# Patient Record
Sex: Female | Born: 1935
Health system: Southern US, Community
[De-identification: ages and names within clinical notes are randomized; demographics above are authoritative.]

## PROBLEM LIST (undated history)

## (undated) DIAGNOSIS — M5136 Other intervertebral disc degeneration, lumbar region: Secondary | ICD-10-CM

## (undated) DIAGNOSIS — E785 Hyperlipidemia, unspecified: Secondary | ICD-10-CM

## (undated) DIAGNOSIS — K8020 Calculus of gallbladder without cholecystitis without obstruction: Secondary | ICD-10-CM

## (undated) DIAGNOSIS — K5792 Diverticulitis of intestine, part unspecified, without perforation or abscess without bleeding: Secondary | ICD-10-CM

## (undated) DIAGNOSIS — S62101A Fracture of unspecified carpal bone, right wrist, initial encounter for closed fracture: Secondary | ICD-10-CM

## (undated) DIAGNOSIS — B029 Zoster without complications: Secondary | ICD-10-CM

## (undated) DIAGNOSIS — M7120 Synovial cyst of popliteal space [Baker], unspecified knee: Secondary | ICD-10-CM

## (undated) DIAGNOSIS — M1990 Unspecified osteoarthritis, unspecified site: Secondary | ICD-10-CM

## (undated) DIAGNOSIS — K219 Gastro-esophageal reflux disease without esophagitis: Secondary | ICD-10-CM

## (undated) DIAGNOSIS — N390 Urinary tract infection, site not specified: Secondary | ICD-10-CM

## (undated) DIAGNOSIS — M51369 Other intervertebral disc degeneration, lumbar region without mention of lumbar back pain or lower extremity pain: Secondary | ICD-10-CM

## (undated) HISTORY — DX: Gastro-esophageal reflux disease without esophagitis: K21.9

## (undated) HISTORY — PX: BREAST SURGERY: SHX581

## (undated) HISTORY — PX: PARTIAL HYSTERECTOMY: SHX80

## (undated) HISTORY — PX: TRIGGER FINGER RELEASE: SHX641

## (undated) HISTORY — DX: Fracture of unspecified carpal bone, right wrist, initial encounter for closed fracture: S62.101A

## (undated) HISTORY — DX: Hyperlipidemia, unspecified: E78.5

## (undated) HISTORY — DX: Synovial cyst of popliteal space (Baker), unspecified knee: M71.20

## (undated) HISTORY — DX: Other intervertebral disc degeneration, lumbar region: M51.36

## (undated) HISTORY — DX: Unspecified osteoarthritis, unspecified site: M19.90

## (undated) HISTORY — DX: Calculus of gallbladder without cholecystitis without obstruction: K80.20

## (undated) HISTORY — PX: APPENDECTOMY: SHX54

## (undated) HISTORY — PX: OTHER SURGICAL HISTORY: SHX169

## (undated) HISTORY — DX: Other intervertebral disc degeneration, lumbar region without mention of lumbar back pain or lower extremity pain: M51.369

## (undated) HISTORY — DX: Diverticulitis of intestine, part unspecified, without perforation or abscess without bleeding: K57.92

## (undated) HISTORY — DX: Urinary tract infection, site not specified: N39.0

## (undated) HISTORY — DX: Zoster without complications: B02.9

---

## 2000-01-20 ENCOUNTER — Encounter: Admission: RE | Admit: 2000-01-20 | Discharge: 2000-01-20 | Payer: Self-pay | Admitting: Family Medicine

## 2000-01-20 ENCOUNTER — Encounter: Payer: Self-pay | Admitting: Family Medicine

## 2000-10-07 ENCOUNTER — Emergency Department (HOSPITAL_COMMUNITY): Admission: EM | Admit: 2000-10-07 | Discharge: 2000-10-07 | Payer: Self-pay | Admitting: Emergency Medicine

## 2003-05-03 ENCOUNTER — Encounter: Admission: RE | Admit: 2003-05-03 | Discharge: 2003-05-03 | Payer: Self-pay | Admitting: Family Medicine

## 2004-05-01 ENCOUNTER — Ambulatory Visit: Payer: Self-pay | Admitting: Family Medicine

## 2004-05-13 ENCOUNTER — Ambulatory Visit: Payer: Self-pay | Admitting: Family Medicine

## 2004-06-30 ENCOUNTER — Ambulatory Visit: Payer: Self-pay | Admitting: Family Medicine

## 2004-12-30 ENCOUNTER — Ambulatory Visit: Payer: Self-pay | Admitting: Family Medicine

## 2005-05-06 ENCOUNTER — Encounter: Payer: Self-pay | Admitting: Family Medicine

## 2005-05-06 LAB — CONVERTED CEMR LAB

## 2005-05-31 ENCOUNTER — Ambulatory Visit: Payer: Self-pay | Admitting: Family Medicine

## 2005-06-09 ENCOUNTER — Ambulatory Visit: Payer: Self-pay | Admitting: Family Medicine

## 2005-06-30 ENCOUNTER — Ambulatory Visit: Payer: Self-pay | Admitting: Family Medicine

## 2005-12-09 ENCOUNTER — Ambulatory Visit: Payer: Self-pay | Admitting: Family Medicine

## 2005-12-29 ENCOUNTER — Ambulatory Visit: Payer: Self-pay | Admitting: Family Medicine

## 2006-03-08 DIAGNOSIS — S62101A Fracture of unspecified carpal bone, right wrist, initial encounter for closed fracture: Secondary | ICD-10-CM

## 2006-03-08 HISTORY — DX: Fracture of unspecified carpal bone, right wrist, initial encounter for closed fracture: S62.101A

## 2006-04-06 ENCOUNTER — Ambulatory Visit: Payer: Self-pay | Admitting: Family Medicine

## 2006-04-07 ENCOUNTER — Encounter: Payer: Self-pay | Admitting: Family Medicine

## 2006-05-18 ENCOUNTER — Ambulatory Visit: Payer: Self-pay | Admitting: Family Medicine

## 2006-06-01 ENCOUNTER — Ambulatory Visit: Payer: Self-pay | Admitting: Family Medicine

## 2006-06-01 LAB — CONVERTED CEMR LAB
ALT: 12 units/L (ref 0–40)
Albumin: 3.7 g/dL (ref 3.5–5.2)
BUN: 14 mg/dL (ref 6–23)
Creatinine, Ser: 0.7 mg/dL (ref 0.4–1.2)
GFR calc Af Amer: 102 mL/min
GFR calc non Af Amer: 84 mL/min
Phosphorus: 3.6 mg/dL (ref 2.3–4.6)
Triglycerides: 208 mg/dL (ref 0–149)
VLDL: 42 mg/dL — ABNORMAL HIGH (ref 0–40)

## 2006-06-07 ENCOUNTER — Ambulatory Visit: Payer: Self-pay | Admitting: Internal Medicine

## 2006-07-19 ENCOUNTER — Encounter: Payer: Self-pay | Admitting: Family Medicine

## 2006-07-19 DIAGNOSIS — K219 Gastro-esophageal reflux disease without esophagitis: Secondary | ICD-10-CM

## 2006-07-19 DIAGNOSIS — E7800 Pure hypercholesterolemia, unspecified: Secondary | ICD-10-CM | POA: Insufficient documentation

## 2006-07-19 DIAGNOSIS — M810 Age-related osteoporosis without current pathological fracture: Secondary | ICD-10-CM

## 2006-07-19 DIAGNOSIS — M7120 Synovial cyst of popliteal space [Baker], unspecified knee: Secondary | ICD-10-CM | POA: Insufficient documentation

## 2006-07-19 DIAGNOSIS — Z872 Personal history of diseases of the skin and subcutaneous tissue: Secondary | ICD-10-CM | POA: Insufficient documentation

## 2006-07-27 ENCOUNTER — Ambulatory Visit: Payer: Self-pay | Admitting: Family Medicine

## 2006-08-08 ENCOUNTER — Ambulatory Visit: Payer: Self-pay | Admitting: Family Medicine

## 2006-08-10 ENCOUNTER — Encounter (INDEPENDENT_AMBULATORY_CARE_PROVIDER_SITE_OTHER): Payer: Self-pay | Admitting: *Deleted

## 2006-12-05 ENCOUNTER — Ambulatory Visit: Payer: Self-pay | Admitting: Family Medicine

## 2006-12-06 LAB — CONVERTED CEMR LAB
BUN: 14 mg/dL (ref 6–23)
Cholesterol: 143 mg/dL (ref 0–200)
HDL: 31.6 mg/dL — ABNORMAL LOW (ref >39.0)
Total CHOL/HDL Ratio: 4.5
Triglycerides: 207 mg/dL (ref 0–149)
VLDL: 41 mg/dL — ABNORMAL HIGH (ref 0–40)

## 2007-02-15 ENCOUNTER — Ambulatory Visit: Payer: Self-pay | Admitting: Internal Medicine

## 2007-02-15 LAB — CONVERTED CEMR LAB
Glucose, Urine, Semiquant: NEGATIVE
Ketones, urine, test strip: NEGATIVE
Nitrite: POSITIVE
Protein, U semiquant: NEGATIVE

## 2007-02-16 ENCOUNTER — Encounter: Payer: Self-pay | Admitting: Internal Medicine

## 2007-03-09 DIAGNOSIS — K5792 Diverticulitis of intestine, part unspecified, without perforation or abscess without bleeding: Secondary | ICD-10-CM

## 2007-03-09 DIAGNOSIS — K8020 Calculus of gallbladder without cholecystitis without obstruction: Secondary | ICD-10-CM

## 2007-03-09 HISTORY — DX: Diverticulitis of intestine, part unspecified, without perforation or abscess without bleeding: K57.92

## 2007-03-09 HISTORY — DX: Calculus of gallbladder without cholecystitis without obstruction: K80.20

## 2007-04-02 ENCOUNTER — Encounter: Payer: Self-pay | Admitting: Family Medicine

## 2007-04-02 ENCOUNTER — Emergency Department (HOSPITAL_COMMUNITY): Admission: EM | Admit: 2007-04-02 | Discharge: 2007-04-03 | Payer: Self-pay | Admitting: Emergency Medicine

## 2007-04-10 ENCOUNTER — Ambulatory Visit: Payer: Self-pay | Admitting: Family Medicine

## 2007-04-10 DIAGNOSIS — K8020 Calculus of gallbladder without cholecystitis without obstruction: Secondary | ICD-10-CM | POA: Insufficient documentation

## 2007-04-10 DIAGNOSIS — K5730 Diverticulosis of large intestine without perforation or abscess without bleeding: Secondary | ICD-10-CM | POA: Insufficient documentation

## 2007-04-11 LAB — CONVERTED CEMR LAB
Basophils Relative: 0 % (ref 0.0–1.0)
Eosinophils Absolute: 0.1 10*3/uL (ref 0.0–0.6)
Eosinophils Relative: 0.9 % (ref 0.0–5.0)
HCT: 39.5 % (ref 36.0–46.0)
Hemoglobin: 12.8 g/dL (ref 12.0–15.0)
Lymphocytes Relative: 19.4 % (ref 12.0–46.0)
MCV: 93.9 fL (ref 78.0–100.0)
Neutro Abs: 6 10*3/uL (ref 1.4–7.7)
Neutrophils Relative %: 70.8 % (ref 43.0–77.0)
WBC: 8.4 10*3/uL (ref 4.5–10.5)

## 2007-09-21 ENCOUNTER — Ambulatory Visit: Payer: Self-pay | Admitting: Family Medicine

## 2007-09-21 LAB — CONVERTED CEMR LAB
Nitrite: POSITIVE
Protein, U semiquant: NEGATIVE
Urobilinogen, UA: 0.2

## 2007-09-25 LAB — CONVERTED CEMR LAB
Albumin: 4 g/dL (ref 3.5–5.2)
Alkaline Phosphatase: 56 units/L (ref 39–117)
CO2: 28 meq/L (ref 19–32)
Chloride: 102 meq/L (ref 96–112)
Cholesterol: 164 mg/dL (ref 0–200)
Creatinine, Ser: 0.8 mg/dL (ref 0.4–1.2)
GFR calc Af Amer: 87 mL/min
GFR calc non Af Amer: 72 mL/min
LDL Cholesterol: 96 mg/dL (ref 0–99)
Potassium: 4.4 meq/L (ref 3.5–5.1)
TSH: 1.2 microintl units/mL (ref 0.35–5.50)
Total CHOL/HDL Ratio: 4.9
Total Protein: 6.8 g/dL (ref 6.0–8.3)
Triglycerides: 175 mg/dL — ABNORMAL HIGH (ref 0–149)
VLDL: 35 mg/dL (ref 0–40)
Vit D, 1,25-Dihydroxy: 33 (ref 30–89)

## 2007-10-02 ENCOUNTER — Encounter: Admission: RE | Admit: 2007-10-02 | Discharge: 2007-10-02 | Payer: Self-pay | Admitting: Family Medicine

## 2008-04-23 ENCOUNTER — Ambulatory Visit: Payer: Self-pay | Admitting: Family Medicine

## 2008-04-26 ENCOUNTER — Encounter (INDEPENDENT_AMBULATORY_CARE_PROVIDER_SITE_OTHER): Payer: Self-pay | Admitting: *Deleted

## 2008-04-26 LAB — CONVERTED CEMR LAB
Albumin: 3.9 g/dL (ref 3.5–5.2)
Basophils Absolute: 0 10*3/uL (ref 0.0–0.1)
Calcium: 9.5 mg/dL (ref 8.4–10.5)
Cholesterol: 186 mg/dL (ref 0–200)
Eosinophils Absolute: 0.1 10*3/uL (ref 0.0–0.7)
GFR calc non Af Amer: 72 mL/min
Glucose, Bld: 94 mg/dL (ref 70–99)
HCT: 38.7 % (ref 36.0–46.0)
Hemoglobin: 13.2 g/dL (ref 12.0–15.0)
MCHC: 34.2 g/dL (ref 30.0–36.0)
MCV: 93.5 fL (ref 78.0–100.0)
Monocytes Absolute: 0.5 10*3/uL (ref 0.1–1.0)
Monocytes Relative: 7.6 % (ref 3.0–12.0)
Neutro Abs: 4.2 10*3/uL (ref 1.4–7.7)
Phosphorus: 3.7 mg/dL (ref 2.3–4.6)
Platelets: 185 10*3/uL (ref 150–400)
Potassium: 4.6 meq/L (ref 3.5–5.1)
RDW: 11.7 % (ref 11.5–14.6)
Sodium: 144 meq/L (ref 135–145)
Triglycerides: 223 mg/dL (ref 0–149)
Vit D, 25-Hydroxy: 28 ng/mL — ABNORMAL LOW (ref 30–89)

## 2008-07-08 ENCOUNTER — Ambulatory Visit: Payer: Self-pay | Admitting: Family Medicine

## 2008-10-02 ENCOUNTER — Ambulatory Visit: Payer: Self-pay | Admitting: Family Medicine

## 2008-10-02 DIAGNOSIS — I10 Essential (primary) hypertension: Secondary | ICD-10-CM

## 2008-10-03 LAB — CONVERTED CEMR LAB
ALT: 11 units/L (ref 0–35)
BUN: 14 mg/dL (ref 6–23)
CO2: 31 meq/L (ref 19–32)
Calcium: 9.1 mg/dL (ref 8.4–10.5)
Creatinine, Ser: 0.7 mg/dL (ref 0.4–1.2)
Glucose, Bld: 110 mg/dL — ABNORMAL HIGH (ref 70–99)
HDL: 37.1 mg/dL — ABNORMAL LOW (ref >39.00)
Total CHOL/HDL Ratio: 4
Triglycerides: 192 mg/dL — ABNORMAL HIGH (ref 0.0–149.0)
VLDL: 38.4 mg/dL (ref 0.0–40.0)

## 2008-10-08 ENCOUNTER — Ambulatory Visit: Payer: Self-pay | Admitting: Family Medicine

## 2008-10-08 DIAGNOSIS — E559 Vitamin D deficiency, unspecified: Secondary | ICD-10-CM

## 2008-12-02 ENCOUNTER — Ambulatory Visit: Payer: Self-pay | Admitting: Family Medicine

## 2008-12-02 LAB — CONVERTED CEMR LAB
Bilirubin Urine: NEGATIVE
Epithelial cells, urine: 1 /lpf
Ketones, urine, test strip: NEGATIVE
RBC / HPF: 0
Urobilinogen, UA: 0.2

## 2009-01-17 ENCOUNTER — Ambulatory Visit: Payer: Self-pay | Admitting: Family Medicine

## 2009-01-17 LAB — CONVERTED CEMR LAB
Nitrite: NEGATIVE
Urobilinogen, UA: 0.2
pH: 6

## 2009-03-25 ENCOUNTER — Ambulatory Visit: Payer: Self-pay | Admitting: Family Medicine

## 2009-03-25 LAB — CONVERTED CEMR LAB
Glucose, Urine, Semiquant: NEGATIVE
Ketones, urine, test strip: NEGATIVE
Urobilinogen, UA: 0.2
pH: 6

## 2009-03-26 ENCOUNTER — Encounter: Payer: Self-pay | Admitting: Family Medicine

## 2009-03-27 DIAGNOSIS — N390 Urinary tract infection, site not specified: Secondary | ICD-10-CM

## 2009-04-03 ENCOUNTER — Ambulatory Visit: Payer: Self-pay | Admitting: Family Medicine

## 2009-04-04 ENCOUNTER — Telehealth: Payer: Self-pay | Admitting: Family Medicine

## 2009-04-04 LAB — CONVERTED CEMR LAB
ALT: 13 units/L (ref 0–35)
AST: 18 units/L (ref 0–37)
BUN: 13 mg/dL (ref 6–23)
CO2: 30 meq/L (ref 19–32)
Chloride: 105 meq/L (ref 96–112)
GFR calc non Af Amer: 71.54 mL/min (ref >60)
Total CHOL/HDL Ratio: 4
Triglycerides: 183 mg/dL — ABNORMAL HIGH (ref 0.0–149.0)
Vit D, 25-Hydroxy: 29 ng/mL — ABNORMAL LOW (ref 30–89)

## 2009-04-15 ENCOUNTER — Encounter: Payer: Self-pay | Admitting: Family Medicine

## 2009-04-30 ENCOUNTER — Ambulatory Visit: Payer: Self-pay | Admitting: Family Medicine

## 2009-06-16 ENCOUNTER — Encounter: Payer: Self-pay | Admitting: Family Medicine

## 2009-10-14 ENCOUNTER — Ambulatory Visit: Payer: Self-pay | Admitting: Family Medicine

## 2009-10-17 ENCOUNTER — Encounter: Payer: Self-pay | Admitting: Family Medicine

## 2009-10-17 LAB — CONVERTED CEMR LAB
AST: 15 units/L (ref 0–37)
Albumin: 4.1 g/dL (ref 3.5–5.2)
Basophils Relative: 0.5 % (ref 0.0–3.0)
CO2: 27 meq/L (ref 19–32)
Calcium: 9.5 mg/dL (ref 8.4–10.5)
Chloride: 102 meq/L (ref 96–112)
Eosinophils Absolute: 0.1 10*3/uL (ref 0.0–0.7)
HDL: 36.9 mg/dL — ABNORMAL LOW (ref >39.00)
Hemoglobin: 12.4 g/dL (ref 12.0–15.0)
MCHC: 33.9 g/dL (ref 30.0–36.0)
MCV: 94 fL (ref 78.0–100.0)
Monocytes Absolute: 0.5 10*3/uL (ref 0.1–1.0)
Neutro Abs: 4.9 10*3/uL (ref 1.4–7.7)
Potassium: 4.5 meq/L (ref 3.5–5.1)
RBC: 3.88 M/uL (ref 3.87–5.11)
Vit D, 25-Hydroxy: 29 ng/mL — ABNORMAL LOW (ref 30–89)

## 2009-10-20 ENCOUNTER — Encounter: Payer: Self-pay | Admitting: Family Medicine

## 2009-12-17 ENCOUNTER — Ambulatory Visit: Payer: Self-pay | Admitting: Family Medicine

## 2009-12-24 LAB — CONVERTED CEMR LAB: Vit D, 25-Hydroxy: 46 ng/mL (ref 30–89)

## 2010-01-16 ENCOUNTER — Ambulatory Visit: Payer: Self-pay | Admitting: Family Medicine

## 2010-01-16 DIAGNOSIS — L299 Pruritus, unspecified: Secondary | ICD-10-CM | POA: Insufficient documentation

## 2010-01-19 LAB — CONVERTED CEMR LAB
AST: 17 units/L (ref 0–37)
Albumin: 4.1 g/dL (ref 3.5–5.2)
Alkaline Phosphatase: 58 units/L (ref 39–117)
Bilirubin, Direct: 0.1 mg/dL (ref 0.0–0.3)
CO2: 30 meq/L (ref 19–32)
GFR calc non Af Amer: 55.85 mL/min (ref >60)
Glucose, Bld: 94 mg/dL (ref 70–99)
Potassium: 4.2 meq/L (ref 3.5–5.1)
Sodium: 140 meq/L (ref 135–145)

## 2010-03-08 HISTORY — PX: CHOLECYSTECTOMY: SHX55

## 2010-04-07 NOTE — Assessment & Plan Note (Signed)
Summary: itching all over/alc   Vital Signs:  Patient profile:   77 year old female Weight:      141.75 pounds Temp:     98.1 degrees F oral Pulse rate:   80 / minute Pulse rhythm:   regular BP sitting:   122 / 80  (left arm) Cuff size:   regular  Vitals Entered By: [name] CMA (AAMA) (January 16, 2010 11:13 AM) CC: Itching all over   History of Present Illness: CC: itching  3d h/o bad case of itching.  Itching all over, when scratches skin turns red.  Has had nettle rash in past (after something she ate).  Eating tomaoes, yogurt, apples, bananas.  Took tylenol allergy med last night and slept better.  took cortisone ointment as well which helped.  Took hot shower this morning, hasn't itched as bad since.  itching was arms, legs, back, chest, and abd.  spared face, feet, fingers  Not outside in poison ivy recently.  No new lotions, detergents, soaps.  Simvastatin 80mg  every other day for long time.  Current Medications (verified): 1)  Zocor 80 Mg Tabs (Simvastatin) .... Take One By Mouth Daily 2)  Aspirin 81 Mg Tbec (Aspirin) .... Take One By Mouth Daily 3)  Tylenol Extra Strength 500 Mg Tabs (Acetaminophen) .... Take By Mouth As Directed As Needed 4)  Optivar 0.05 %  Soln (Azelastine Hcl) .... One Drop Each Eye Twice A Day 5)  Vitamin D 2000 Unit Tabs (Cholecalciferol) .... Take 1 Tablet By Mouth Daily  Allergies: 1)  Penicillin 2)  Sulfa 3)  Quinine 4)  Fosamax  Past History:  Past Medical History: Last updated: 04/10/2007 GERD Osteoarthritis- hands, knees Osteoporosis Rheumatoid arthritis- ? fx wrist R 08 diverticulitis 09 gallstones (incidental) 09  Past Surgical History: Last updated: 04/10/2007 appy R wrist fx 08- no surgery CT abd /pelvis gallstones and sigmoid diverticulitis 1/09  Social History: Last updated: 09/21/2007 Marital Status: married  Children:  Occupation:  non smoker no alcohol  Review of Systems       per HPI  Physical  Exam  General:  well appearing elderly female Mouth:  pharynx pink and moist.   Abdomen:  abdomen soft and non-tender without masses, organomegaly or hernias noted. no renal bruits  Skin:  pruritic macular erythematous rash on back from iliac crest to braline.  + pruritic hive R lateral thigh   Impression & Recommendations:  Problem # 1:  PRURITUS (ICD-698.9) ? contact rash vs systemic.  check liver and T bili as on zocor.  treat with allergy med (claritin/zyrtec) and benadryl as needed,continue topical steroid.  rtc if not improving.   doubt xerosis as improved after hot shower.  thyroid normal 10/2009.  no changes of lichen simplex chornicus  Orders: TLB-BMP (Basic Metabolic Panel-BMET) (80048-METABOL) TLB-Hepatic/Liver Function Pnl (80076-HEPATIC)  Complete Medication List: 1)  Zocor 80 Mg Tabs (Simvastatin) .... Take one by mouth daily 2)  Aspirin 81 Mg Tbec (Aspirin) .... Take one by mouth daily 3)  Tylenol Extra Strength 500 Mg Tabs (Acetaminophen) .... Take by mouth as directed as needed 4)  Optivar 0.05 % Soln (Azelastine hcl) .... One drop each eye twice a day 5)  Vitamin D 2000 Unit Tabs (Cholecalciferol) .... Take 1 tablet by mouth daily  Patient Instructions: 1)  Continue tyelnol allergy, try benadryl as well as cortisone topical. 2)  Check liver function again today. 3)  Continue hot shower as long as it's not too drying.   Orders  Added: 1)  TLB-BMP (Basic Metabolic Panel-BMET) [80048-METABOL] 2)  TLB-Hepatic/Liver Function Pnl [80076-HEPATIC] 3)  Est. Patient Level III [16109]    Current Allergies (reviewed today): PENICILLIN SULFA QUININE FOSAMAX

## 2010-04-07 NOTE — Assessment & Plan Note (Signed)
Summary: 4:15 ?UTI/CLE   Vital Signs:  Patient profile:   77 year old female Weight:      144 pounds Temp:     97.9 degrees F oral Pulse rate:   68 / minute Pulse rhythm:   regular BP sitting:   124 / 70  (left arm) Cuff size:   regular  Vitals Entered By: Lowella Petties CMA (March 25, 2009 4:36 PM) CC: ? UTI   History of Present Illness: thinks she had uti had upset stomach and diarrhea yesterday eve-- with many bms  used a bedside commode  urine was very smelly and looked somewhat red -- got better as the day goes on  mild low back pain  no burning / but has some frequency  last night - brief nausea but no vomiting  no more diarrhea today   now is trying to make herself drink more water   Allergies: 1)  Penicillin 2)  Sulfa 3)  Quinine 4)  Fosamax  Past History:  Past Medical History: Last updated: 05-May-2007 GERD Osteoarthritis- hands, knees Osteoporosis Rheumatoid arthritis- ? fx wrist R 08 diverticulitis 09 gallstones (incidental) 09  Past Surgical History: Last updated: 05-05-2007 appy R wrist fx 08- no surgery CT abd /pelvis gallstones and sigmoid diverticulitis 1/09  Family History: Last updated: May 05, 2007 Father: died of CAD at 2 Mother:  Siblings:  2 B died of CAD sister diverticulitis  Social History: Last updated: 09/21/2007 Marital Status: married  Children:  Occupation:  non smoker no alcohol  Risk Factors: Smoking Status: never (07/19/2006)  Review of Systems General:  Complains of fatigue; denies fever, loss of appetite, and malaise. Eyes:  Denies blurring. CV:  Denies chest pain or discomfort and palpitations. Resp:  Denies cough and wheezing. GI:  Denies bloody stools, dark tarry stools, indigestion, and vomiting. GU:  Complains of urinary frequency; denies hematuria. MS:  Complains of low back pain. Derm:  Denies itching, lesion(s), and rash.  Physical Exam  General:  Well-developed,well-nourished,in no acute  distress; alert,appropriate and cooperative throughout examination Head:  normocephalic, atraumatic, and no abnormalities observed.   Mouth:  pharynx pink and moist.   Neck:  No deformities, masses, or tenderness noted. Lungs:  Normal respiratory effort, chest expands symmetrically. Lungs are clear to auscultation, no crackles or wheezes. Heart:  Normal rate and regular rhythm. S1 and S2 normal without gallop, murmur, click, rub or other extra sounds. Abdomen:  no CVA or suprapubic tendernes to palp Msk:  no CVA tenderness  Extremities:  No clubbing, cyanosis, edema, or deformity noted with normal full range of motion of all joints.   Skin:  Intact without suspicious lesions or rashes Cervical Nodes:  No lymphadenopathy noted Inguinal Nodes:  No significant adenopathy Psych:  normal affect, talkative and pleasant    Impression & Recommendations:  Problem # 1:  UTI (ICD-599.0) Assessment Deteriorated urine infection symptoms- this seems recurrent -- 3rd one in 4 mo  disc imp water intake  tx with cipro  pend cx may consider urol eval  The following medications were removed from the medication list:    Keflex 500 Mg Caps (Cephalexin) .Marland Kitchen... Take 1 two times a day for uti Her updated medication list for this problem includes:    Cipro 250 Mg Tabs (Ciprofloxacin hcl) .Marland Kitchen... 1 by mouth two times a day for 7 days  Orders: T-Culture, Urine (37628-31517) UA Dipstick W/ Micro (manual) (61607)  Complete Medication List: 1)  Zocor 80 Mg Tabs (Simvastatin) .... Take one by  mouth daily 2)  Aspirin 81 Mg Tbec (Aspirin) .... Take one by mouth daily 3)  One-a-day Womens Tabs (Multiple vitamins-calcium) .... Take 1 tablet by mouth once a day 4)  Tylenol Extra Strength 500 Mg Tabs (Acetaminophen) .... Take by mouth as directed as needed 5)  Optivar 0.05 % Soln (Azelastine hcl) .... One drop each eye twice a day 6)  Vitamin D 2000 Unit Tabs (Cholecalciferol) .... Take 1 tablet by mouth once a  day 7)  Caltrate  .... As directed 8)  Cipro 250 Mg Tabs (Ciprofloxacin hcl) .Marland Kitchen.. 1 by mouth two times a day for 7 days  Patient Instructions: 1)  drink lots of water  2)  start cipro today -- first dose when you get it  3)  continue drinking lots of water 4)  call or seek care is symptoms don't improve in 2-3 days or if you develop back pain, nausea, or vomiting  5)  I will update you when urine culture returns 6)  I may refer you to a urologist for frequent infection  Prescriptions: CIPRO 250 MG TABS (CIPROFLOXACIN HCL) 1 by mouth two times a day for 7 days  #14 x 0   Entered and Authorized by:   Judith Part MD   Signed by:   Judith Part MD on 03/25/2009   Method used:   Print then Give to Patient   RxID:   [id number]   Prior Medications (reviewed today): ZOCOR 80 MG TABS (SIMVASTATIN) Take one by mouth daily ASPIRIN 81 MG TBEC (ASPIRIN) Take one by mouth daily ONE-A-DAY WOMENS  TABS (MULTIPLE VITAMINS-CALCIUM) Take 1 tablet by mouth once a day TYLENOL EXTRA STRENGTH 500 MG TABS (ACETAMINOPHEN) Take by mouth as directed as needed OPTIVAR 0.05 %  SOLN (AZELASTINE HCL) one drop each eye twice a day VITAMIN D 2000 UNIT TABS (CHOLECALCIFEROL) Take 1 tablet by mouth once a day CALTRATE () as directed CIPRO 250 MG TABS (CIPROFLOXACIN HCL) 1 by mouth two times a day for 7 days Current Allergies: PENICILLIN SULFA QUININE FOSAMAX  Laboratory Results   Urine Tests  Date/Time Received: March 25, 2009 4:36 PM  Date/Time Reported: March 25, 2009 4:36 PM   Routine Urinalysis   Color: yellow Appearance: Clear Glucose: negative   (Normal Range: Negative) Bilirubin: negative   (Normal Range: Negative) Ketone: negative   (Normal Range: Negative) Spec. Gravity: <1.005   (Normal Range: 1.003-1.035) Blood: trace-intact   (Normal Range: Negative) pH: 6.0   (Normal Range: 5.0-8.0) Protein: trace   (Normal Range: Negative) Urobilinogen: 0.2   (Normal Range:  0-1) Nitrite: negative   (Normal Range: Negative) Leukocyte Esterace: trace   (Normal Range: Negative)

## 2010-04-07 NOTE — Miscellaneous (Signed)
Summary: med list update  Medications Added VITAMIN D 2000 UNIT TABS (CHOLECALCIFEROL) Take 1 tablet by mouth twice a day       Clinical Lists Changes  Medications: Changed medication from VITAMIN D 2000 UNIT TABS (CHOLECALCIFEROL) Take 1 tablet by mouth once a day to VITAMIN D 2000 UNIT TABS (CHOLECALCIFEROL) Take 1 tablet by mouth twice a day     Current Allergies: PENICILLIN SULFA QUININE FOSAMAX

## 2010-04-07 NOTE — Progress Notes (Signed)
Summary: regarding vitamin d  Phone Note Outgoing Call   Summary of Call: Advised pt of vit d results, she says she is taking 500 units two times a day.  Initial call taken by: Lowella Petties CMA,  April 04, 2009 3:22 PM  Follow-up for Phone Call        thanks for the update - I want her to increase to 1000 international units two times a day -- thanks Follow-up by: Judith Part MD,  April 04, 2009 4:33 PM  Additional Follow-up for Phone Call Additional follow up Details #1::        Advised pt. Additional Follow-up by: Lowella Petties CMA,  April 07, 2009 9:49 AM

## 2010-04-07 NOTE — Assessment & Plan Note (Signed)
Summary: CPX   Vital Signs:  Patient profile:   77 year old female Height:      60.5 inches Weight:      140.50 pounds BMI:     27.09 Temp:     98.5 degrees F oral Pulse rate:   72 / minute Pulse rhythm:   regular BP sitting:   120 / 82  (left arm) Cuff size:   regular  Vitals Entered By: Linde Gillis CMA Duncan Dull) (October 14, 2009 10:37 AM) CC: 30 minute exam   History of Present Illness: here for check up of her chronic med problems   has been feeling ok and keeping busy  works at her own pace   wt is down 4 lb eating a little less - and drinks a lot of water   bp is 120/82  OP dexa 09 on ca and D last D level 29 in jan - needs to re check is on 2000 international units per day now  does not want further dexas at this point   decline mam and pap and breast exam  no lumps on self exam  Td 04 ? pneumovax-not interested  ? shingles vaccine -- not interested   lipids were good in jan with LDL in low 100s - on zocor    Contraindications/Deferment of Procedures/Staging:    Test/Procedure: Pneumovax vaccine    Reason for deferment: patient declined     Test/Procedure: Zoster vaccine    Reason for deferment: declined   Allergies: 1)  Penicillin 2)  Sulfa 3)  Quinine 4)  Fosamax  Past History:  Past Medical History: Last updated: 2007-04-22 GERD Osteoarthritis- hands, knees Osteoporosis Rheumatoid arthritis- ? fx wrist R 08 diverticulitis 09 gallstones (incidental) 09  Past Surgical History: Last updated: 04/22/07 appy R wrist fx 08- no surgery CT abd /pelvis gallstones and sigmoid diverticulitis 1/09  Family History: Last updated: 04/22/07 Father: died of CAD at 57 Mother:  Siblings:  2 B died of CAD sister diverticulitis  Social History: Last updated: 09/21/2007 Marital Status: married  Children:  Occupation:  non smoker no alcohol  Risk Factors: Smoking Status: never (07/19/2006)  Review of Systems General:  Denies  fatigue and malaise. Eyes:  Denies blurring and eye irritation. CV:  Denies chest pain or discomfort, lightheadness, and palpitations. Resp:  Denies cough, shortness of breath, and wheezing. GI:  Denies abdominal pain, change in bowel habits, indigestion, and nausea. GU:  Denies abnormal vaginal bleeding, discharge, and dysuria. MS:  Complains of joint pain and stiffness; denies cramps and muscle weakness. Derm:  Denies itching, lesion(s), poor wound healing, and rash. Neuro:  Denies memory loss, numbness, tingling, and weakness. Psych:  Denies depression. Endo:  Denies cold intolerance and excessive thirst. Heme:  Denies abnormal bruising and bleeding.  Physical Exam  General:  well appearing elderly female with runny nose  Head:  normocephalic, atraumatic, and no abnormalities observed.   Eyes:  vision grossly intact, pupils equal, pupils round, and pupils reactive to light.  no conjunctival pallor, injection or icterus  Ears:  R ear normal and L ear normal.   Nose:  no nasal discharge.   Mouth:  pharynx pink and moist.   Neck:  supple with full rom and no masses or thyromegally, no JVD or carotid bruit  Chest Wall:  No deformities, masses, or tenderness noted. Lungs:  Normal respiratory effort, chest expands symmetrically. Lungs are clear to auscultation, no crackles or wheezes. Heart:  Normal rate and regular rhythm.  S1 and S2 normal without gallop, murmur, click, rub or other extra sounds. Abdomen:  Bowel sounds positive,abdomen soft and non-tender without masses, organomegaly or hernias noted. no renal bruits  Msk:  No deformity or scoliosis noted of thoracic or lumbar spine.  some changes of OA  Pulses:  R and L carotid,radial,femoral,dorsalis pedis and posterior tibial pulses are full and equal bilaterally Extremities:  No clubbing, cyanosis, edema, or deformity noted with normal full range of motion of all joints.   Neurologic:  sensation intact to light touch, gait normal, and  DTRs symmetrical and normal.  no tremor  Skin:  Intact without suspicious lesions or rashes Cervical Nodes:  No lymphadenopathy noted Inguinal Nodes:  No significant adenopathy Psych:  normal affect, talkative and pleasant    Impression & Recommendations:  Problem # 1:  UNSPECIFIED VITAMIN D DEFICIENCY (ICD-268.9) Assessment Unchanged check D level now on 2000 international units per day Orders: Venipuncture (16109) TLB-Lipid Panel (80061-LIPID) TLB-Renal Function Panel (80069-RENAL) TLB-CBC Platelet - w/Differential (85025-CBCD) TLB-TSH (Thyroid Stimulating Hormone) (84443-TSH) TLB-ALT (SGPT) (84460-ALT) TLB-AST (SGOT) (84450-SGOT) T-Vitamin D (25-Hydroxy) (60454-09811) Specimen Handling (91478)  Problem # 2:  ESSENTIAL HYPERTENSION, BENIGN (ICD-401.1) Assessment: Unchanged  good control without meds at this time  urged to continue low sodium diet and stay active Orders: Venipuncture (29562) TLB-Lipid Panel (80061-LIPID) TLB-Renal Function Panel (80069-RENAL) TLB-CBC Platelet - w/Differential (85025-CBCD) TLB-TSH (Thyroid Stimulating Hormone) (84443-TSH) TLB-ALT (SGPT) (84460-ALT) TLB-AST (SGOT) (84450-SGOT)  BP today: 120/82 Prior BP: 108/68 (04/30/2009)  Labs Reviewed: K+: 4.4 (04/03/2009) Creat: : 0.8 (04/03/2009)   Chol: 185 (04/03/2009)   HDL: 47.10 (04/03/2009)   LDL: 101 (04/03/2009)   TG: 183.0 (04/03/2009)  Problem # 3:  Hx of HYPERCHOLESTEROLEMIA (ICD-272.0) Assessment: Unchanged  check lab today on zocor  rev low sat fat diet- doing well  Her updated medication list for this problem includes:    Zocor 80 Mg Tabs (Simvastatin) .Marland Kitchen... Take one by mouth daily  Orders: Venipuncture (13086) TLB-Lipid Panel (80061-LIPID) TLB-Renal Function Panel (80069-RENAL) TLB-CBC Platelet - w/Differential (85025-CBCD) TLB-TSH (Thyroid Stimulating Hormone) (84443-TSH) TLB-ALT (SGPT) (84460-ALT) TLB-AST (SGOT) (84450-SGOT)  Labs Reviewed: SGOT: 18 (04/03/2009)    SGPT: 13 (04/03/2009)   HDL:47.10 (04/03/2009), 37.10 (10/02/2008)  LDL:101 (04/03/2009), 77 (57/84/6962)  Chol:185 (04/03/2009), 152 (10/02/2008)  Trig:183.0 (04/03/2009), 192.0 (10/02/2008)  Problem # 4:  OSTEOPOROSIS (ICD-733.00) Assessment: Unchanged pt declines another dexa safety issues discussed rev ca and D req Her updated medication list for this problem includes:    Vitamin D 2000 Unit Tabs (Cholecalciferol) .Marland Kitchen... Take 1 tablet by mouth once a day  Orders: Venipuncture (95284) TLB-Lipid Panel (80061-LIPID) TLB-Renal Function Panel (80069-RENAL) TLB-CBC Platelet - w/Differential (85025-CBCD) TLB-TSH (Thyroid Stimulating Hormone) (84443-TSH) TLB-ALT (SGPT) (84460-ALT) TLB-AST (SGOT) (84450-SGOT) T-Vitamin D (25-Hydroxy) (13244-01027)  Problem # 5:  Preventive Health Care (ICD-V70.0) Assessment: Comment Only declines most prev care at her age including pneumovax and shingles vaccine  Complete Medication List: 1)  Zocor 80 Mg Tabs (Simvastatin) .... Take one by mouth daily 2)  Aspirin 81 Mg Tbec (Aspirin) .... Take one by mouth daily 3)  One-a-day Womens Tabs (Multiple vitamins-calcium) .... Take 1 tablet by mouth once a day 4)  Tylenol Extra Strength 500 Mg Tabs (Acetaminophen) .... Take by mouth as directed as needed 5)  Optivar 0.05 % Soln (Azelastine hcl) .... One drop each eye twice a day 6)  Vitamin D 2000 Unit Tabs (Cholecalciferol) .... Take 1 tablet by mouth once a day 7)  Caltrate  .... As directed  Patient  Instructions: 1)  labs today for wellness and vitamin D and cholesterol  2)  keep up the good healthy diet and stay active  3)  no change in medicines  Prescriptions: ZOCOR 80 MG TABS (SIMVASTATIN) Take one by mouth daily  #30 x 11   Entered and Authorized by:   Judith Part MD   Signed by:   Judith Part MD on 10/14/2009   Method used:   Print then Give to Patient   RxID:   [id number]   Current Allergies (reviewed  today): PENICILLIN SULFA QUININE FOSAMAX

## 2010-04-07 NOTE — Consult Note (Signed)
Summary: [facility]  [facility]   Imported By: Lanelle Bal 04/21/2009 08:08:48  _____________________________________________________________________  External Attachment:    Type:   Image     Comment:   External Document

## 2010-04-07 NOTE — Assessment & Plan Note (Signed)
Summary: CONGESTION/HEADACHE/DLO   Vital Signs:  Patient profile:   77 year old female Height:      60.5 inches Weight:      144.25 pounds Temp:     98.3 degrees F oral Pulse rate:   76 / minute Pulse rhythm:   regular BP sitting:   108 / 68  (left arm) Cuff size:   regular  Vitals Entered By: Lewanda Rife LPN (April 30, 2009 11:56 AM)  History of Present Illness: started getting sick on sat eve- sneeze and runny nose  no fever  now is miserable with head congestion and pain in her temples  nasal d/c is clear  no cough - but does sneeze a lot  dry mouth  vics 44 med helps   no n/v/d at all no appetite   is on macrobid from her urologist  has raw looking spot on throat -- is wondering about thrush   Allergies: 1)  Penicillin 2)  Sulfa 3)  Quinine 4)  Fosamax  Past History:  Past Medical History: Last updated: 04/18/07 GERD Osteoarthritis- hands, knees Osteoporosis Rheumatoid arthritis- ? fx wrist R 08 diverticulitis 09 gallstones (incidental) 09  Past Surgical History: Last updated: 2007/04/18 appy R wrist fx 08- no surgery CT abd /pelvis gallstones and sigmoid diverticulitis 1/09  Family History: Last updated: 2007-04-18 Father: died of CAD at 33 Mother:  Siblings:  2 B died of CAD sister diverticulitis  Social History: Last updated: 09/21/2007 Marital Status: married  Children:  Occupation:  non smoker no alcohol  Risk Factors: Smoking Status: never (07/19/2006)  Review of Systems General:  Complains of fatigue and malaise; denies chills and fever. Eyes:  Denies blurring, discharge, and eye irritation. CV:  Denies chest pain or discomfort and palpitations. Resp:  Complains of cough; denies pleuritic, shortness of breath, and wheezing. GI:  Denies abdominal pain, nausea, and vomiting. Derm:  Denies rash.  Physical Exam  General:  well appearing elderly female with runny nose  Head:  normocephalic, atraumatic, and no  abnormalities observed.  no sinus tenderness  Eyes:  vision grossly intact, pupils equal, pupils round, pupils reactive to light, and no injection.   Ears:  R ear normal and L ear normal.   Nose:  nares are injected with clear rhinorrhea bilat  Mouth:  pharynx pink and moist, no erythema, and no exudates.   .5 cm area of irritation on L side of tougue but no signs of thrush  Neck:  No deformities, masses, or tenderness noted. Lungs:  Normal respiratory effort, chest expands symmetrically. Lungs are clear to auscultation, no crackles or wheezes. Heart:  Normal rate and regular rhythm. S1 and S2 normal without gallop, murmur, click, rub or other extra sounds. Skin:  Intact without suspicious lesions or rashes Cervical Nodes:  No lymphadenopathy noted Psych:  normal affect, talkative and pleasant    Impression & Recommendations:  Problem # 1:  URI (ICD-465.9) Assessment New viral uri with nasal symptoms and no fever - relatively mild spot on tougue looks like irritation / possible burn or early apthous ulcer (not thrush) - recommended chloraseptic and observation for that  recommend sympt care- see pt instructions   pt advised to update me if symptoms worsen or do not improve - esp if worse facial pain or any cough Her updated medication list for this problem includes:    Aspirin 81 Mg Tbec (Aspirin) .Marland Kitchen... Take one by mouth daily    Tylenol Extra Strength 500 Mg Tabs (Acetaminophen) .Marland KitchenMarland KitchenMarland KitchenMarland Kitchen  Take by mouth as directed as needed  Complete Medication List: 1)  Zocor 80 Mg Tabs (Simvastatin) .... Take one by mouth daily 2)  Aspirin 81 Mg Tbec (Aspirin) .... Take one by mouth daily 3)  One-a-day Womens Tabs (Multiple vitamins-calcium) .... Take 1 tablet by mouth once a day 4)  Tylenol Extra Strength 500 Mg Tabs (Acetaminophen) .... Take by mouth as directed as needed 5)  Optivar 0.05 % Soln (Azelastine hcl) .... One drop each eye twice a day 6)  Vitamin D 2000 Unit Tabs (Cholecalciferol) ....  Take 1 tablet by mouth once a day 7)  Caltrate  .... As directed 8)  Nitrofurantoin Macrocrystal 50 Mg Caps (Nitrofurantoin macrocrystal) .... Take one capsule twice a day with meals  Patient Instructions: 1)  you can try mucinex over the counter twice daily as directed and nasal saline spray for congestion 2)  zyrtec is good to help runny nose and sneezing - this is over the counter also  3)  tylenol over the counter as directed may help with aches, headache and fever 4)  call if symptoms worsen or if not improved in 4-5 days  5)  if spot on your toungue does not get better update me 6)  if it is sore - use some chloraseptic spray on tougue as directed   Current Allergies (reviewed today): PENICILLIN SULFA QUININE FOSAMAX

## 2010-04-07 NOTE — Consult Note (Signed)
Summary: [facility]  [facility]   Imported By: Lanelle Bal 06/20/2009 14:19:15  _____________________________________________________________________  External Attachment:    Type:   Image     Comment:   External Document

## 2010-04-07 NOTE — Consult Note (Signed)
Summary: [facility]  [facility]   Imported By: Maryln Gottron 10/24/2009 13:42:00  _____________________________________________________________________  External Attachment:    Type:   Image     Comment:   External Document

## 2010-04-22 ENCOUNTER — Encounter: Payer: Self-pay | Admitting: Family Medicine

## 2010-04-23 ENCOUNTER — Encounter: Payer: Self-pay | Admitting: Family Medicine

## 2010-05-05 NOTE — Letter (Signed)
Summary: DMV Handicap Placard Application  DMV Handicap Placard Application   Imported By: Kassie Mends 04/29/2010 09:39:11  _____________________________________________________________________  External Attachment:    Type:   Image     Comment:   External Document

## 2010-05-14 NOTE — Consult Note (Signed)
Summary: [facility]  [facility]   Imported By: Sherian Rein 05/04/2010 10:41:26  _____________________________________________________________________  External Attachment:    Type:   Image     Comment:   External Document

## 2010-05-21 ENCOUNTER — Ambulatory Visit (INDEPENDENT_AMBULATORY_CARE_PROVIDER_SITE_OTHER): Payer: Medicare Other | Admitting: Family Medicine

## 2010-05-21 ENCOUNTER — Encounter: Payer: Self-pay | Admitting: Family Medicine

## 2010-05-21 DIAGNOSIS — R21 Rash and other nonspecific skin eruption: Secondary | ICD-10-CM

## 2010-05-21 DIAGNOSIS — L299 Pruritus, unspecified: Secondary | ICD-10-CM

## 2010-05-26 NOTE — Assessment & Plan Note (Signed)
Summary: F/U RASH/CLE  MEDICARE   Vital Signs:  Patient profile:   77 year old female Weight:      138.25 pounds Temp:     98.1 degrees F oral Pulse rate:   74 / minute Pulse rhythm:   regular BP sitting:   124 / 80  (left arm) Cuff size:   regular  Vitals Entered By: [name] CMA Duncan Dull) (May 21, 2010 2:58 PM) CC: Rash    History of Present Illness: CC: f/u rash.  itch/rash started again 1 wk ago again.  took whole box of benadryl prior.  when taking pills, stopped itching.  Stopped pill and itch/rash restarted.  Also using OTC steroid cream.  that helped some, stops itching but then returns after shower.  Hot shower makes this itch better.  Itching all over, when scratches skin turns red.  Has had nettle rash in past (after something she ate).  No new lotions, detergents, soaps.  Rash both arms AC fossa, under neck and chin, one spot on back.  Using Oil of Olay soap for years.  Eyes are runny, itchy.  thinks due to season change.  Not sneezing.  No RN, congestion.  No problem with seasonal allergies before.  Simvastatin 80mg  every other day for long time.  on baby asa as well  Current Medications (verified): 1)  Zocor 80 Mg Tabs (Simvastatin) .... Take One By Mouth Daily 2)  Aspirin 81 Mg Tbec (Aspirin) .... Take One By Mouth Daily 3)  Optivar 0.05 %  Soln (Azelastine Hcl) .... One Drop Each Eye Twice A Day 4)  Vitamin D 2000 Unit Tabs (Cholecalciferol) .... Take 1 Tablet By Mouth Daily 5)  Ibuprofen 200 Mg Tabs (Ibuprofen) .... As Directed As Needed  Allergies: 1)  Penicillin 2)  Sulfa 3)  Quinine 4)  Fosamax  Past History:  Past Medical History: Last updated: 04/10/2007 GERD Osteoarthritis- hands, knees Osteoporosis Rheumatoid arthritis- ? fx wrist R 08 diverticulitis 09 gallstones (incidental) 09  Social History: Last updated: 09/21/2007 Marital Status: married  Children:  Occupation:  non smoker no alcohol  Review of Systems       per  HPI  Physical Exam  General:  well appearing elderly female Skin:  pruritic macular erythematous rash on back from iliac crest to braline.   pruritic papular rash forehead, cheeks, bilateral anterior neck down to chest.  some dry skin/erythematous and pruritic rash on AC fossa bilaterally.  No scaling.     Impression & Recommendations:  Problem # 1:  SKIN RASH (ICD-782.1) no hives today.  pruritic rash, spread throughout body.  likely allergic contact dermatitis.   trial of TCI cream (pt prefers to ointment) and benadryl for itch to see if again can break cycle (had success with this last visit). hesitant to do oral steroids in this 77yo. doubt xerosis as improved after hot shower.  thyroid normal 10/2009.  no changes of lichen simplex chornicus Discussed medication use and symptom control.  if not improving consider referral back to Dr. Margo Aye (hasn't seen in 3-4 years)  Her updated medication list for this problem includes:    Triamcinolone Acetonide 0.5 % Crea (Triamcinolone acetonide) .Marland Kitchen... Apply to aa two times a day x 2-3 weeks, large op  Problem # 2:  PRURITUS (ICD-698.9) see above. last visit checked blood work, WNL. excoriations present today.  Complete Medication List: 1)  Zocor 80 Mg Tabs (Simvastatin) .... Take one by mouth daily 2)  Aspirin 81 Mg Tbec (Aspirin) .Marland KitchenMarland KitchenMarland Kitchen  Take one by mouth daily 3)  Optivar 0.05 % Soln (Azelastine hcl) .... One drop each eye twice a day 4)  Vitamin D 2000 Unit Tabs (Cholecalciferol) .... Take 1 tablet by mouth daily 5)  Ibuprofen 200 Mg Tabs (Ibuprofen) .... As directed as needed 6)  Triamcinolone Acetonide 0.5 % Crea (Triamcinolone acetonide) .... Apply to aa two times a day x 2-3 weeks, large op  Patient Instructions: 1)  Trial of steroid cream for rash. 2)  I think this is likely irritation from something in the environment. 3)  May do another course of benadryl.   4)  If not better, we may send you back to Dr.  Margo Aye. Prescriptions: TRIAMCINOLONE ACETONIDE 0.5 % CREA (TRIAMCINOLONE ACETONIDE) apply to aa two times a day x 2-3 weeks, large OP  #1 x 1   Entered and Authorized by:   Eustaquio Boyden  MD   Signed by:   Eustaquio Boyden  MD on 05/21/2010   Method used:   Print then Give to Patient   RxID:   [id number] TRIAMCINOLONE ACETONIDE 0.5 % CREA (TRIAMCINOLONE ACETONIDE) apply to aa two times a day x 2-3 weeks, large OP  #1 x 1   Entered and Authorized by:   Eustaquio Boyden  MD   Signed by:   Eustaquio Boyden  MD on 05/21/2010   Method used:   Electronically to        [facility] [id number] (retail)       [address]       [place], [place]  [address]       Ph: [phone]       Fax: [phone]   RxID:   [id number] TRIAMCINOLONE ACETONIDE 0.5 % OINT (TRIAMCINOLONE ACETONIDE) apply on affected area two times a day x 2-3 wks, large OP  #1 x 0   Entered and Authorized by:   Eustaquio Boyden  MD   Signed by:   Eustaquio Boyden  MD on 05/21/2010   Method used:   Electronically to        [facility]  [address] [phone][facility] (retail)       [address]       [place], [place]  [address]       Ph: [phone]       Fax: [phone]   RxID:   [id number]    Orders Added: 1)  Est. Patient Level III [[address]]    Current Allergies (reviewed today): PENICILLIN SULFA QUININE FOSAMAX  Appended Document: F/U RASH/CLE  MEDICARE     Clinical Lists Changes  Orders: Added new Service order of Prescription Created Electronically [phone]) - Signed

## 2010-07-21 ENCOUNTER — Encounter: Payer: Self-pay | Admitting: Family Medicine

## 2010-07-22 ENCOUNTER — Encounter: Payer: Self-pay | Admitting: Family Medicine

## 2010-07-22 ENCOUNTER — Ambulatory Visit (INDEPENDENT_AMBULATORY_CARE_PROVIDER_SITE_OTHER): Payer: Medicare Other | Admitting: Family Medicine

## 2010-07-22 ENCOUNTER — Telehealth: Payer: Self-pay | Admitting: Radiology

## 2010-07-22 DIAGNOSIS — R42 Dizziness and giddiness: Secondary | ICD-10-CM

## 2010-07-22 DIAGNOSIS — I10 Essential (primary) hypertension: Secondary | ICD-10-CM

## 2010-07-22 DIAGNOSIS — E7800 Pure hypercholesterolemia, unspecified: Secondary | ICD-10-CM

## 2010-07-22 DIAGNOSIS — R252 Cramp and spasm: Secondary | ICD-10-CM | POA: Insufficient documentation

## 2010-07-22 LAB — COMPREHENSIVE METABOLIC PANEL
AST: 15 U/L (ref 0–37)
Albumin: 3.9 g/dL (ref 3.5–5.2)
BUN: 17 mg/dL (ref 6–23)
Calcium: 9.9 mg/dL (ref 8.4–10.5)
Chloride: 103 mEq/L (ref 96–112)
Creatinine, Ser: 1.1 mg/dL (ref 0.4–1.2)
GFR: 52.12 mL/min — ABNORMAL LOW (ref >60.00)
Glucose, Bld: 75 mg/dL (ref 70–99)

## 2010-07-22 LAB — CBC WITH DIFFERENTIAL/PLATELET
Basophils Relative: 0.3 % (ref 0.0–3.0)
Eosinophils Relative: 1.2 % (ref 0.0–5.0)
Hemoglobin: 13.4 g/dL (ref 12.0–15.0)
Lymphocytes Relative: 23.6 % (ref 12.0–46.0)
MCHC: 34.6 g/dL (ref 30.0–36.0)
Monocytes Relative: 7.9 % (ref 3.0–12.0)
Neutro Abs: 5.5 10*3/uL (ref 1.4–7.7)
Neutrophils Relative %: 67 % (ref 43.0–77.0)
RBC: 4.19 Mil/uL (ref 3.87–5.11)
WBC: 8.2 10*3/uL (ref 4.5–10.5)

## 2010-07-22 LAB — LIPID PANEL
HDL: 39.9 mg/dL (ref >39.00)
Total CHOL/HDL Ratio: 5
Triglycerides: 291 mg/dL — ABNORMAL HIGH (ref 0.0–149.0)
VLDL: 58.2 mg/dL — ABNORMAL HIGH (ref 0.0–40.0)

## 2010-07-22 LAB — LDL CHOLESTEROL, DIRECT: Direct LDL: 122 mg/dL

## 2010-07-22 NOTE — Progress Notes (Signed)
Subjective:    Patient ID: Chloe Cooper, female    DOB: 28-Jan-1917, 77 y.o.   MRN: [id number]  HPI Here for dizziness  Has a habit of moving slowly Gets woozy sensation when she is looking at something - no warning  Off balance and light headed-- after she has changed position  No nausea or v No weakness/ numbness or trouble speaking Is normal to get dizzy when stand - does that slowly and can handle that  Some allergy symptoms - occ sneezes /not too congestion Vision is ok    Legs getting more cramps as night-- each one lasts about 2-3 seconds But legs hurt when she moves them  Last 2 weeks   On chol meds --- 10 years -- ? Doubts this could be the reason Also has some arthritis  Tries to stay as active as she can   Past Medical History  Diagnosis Date  . GERD (gastroesophageal reflux disease)   . Osteoporosis   . 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 Wrist fracture, right 2008  . Diverticulitis 2009  . Gallstones 2009    History   Social History  . Marital Status: Married    Spouse Name: N/A    Number of Children: N/A  . Years of Education: N/A   Occupational History  . Not on file.   Social History Main Topics  . Smoking status: Never Smoker   . Smokeless tobacco: Not on file  . Alcohol Use: No  . Drug Use:   . Sexually Active:    Other Topics Concern  . Not on file   Social History Narrative  . No narrative on file        Review of Systems Review of Systems  Constitutional: Negative for fever, appetite change, fatigue and unexpected weight change.  Eyes: Negative for pain and visual disturbance.  Respiratory: Negative for cough and shortness of breath.   Cardiovascular: Negative for cp or sob or edema .   Gastrointestinal: Negative for nausea, diarrhea and constipation.  Genitourinary: Negative for urgency and frequency.  Skin: Negative for pallor. rash is better  MSK pos for leg pain, neg for swollen joints  Neurological:  Negative for weakness, , numbness, headaches, tremor, speech problems or vision changes  Hematological: Negative for adenopathy. Does not bruise/bleed easily.  Psychiatric/Behavioral: Negative for dysphoric mood. The patient is not nervous/anxious.          Objective:   Physical Exam  Constitutional: She appears well-developed and well-nourished. No distress.  HENT:  Head: Normocephalic and atraumatic.  Eyes: Conjunctivae and EOM are normal. Pupils are equal, round, and reactive to light.  Neck: Normal range of motion. Neck supple. Carotid bruit is not present. No thyromegaly present.  Cardiovascular: Normal rate, regular rhythm and normal heart sounds.   Pulmonary/Chest: Effort normal and breath sounds normal. No respiratory distress. She has no wheezes.  Abdominal: Soft. Bowel sounds are normal. She exhibits no abdominal bruit and no mass. There is no tenderness.  Musculoskeletal: Normal range of motion. She exhibits no edema and no tenderness.       No warmth/ reddness or palp cords in legs   Neurological: She is alert. She has normal strength and normal reflexes. No cranial nerve deficit or sensory deficit. She displays a negative Romberg sign. Coordination and gait normal.       No nystagmus  Fundi grossly nl   Skin: Skin is warm and dry.  Psychiatric: She has  a normal mood and affect. Her behavior is normal.          Assessment & Plan:

## 2010-07-22 NOTE — Assessment & Plan Note (Signed)
This seems to be positional  Nl neuro exam Suspect mild vertigo or orthostasis  Disc s/s of cva- red flags to watch for  Labs today and update  inst to call if symptoms worsen or do not improve

## 2010-07-22 NOTE — Assessment & Plan Note (Signed)
Cramps and some generalized pain and restless component  Stop statin temporarily in case that is the cause  May be multifactorial - has OA as well  Check labs for lytes and cbc

## 2010-07-22 NOTE — Patient Instructions (Signed)
Labs today Stop the simvastatin/ zocor in case it is causting leg pain  Call and leave me a message in 2 weeks with how your legs are  Also - move slowly- change positions with care in light of dizziness  If dizziness worsens please let me know

## 2010-07-22 NOTE — Telephone Encounter (Signed)
Please call lab results, unable to use the phone tree. Thanks, Camelia Eng

## 2010-07-23 ENCOUNTER — Telehealth: Payer: Self-pay

## 2010-07-23 NOTE — Assessment & Plan Note (Signed)
Labs today on simvastatin and diet  Will hold simvastain for a while and update to see if that is causing leg pain  Diet is good

## 2010-07-23 NOTE — Assessment & Plan Note (Signed)
In good control with lifestyle  No changes  No obvious orthostasis  Labs today

## 2010-07-23 NOTE — Telephone Encounter (Signed)
Patient notified as instructed by telephone. 

## 2010-07-23 NOTE — Telephone Encounter (Signed)
Message copied by Lewanda Rife on Thu Jul 23, 2010  9:51 AM ------      Message from: Roxy Manns      Created: Thu Jul 23, 2010  9:28 AM       Labs are overall ok       Chol up a bit so watch diet (aware she is going to hold the simvastatin as well)       No cause for leg cramps      Update me re: dizziness and leg pain if it does not improve      Can also try 4oz of tonic water each eve to see if this helps with cramps

## 2010-07-24 NOTE — Assessment & Plan Note (Signed)
[facility]                         GASTROENTEROLOGY OFFICE NOTE   Chloe Cooper, Chloe Cooper                    MRN:          [id number]  DATE:06/07/2006                            DOB:          06-30-1916    REFERRING PHYSICIAN:  Marne A. Tower, MD   REASON FOR CONSULTATION:  Rectal pain.   ASSESSMENT:  A 77 year old white women who has never had a colonoscopy  (question barium enema in the past) who recently saw Dr. Milinda Antis with  lower abdominal pain and rectal pain and constipation problems.  This  latest for about 4 or 5 days.  She eventually improved.  There is also a  history of chronic intermittent rectal bleeding.  The constipation was a  rare and sort of a new thing and a change in bowel habits, though it was  transient.   RECOMMENDATIONS AND PLAN:  It is reasonable to investigate with a  colonoscopy because of these change in bowel habits and rectal bleeding.  It may just be a self limited constipation problem, but given her age  and the change in bowels and the lack of any recent investigations it is  appropriate.  She is elderly, but she is very fit for her age.  I  explained the risks, benefits and indications of colonoscopy.  It is my  intention to perform the examination, but if it is technically  significantly difficult, I would pull back and not try to force the  issue.  She understands.   HISTORY:  A 77 year old white women who presented to Dr. Milinda Antis on March  12.  I have reviewed those records.  History really is outlined above.  She seldom gets constipated.  She also was having some urinary problems  at the time.  Dysuria like symptoms, I do not think she was treated for  a urinary tract infection as the UA was negative at the time.  When she  does have a hard bowel movement she will see some bright red blood per  rectum with them, but it is always bright red blood and on the toilet  paper or I guess streaked on the stool.  She  does complain of some  chronic gas and bloating symptoms.  She had some sort of a colonic  investigation more than 10 years ago, it could have been a  sigmoidoscopy, it could have been a barium enema based upon what she is  telling me.   PAST MEDICAL HISTORY:  Dyslipidemia, osteoarthritis, previous hemorrhoid  surgery 40 years ago, hysterectomy, benign breast surgery, appendectomy.   FAMILY HISTORY:  Sister had breast cancer, no family history of colon  cancer.   SOCIAL HISTORY:  She is married and her husband is 58, they both still  drive and perform activities of daily living.  She is retired from the  [employer].  She has a ninth grade education.  No alcohol,  tobacco or drugs.   REVIEW OF SYSTEMS:  Some pedal edema and joint pain, muscle pain, back  pain, all other systems negative.  See medical history form.   PHYSICAL EXAMINATION:  Reveals  a well-developed elderly white woman,  height 5 feet 2 inches, weight 142 pounds, blood pressure 142/68, pulse  80.  EYES:  Anicteric.  ENT:  Mouth, posterior pharynx revealed no lesions.  NECK:  Supple without thyromegaly.  CHEST:  Clear.  HEART:  S1, S2, no murmurs or gallops.  ABDOMEN:  Soft, nontender without organomegaly or mass.  RECTAL:  Deferred.  LYMPHATIC:  No neck or supraclavicular nodes palpated.  LOWER EXTREMITIES:  Free of edema at this time.  SKIN:  Warm and dry without any acute rash.  NEURO/PSYCH:  She is alert and oriented x3.   I have reviewed the office notes sent from Dr. Milinda Antis.   I appreciate the opportunity to care for this patient.     Iva Boop, MD,FACG  Electronically Signed    CEG/MedQ  DD: 06/07/2006  DT: 06/07/2006  Job #: [id number]   cc:   Marne A. Milinda Antis, MD

## 2010-08-05 ENCOUNTER — Telehealth: Payer: Self-pay | Admitting: *Deleted

## 2010-08-05 NOTE — Telephone Encounter (Signed)
Patient called to let you know that her cramps are doing much better, but she is still having pain in legs and knees. She says that the dizziness has improved, she is only getting dizzy with fast movement or when she first stands up.

## 2010-08-05 NOTE — Telephone Encounter (Signed)
Good -- stay off the chol med for now  Update me if dizziness worsens F/u with me in about 6 wk unless otherwise scheduled

## 2010-08-06 NOTE — Telephone Encounter (Signed)
Patient notified as instructed by telephone. Pt scheduled follow up appt with Dr Milinda Antis 09/21/10 at 2pm.

## 2010-08-21 ENCOUNTER — Encounter: Payer: Self-pay | Admitting: Family Medicine

## 2010-08-21 ENCOUNTER — Ambulatory Visit (INDEPENDENT_AMBULATORY_CARE_PROVIDER_SITE_OTHER): Payer: Medicare Other | Admitting: Family Medicine

## 2010-08-21 DIAGNOSIS — R42 Dizziness and giddiness: Secondary | ICD-10-CM

## 2010-08-21 DIAGNOSIS — R252 Cramp and spasm: Secondary | ICD-10-CM

## 2010-08-21 DIAGNOSIS — M1990 Unspecified osteoarthritis, unspecified site: Secondary | ICD-10-CM | POA: Insufficient documentation

## 2010-08-21 DIAGNOSIS — E7800 Pure hypercholesterolemia, unspecified: Secondary | ICD-10-CM

## 2010-08-21 MED ORDER — SIMVASTATIN 80 MG PO TABS: 40.0000 mg | ORAL_TABLET | Freq: Every evening | ORAL | Status: DC

## 2010-08-21 NOTE — Assessment & Plan Note (Signed)
In spine and likely hips/ knees Getting around fairly Disc use of ibuprofen very carefully with food - max 200 mg bid  Will continue to follow Fall prec disc

## 2010-08-21 NOTE — Assessment & Plan Note (Signed)
Since stopping statin did not help pain- will go back to simvastatin (however at 42 given current guidelines) Disc low sat fat diet  F/u next mo as planned

## 2010-08-21 NOTE — Progress Notes (Signed)
Subjective:    Patient ID: Chloe Cooper, female    DOB: 24-Nov-1916, 77 y.o.   MRN: [id number]  HPI Here to f/u for leg cramping/ pain and dizziness  Dizziness is better -- thankful for that - thinks it was vertigo  Tonic water does good for her leg cramps too- is sleeping better   Now having more pain in general from buttocks to ankles  Both legs pain behind knees- radiates down to the toes Ibuprofen 1 pill bid - and helps a lot  Knows it is arthritis   Also a feeling of numbness in both legs- that is chronic   Cannot walk as well - uses cane for support -- feels generally weaker on R -uses cane on that side   Seen Dr Cleophas Dunker years ago  He told her he did not recommend knee replacement at her age   She likes to move around and stay active -- but has to rest at times  Low back hurts too   Does not think her chol med caused pain at all - no difference off of it  She takes her vit D  She quit vit D for a while - and pain did not help  Patient Active Problem List  Diagnoses  . UNSPECIFIED VITAMIN D DEFICIENCY  . HYPERCHOLESTEROLEMIA  . ESSENTIAL HYPERTENSION, BENIGN  . GERD  . DIVERTICULOSIS OF COLON  . CHOLELITHIASIS  . UTI'S, RECURRENT  . OSTEOARTHRITIS  . BAKER'S CYST  . OSTEOPOROSIS  . ACTINIC KERATOSIS, HX OF  . Dizziness  . Leg cramps  . Osteoarthritis   Past Medical History  Diagnosis Date  . GERD (gastroesophageal reflux disease)   . Osteoporosis   . 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 Wrist fracture, right 2008  . Diverticulitis 2009  . Gallstones 2009   Past Surgical History  Procedure Date  . Appendectomy    History  Substance Use Topics  . Smoking status: Never Smoker   . Smokeless tobacco: Not on file  . Alcohol Use: No   Family History  Problem Relation Age of Onset  . Heart disease Father     CAD  . Heart disease Brother     CAD  . Heart disease Brother     CAD  . Diverticulitis Sister    Allergies  Allergen  Reactions  . Alendronate Sodium     REACTION: GI  . Penicillins     REACTION: rash  . Quinine     REACTION: nausea  . Sulfonamide Derivatives     REACTION: rash   Current Outpatient Prescriptions on File Prior to Visit  Medication Sig Dispense Refill  . aspirin 81 MG tablet Take 81 mg by mouth daily.        Marland Kitchen azelastine (OPTIVAR) 0.05 % ophthalmic solution Place 1 drop into both eyes daily.       Marland Kitchen ibuprofen (ADVIL,MOTRIN) 200 MG tablet OTC as directed.       . Cholecalciferol (VITAMIN D) 2000 UNITS CAPS Take 1 capsule by mouth daily.            Review of Systems  Review of Systems  Constitutional: Negative for fever, appetite change, fatigue and unexpected weight change.  Eyes: Negative for pain and visual disturbance.  Respiratory: Negative for cough and shortness of breath.   Cardiovascular: Negative.for cp or sob   Gastrointestinal: Negative for nausea, diarrhea and constipation.  Genitourinary: Negative for urgency and frequency.  Skin: Negative for  pallor.  MSK pos for joint pain/ arthritis Neurological: Negative for weakness, light-headedness, and headaches.pos for numbness  Hematological: Negative for adenopathy. Does not bruise/bleed easily.  Psychiatric/Behavioral: Negative for dysphoric mood. The patient is not nervous/anxious.         Objective:   Physical Exam  Constitutional: She appears well-developed and well-nourished. No distress.       Somewhat frail appearing elderly female  HENT:  Head: Normocephalic and atraumatic.  Right Ear: External ear normal.  Left Ear: External ear normal.  Eyes: Conjunctivae and EOM are normal. Pupils are equal, round, and reactive to light.  Neck: Normal range of motion. Neck supple. No JVD present. No thyromegaly present.  Cardiovascular: Normal rate, regular rhythm, normal heart sounds and intact distal pulses.   Pulmonary/Chest: Effort normal and breath sounds normal.  Musculoskeletal: She exhibits tenderness. She  exhibits no edema.       Generally poor rom knees/ hips  Signs of oa in fingers  Lymphadenopathy:    She has no cervical adenopathy.  Neurological: She is alert. She has normal reflexes. No cranial nerve deficit. Coordination normal.  Skin: Skin is warm and dry. No rash noted. No erythema. No pallor.  Psychiatric: She has a normal mood and affect.          Assessment & Plan:

## 2010-08-21 NOTE — Patient Instructions (Signed)
Get back on cholesterol medicine 1/2 of an 80 mg simvastatin per day If you need ibuprofen- take it with food Go back on your vitamin D Continue the tonic water for cramps

## 2010-08-21 NOTE — Assessment & Plan Note (Signed)
Better with tonic water nightly Nl lytes Rev lab with pt Will update if sympt return

## 2010-08-21 NOTE — Assessment & Plan Note (Addendum)
Better now- suspect it was vertigo Is quite steady Update if symptoms return

## 2010-08-27 ENCOUNTER — Encounter: Payer: Self-pay | Admitting: Family Medicine

## 2010-08-27 ENCOUNTER — Ambulatory Visit (INDEPENDENT_AMBULATORY_CARE_PROVIDER_SITE_OTHER): Payer: Medicare Other | Admitting: Family Medicine

## 2010-08-27 DIAGNOSIS — R350 Frequency of micturition: Secondary | ICD-10-CM

## 2010-08-27 DIAGNOSIS — R252 Cramp and spasm: Secondary | ICD-10-CM

## 2010-08-27 DIAGNOSIS — N390 Urinary tract infection, site not specified: Secondary | ICD-10-CM

## 2010-08-27 LAB — POCT URINALYSIS DIPSTICK
Bilirubin, UA: NEGATIVE
Blood, UA: NEGATIVE
Nitrite, UA: NEGATIVE
Protein, UA: NEGATIVE
pH, UA: 6.5

## 2010-08-27 MED ORDER — CIPROFLOXACIN HCL 250 MG PO TABS: 250.0000 mg | ORAL_TABLET | Freq: Two times a day (BID) | ORAL | Status: AC

## 2010-08-27 NOTE — Progress Notes (Signed)
  Subjective:    Patient ID: Chloe Cooper, female    DOB: February 10, 1917, 77 y.o.   MRN: [id number]  HPI CC: uti?  H/o recurrnet UTIs, previously saw Dr. Aldean Ast who treated him with nitrofurantoin 50mg  bid for several months.  Had done well without UTI since 2011.    Then 2d ago started having abd pain lower abd.  Urine looked cloudy.  No dysuria, + urgency and frequency.  Waking up at night to go to void x 3.  Denies fevers/chills, nausea/vomiting.  Denies flank pain  Tonic water helps leg cramps but legs still hurt occasionally.  Review of Systems Per HPI    Objective:   Physical Exam  Nursing note and vitals reviewed. Constitutional: She appears well-developed and well-nourished. No distress.  Cardiovascular: Normal rate, regular rhythm, normal heart sounds and intact distal pulses.   No murmur heard. Pulmonary/Chest: Effort normal and breath sounds normal. No respiratory distress. She has no wheezes. She has no rales.  Abdominal: Soft. Bowel sounds are normal. She exhibits no distension. There is no tenderness. There is no rebound, no guarding and no CVA tenderness.       + suprapubic pressure  Skin: Skin is warm and dry. No rash noted.          Assessment & Plan:

## 2010-08-27 NOTE — Assessment & Plan Note (Signed)
Consistent with UTI.  In setting of h/o recurrent, sent culture. Treat with Cipro 250mg  bid x 5 days. Update [place] if not improving as expected. Lab Results  Component Value Date   CREATININE 1.1 07/22/2010

## 2010-08-27 NOTE — Patient Instructions (Addendum)
Looks like repeat UTI. Treat with cipro twice daily for 5 days. Update [place] if not better after treatment. Push fluids (water and cranberry juice) and plenty of rest. For legs, try tonic water nightly and tylenol (acetaminophen) as needed.

## 2010-08-27 NOTE — Assessment & Plan Note (Signed)
Notices improvement with tonic water but not using nightly. rec may try nightly, consider tylenol.

## 2010-08-30 LAB — URINE CULTURE: Colony Count: >=100000

## 2010-09-01 ENCOUNTER — Emergency Department (HOSPITAL_COMMUNITY)
Admission: EM | Admit: 2010-09-01 | Discharge: 2010-09-01 | Disposition: A | Discharge status: Home / Self Care | Payer: Medicare Other | Attending: Emergency Medicine | Admitting: Emergency Medicine

## 2010-09-01 ENCOUNTER — Emergency Department (HOSPITAL_COMMUNITY): Payer: Medicare Other

## 2010-09-01 DIAGNOSIS — M79609 Pain in unspecified limb: Secondary | ICD-10-CM | POA: Insufficient documentation

## 2010-09-01 DIAGNOSIS — M25559 Pain in unspecified hip: Secondary | ICD-10-CM | POA: Insufficient documentation

## 2010-09-01 DIAGNOSIS — M545 Low back pain, unspecified: Secondary | ICD-10-CM | POA: Insufficient documentation

## 2010-09-01 DIAGNOSIS — E789 Disorder of lipoprotein metabolism, unspecified: Secondary | ICD-10-CM | POA: Insufficient documentation

## 2010-09-01 DIAGNOSIS — M25579 Pain in unspecified ankle and joints of unspecified foot: Secondary | ICD-10-CM | POA: Insufficient documentation

## 2010-09-01 DIAGNOSIS — K5900 Constipation, unspecified: Secondary | ICD-10-CM | POA: Insufficient documentation

## 2010-09-01 DIAGNOSIS — M19049 Primary osteoarthritis, unspecified hand: Secondary | ICD-10-CM | POA: Insufficient documentation

## 2010-09-01 DIAGNOSIS — Z79899 Other long term (current) drug therapy: Secondary | ICD-10-CM | POA: Insufficient documentation

## 2010-09-01 DIAGNOSIS — M5430 Sciatica, unspecified side: Secondary | ICD-10-CM | POA: Insufficient documentation

## 2010-09-01 DIAGNOSIS — IMO0001 Reserved for inherently not codable concepts without codable children: Secondary | ICD-10-CM | POA: Insufficient documentation

## 2010-09-01 DIAGNOSIS — R209 Unspecified disturbances of skin sensation: Secondary | ICD-10-CM | POA: Insufficient documentation

## 2010-09-01 DIAGNOSIS — M069 Rheumatoid arthritis, unspecified: Secondary | ICD-10-CM | POA: Insufficient documentation

## 2010-09-14 ENCOUNTER — Emergency Department (HOSPITAL_COMMUNITY)
Admission: EM | Admit: 2010-09-14 | Discharge: 2010-09-14 | Disposition: A | Discharge status: Home / Self Care | Payer: Medicare Other | Attending: Emergency Medicine | Admitting: Emergency Medicine

## 2010-09-14 ENCOUNTER — Emergency Department (HOSPITAL_COMMUNITY): Payer: Medicare Other

## 2010-09-14 DIAGNOSIS — M549 Dorsalgia, unspecified: Secondary | ICD-10-CM | POA: Insufficient documentation

## 2010-09-14 DIAGNOSIS — Z79899 Other long term (current) drug therapy: Secondary | ICD-10-CM | POA: Insufficient documentation

## 2010-09-14 DIAGNOSIS — R109 Unspecified abdominal pain: Secondary | ICD-10-CM | POA: Insufficient documentation

## 2010-09-14 LAB — URINALYSIS, ROUTINE W REFLEX MICROSCOPIC
Glucose, UA: NEGATIVE mg/dL
Hgb urine dipstick: NEGATIVE
Ketones, ur: NEGATIVE mg/dL
Protein, ur: NEGATIVE mg/dL

## 2010-09-14 LAB — COMPREHENSIVE METABOLIC PANEL
AST: 18 U/L (ref 0–37)
Albumin: 3.9 g/dL (ref 3.5–5.2)
Alkaline Phosphatase: 52 U/L (ref 39–117)
BUN: 15 mg/dL (ref 6–23)
CO2: 27 mEq/L (ref 19–32)
Chloride: 104 mEq/L (ref 96–112)
GFR calc non Af Amer: >60 mL/min (ref >60)
Potassium: 4.8 mEq/L (ref 3.5–5.1)
Total Bilirubin: 0.4 mg/dL (ref 0.3–1.2)

## 2010-09-14 LAB — CBC
HCT: 36.8 % (ref 36.0–46.0)
RBC: 4.1 MIL/uL (ref 3.87–5.11)
RDW: 12.5 % (ref 11.5–15.5)
WBC: 8.9 10*3/uL (ref 4.0–10.5)

## 2010-09-14 LAB — DIFFERENTIAL
Basophils Absolute: 0 10*3/uL (ref 0.0–0.1)
Eosinophils Relative: 1 % (ref 0–5)
Lymphocytes Relative: 22 % (ref 12–46)
Lymphs Abs: 2 10*3/uL (ref 0.7–4.0)
Neutro Abs: 6.1 10*3/uL (ref 1.7–7.7)
Neutrophils Relative %: 69 % (ref 43–77)

## 2010-09-14 LAB — URINE MICROSCOPIC-ADD ON

## 2010-09-21 ENCOUNTER — Ambulatory Visit: Payer: Medicare Other | Admitting: Family Medicine

## 2010-09-25 ENCOUNTER — Emergency Department (HOSPITAL_COMMUNITY): Payer: Medicare Other

## 2010-09-25 ENCOUNTER — Emergency Department (HOSPITAL_COMMUNITY)
Admission: EM | Admit: 2010-09-25 | Discharge: 2010-09-25 | Disposition: A | Discharge status: Home / Self Care | Payer: Medicare Other | Attending: Emergency Medicine | Admitting: Emergency Medicine

## 2010-09-25 DIAGNOSIS — G8929 Other chronic pain: Secondary | ICD-10-CM | POA: Insufficient documentation

## 2010-09-25 DIAGNOSIS — M069 Rheumatoid arthritis, unspecified: Secondary | ICD-10-CM | POA: Insufficient documentation

## 2010-09-25 DIAGNOSIS — Z79899 Other long term (current) drug therapy: Secondary | ICD-10-CM | POA: Insufficient documentation

## 2010-09-25 DIAGNOSIS — R109 Unspecified abdominal pain: Secondary | ICD-10-CM | POA: Insufficient documentation

## 2010-09-25 DIAGNOSIS — Z9889 Other specified postprocedural states: Secondary | ICD-10-CM | POA: Insufficient documentation

## 2010-09-25 DIAGNOSIS — M546 Pain in thoracic spine: Secondary | ICD-10-CM | POA: Insufficient documentation

## 2010-09-25 DIAGNOSIS — E789 Disorder of lipoprotein metabolism, unspecified: Secondary | ICD-10-CM | POA: Insufficient documentation

## 2010-09-25 LAB — URINALYSIS, ROUTINE W REFLEX MICROSCOPIC
Ketones, ur: NEGATIVE mg/dL
Nitrite: NEGATIVE
Specific Gravity, Urine: 1.013 (ref 1.005–1.030)
pH: 8 (ref 5.0–8.0)

## 2010-09-25 LAB — CBC
HCT: 38.6 % (ref 36.0–46.0)
Hemoglobin: 13.5 g/dL (ref 12.0–15.0)
MCH: 31.6 pg (ref 26.0–34.0)
MCHC: 35 g/dL (ref 30.0–36.0)
MCV: 90.4 fL (ref 78.0–100.0)
Platelets: 265 K/uL (ref 150–400)
RBC: 4.27 MIL/uL (ref 3.87–5.11)
RDW: 12.6 % (ref 11.5–15.5)
WBC: 8.7 K/uL (ref 4.0–10.5)

## 2010-09-25 LAB — COMPREHENSIVE METABOLIC PANEL WITH GFR
ALT: 7 U/L (ref 0–35)
AST: 12 U/L (ref 0–37)
Alkaline Phosphatase: 54 U/L (ref 39–117)
CO2: 27 meq/L (ref 19–32)
Chloride: 100 meq/L (ref 96–112)
GFR calc Af Amer: >60 mL/min (ref >60)
GFR calc non Af Amer: >60 mL/min (ref >60)
Glucose, Bld: 100 mg/dL — ABNORMAL HIGH (ref 70–99)
Sodium: 136 meq/L (ref 135–145)
Total Bilirubin: 0.5 mg/dL (ref 0.3–1.2)

## 2010-09-25 LAB — URINE MICROSCOPIC-ADD ON

## 2010-09-25 LAB — COMPREHENSIVE METABOLIC PANEL
Albumin: 3.9 g/dL (ref 3.5–5.2)
BUN: 17 mg/dL (ref 6–23)
Calcium: 9.8 mg/dL (ref 8.4–10.5)
Creatinine, Ser: 0.71 mg/dL (ref 0.50–1.10)
Potassium: 4.2 mEq/L (ref 3.5–5.1)
Total Protein: 7 g/dL (ref 6.0–8.3)

## 2010-09-25 LAB — DIFFERENTIAL
Basophils Absolute: 0 10*3/uL (ref 0.0–0.1)
Basophils Relative: 0 % (ref 0–1)
Eosinophils Absolute: 0.1 K/uL (ref 0.0–0.7)
Eosinophils Relative: 1 % (ref 0–5)
Lymphocytes Relative: 24 % (ref 12–46)
Lymphs Abs: 2.1 K/uL (ref 0.7–4.0)
Monocytes Absolute: 1 10*3/uL (ref 0.1–1.0)
Monocytes Relative: 11 % (ref 3–12)
Neutro Abs: 5.6 K/uL (ref 1.7–7.7)
Neutrophils Relative %: 64 % (ref 43–77)

## 2010-09-25 LAB — LIPASE, BLOOD: Lipase: 11 U/L (ref 11–59)

## 2010-09-30 ENCOUNTER — Ambulatory Visit (INDEPENDENT_AMBULATORY_CARE_PROVIDER_SITE_OTHER): Payer: Medicare Other | Admitting: Family Medicine

## 2010-09-30 ENCOUNTER — Encounter: Payer: Self-pay | Admitting: Family Medicine

## 2010-09-30 ENCOUNTER — Telehealth: Payer: Self-pay | Admitting: *Deleted

## 2010-09-30 VITALS — BP 128/72 | HR 76 | Temp 98.1°F | Ht 65.0 in | Wt 129.0 lb

## 2010-09-30 DIAGNOSIS — K8020 Calculus of gallbladder without cholecystitis without obstruction: Secondary | ICD-10-CM

## 2010-09-30 DIAGNOSIS — K5900 Constipation, unspecified: Secondary | ICD-10-CM | POA: Insufficient documentation

## 2010-09-30 DIAGNOSIS — M1990 Unspecified osteoarthritis, unspecified site: Secondary | ICD-10-CM

## 2010-09-30 NOTE — Telephone Encounter (Signed)
PA at [facility] called to report that pt showed up in their office today complaining of constipation x 5 days and nausea.  She has been on dilaudid and percocet.  She says she has been seen at ER twice for this but they keep sending her away.  ER told her to follow up with Dr. Cleophas Dunker.  She is doing well from ortho stand point, back pain is better and she is walking without support.  Please advise.

## 2010-09-30 NOTE — Telephone Encounter (Signed)
Pt had small hard BM on Mon 09/28/10. No BM since then and pt has taken stool softener twice a day every day this week. Last week pt felt the beginnings of constipation; pt said constipation got worse after taking pain pills. and took MOM x2, and a fleets enema on 09/21/10. Pt said when she was in ER she was not having a lot of trouble with constipation so she did not mention to ER dr. Dr Milinda Antis had a cancellation today and pt made appt to see Dr Milinda Antis today at 3:30pm. ER records are on your shelf in the in box if you have time to review before pt comes. Thank you.

## 2010-09-30 NOTE — Assessment & Plan Note (Signed)
After years - pt is starting to become symptomatic with R sided abd pain and also R mid back and shoulder pain Did surgical ref today for further eval  Despite age she still may be a good candidate for ccy Rev ER records and scans and labs with pt today

## 2010-09-30 NOTE — Assessment & Plan Note (Signed)
After use of narcotic pain med (percocet )  Adv to continue stool softener Add zantac for any reflux symptoms also  Inc water intake  Add miralax 1 dose twice daily until bm and then once daily  Minimize narcotic use best she can If not imp in several days- will call or if worse or any vomiting No signs of obst on exam

## 2010-09-30 NOTE — Progress Notes (Signed)
Subjective:    Patient ID: Chloe Cooper, female    DOB: [birth year], 77 y.o.   MRN: [id number]  HPI Here for constipation  Has been on narcotic pain med for severe back pain (comp fx) On percocet  Last visit here was having leg pain and numbness  Went to Dr Cleophas Dunker Told she has OA Had shots in both hips earlier in the month in both hips -- numbness is gone and less pain in hips  Then woke up one night with back pain - starting in R shoulder blade and going across whole back Had to go to hospital more than once  Had CT and MRI and ultrasound and chest x ray   Given percocet for pain   Went to Dr Hoy Register office - sent to hosp for chills (was just cold)  Told to come here today They did not look at her back   Did have gallstones at ER also comp fx at [facility]   Also comp fx in spine   No BM in 5 days   Is burning under breasts and in abd  Feels like indigesiton  Feels like she needs to have bm Still hurting in R shoulder blade   Stool softener no help  MOM One enema - did not help Had some nausea - no vomiting - not now  Patient Active Problem List  Diagnoses  . UNSPECIFIED VITAMIN D DEFICIENCY  . HYPERCHOLESTEROLEMIA  . ESSENTIAL HYPERTENSION, BENIGN  . GERD  . DIVERTICULOSIS OF COLON  . CHOLELITHIASIS  . UTI'S, RECURRENT  . OSTEOARTHRITIS  . BAKER'S CYST  . OSTEOPOROSIS  . ACTINIC KERATOSIS, HX OF  . Dizziness  . Leg cramps  . Osteoarthritis  . UTI (lower urinary tract infection)  . Gallstones  . Constipation   Past Medical History  Diagnosis Date  . GERD (gastroesophageal reflux disease)   . Osteoporosis   . 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 Wrist fracture, right 2008  . Diverticulitis 2009  . Gallstones 2009   Past Surgical History  Procedure Date  . Appendectomy    History  Substance Use Topics  . Smoking status: Never Smoker   . Smokeless tobacco: Not on file  . Alcohol Use: No   Family History  Problem Relation  Age of Onset  . Heart disease Father     CAD  . Heart disease Brother     CAD  . Heart disease Brother     CAD  . Diverticulitis Sister    Allergies  Allergen Reactions  . Alendronate Sodium     REACTION: GI  . Penicillins     REACTION: rash  . Quinine     REACTION: nausea  . Sulfonamide Derivatives     REACTION: rash   Current Outpatient Prescriptions on File Prior to Visit  Medication Sig Dispense Refill  . aspirin 81 MG tablet Take 81 mg by mouth daily.        Marland Kitchen azelastine (OPTIVAR) 0.05 % ophthalmic solution Place 1 drop into both eyes daily.       . Cholecalciferol (VITAMIN D) 2000 UNITS CAPS Take 1 capsule by mouth daily.        . simvastatin (ZOCOR) 80 MG tablet Take 0.5 tablets (40 mg total) by mouth every evening.  30 tablet  0  . ibuprofen (ADVIL,MOTRIN) 200 MG tablet OTC as directed.               Review of Systems  Review of Systems  Constitutional: Negative for fever, appetite change, fatigue and unexpected weight change.  Eyes: Negative for pain and visual disturbance.  Respiratory: Negative for cough and shortness of breath.   Cardiovascular: Negative.  for cp or sob or palpitations Gastrointestinal: pos for R abd and flank pain and also constipation with bloating , neg for vomiting or blood in stool Genitourinary: Negative for urgency and frequency.  Skin: Negative for pallor. or rash Neurological: Negative for weakness, light-headedness, numbness and headaches.  Hematological: Negative for adenopathy. Does not bruise/bleed easily.  Psychiatric/Behavioral: Negative for dysphoric mood. The patient is not nervous/anxious.          Objective:   Physical Exam  Constitutional: She appears well-developed and well-nourished.  HENT:  Head: Normocephalic and atraumatic.  Mouth/Throat: Oropharynx is clear and moist.  Eyes: Conjunctivae and EOM are normal. Pupils are equal, round, and reactive to light.  Neck: Normal range of motion. Neck supple. No JVD  present. Carotid bruit is not present. No thyromegaly present.  Cardiovascular: Normal rate and regular rhythm.   Abdominal: Soft. Bowel sounds are normal. She exhibits no distension and no mass. There is tenderness. There is no rebound and no guarding.       RUQ tenderness without rebound or murphy sign  Protuberant abd  Some mild tenderness LLQ without mass  Nl bs 4 Q (not high pitched)   Musculoskeletal: She exhibits no edema and no tenderness.  Lymphadenopathy:    She has no cervical adenopathy.  Neurological: She is alert. She has normal reflexes. No cranial nerve deficit.  Skin: Skin is warm and dry. No rash noted. No erythema. No pallor.  Psychiatric: She has a normal mood and affect.          Assessment & Plan:

## 2010-09-30 NOTE — Telephone Encounter (Signed)
Thanks- will see her today

## 2010-09-30 NOTE — Assessment & Plan Note (Signed)
Doing fairly well after hip injections with Dr Cleophas Dunker  Suspect back pain may be more from gallstones- and she is trying to minimize the amt of percocet she takes

## 2010-09-30 NOTE — Telephone Encounter (Signed)
Please call her and talk to her about her symptoms  Get ER notes if we do not have them  Ask what she is taking for constipation and how much fluid she is getting  If we have a cancellation - or no show put her on the schedule please- let them know up front

## 2010-09-30 NOTE — Patient Instructions (Signed)
Get over the counter zantac for acid reflux/ heartburn symptoms  And take 75 mg twice daily Take the docusate sodium stool softener as directed Drink lots of water  Get miralax over the counter- and take 1 serving twice daily until you start having bowel movements and then decrease to once per day  If that does not start to work- call and let me know  If you get severe abdominal pain go to ER We will refer you to surgeon for gallstones at check out  Please keep me updated

## 2010-10-02 ENCOUNTER — Telehealth: Payer: Self-pay | Admitting: Family Medicine

## 2010-10-05 DIAGNOSIS — M069 Rheumatoid arthritis, unspecified: Secondary | ICD-10-CM | POA: Insufficient documentation

## 2010-10-05 DIAGNOSIS — K8050 Calculus of bile duct without cholangitis or cholecystitis without obstruction: Secondary | ICD-10-CM | POA: Insufficient documentation

## 2010-10-05 DIAGNOSIS — Z79899 Other long term (current) drug therapy: Secondary | ICD-10-CM | POA: Insufficient documentation

## 2010-10-05 DIAGNOSIS — Z7982 Long term (current) use of aspirin: Secondary | ICD-10-CM | POA: Insufficient documentation

## 2010-10-05 DIAGNOSIS — R109 Unspecified abdominal pain: Secondary | ICD-10-CM | POA: Insufficient documentation

## 2010-10-05 DIAGNOSIS — E789 Disorder of lipoprotein metabolism, unspecified: Secondary | ICD-10-CM | POA: Insufficient documentation

## 2010-10-05 DIAGNOSIS — K5900 Constipation, unspecified: Secondary | ICD-10-CM | POA: Insufficient documentation

## 2010-10-06 ENCOUNTER — Encounter (INDEPENDENT_AMBULATORY_CARE_PROVIDER_SITE_OTHER): Payer: Self-pay | Admitting: Surgery

## 2010-10-06 ENCOUNTER — Telehealth: Payer: Self-pay | Admitting: *Deleted

## 2010-10-06 ENCOUNTER — Emergency Department (HOSPITAL_COMMUNITY)
Admission: EM | Admit: 2010-10-06 | Discharge: 2010-10-06 | Disposition: A | Discharge status: Home / Self Care | Payer: Medicare Other | Attending: Emergency Medicine | Admitting: Emergency Medicine

## 2010-10-06 ENCOUNTER — Emergency Department (HOSPITAL_COMMUNITY): Payer: Medicare Other

## 2010-10-06 LAB — COMPREHENSIVE METABOLIC PANEL
ALT: 9 U/L (ref 0–35)
CO2: 29 mEq/L (ref 19–32)
Calcium: 10.4 mg/dL (ref 8.4–10.5)
Creatinine, Ser: 0.71 mg/dL (ref 0.50–1.10)
GFR calc Af Amer: >60 mL/min (ref >60)
GFR calc non Af Amer: >60 mL/min (ref >60)
Glucose, Bld: 111 mg/dL — ABNORMAL HIGH (ref 70–99)
Sodium: 137 mEq/L (ref 135–145)
Total Protein: 6.8 g/dL (ref 6.0–8.3)

## 2010-10-06 LAB — DIFFERENTIAL
Basophils Relative: 0 % (ref 0–1)
Eosinophils Absolute: 0 10*3/uL (ref 0.0–0.7)
Lymphs Abs: 1.7 10*3/uL (ref 0.7–4.0)
Monocytes Absolute: 0.8 10*3/uL (ref 0.1–1.0)
Monocytes Relative: 8 % (ref 3–12)

## 2010-10-06 LAB — URINALYSIS, ROUTINE W REFLEX MICROSCOPIC
Nitrite: NEGATIVE
Specific Gravity, Urine: 1.014 (ref 1.005–1.030)
Urobilinogen, UA: 2 mg/dL — ABNORMAL HIGH (ref 0.0–1.0)
pH: 7.5 (ref 5.0–8.0)

## 2010-10-06 LAB — CBC
Hemoglobin: 12.9 g/dL (ref 12.0–15.0)
MCH: 31.2 pg (ref 26.0–34.0)
MCHC: 34.4 g/dL (ref 30.0–36.0)
MCV: 90.6 fL (ref 78.0–100.0)
Platelets: 251 10*3/uL (ref 150–400)

## 2010-10-06 LAB — URINE MICROSCOPIC-ADD ON

## 2010-10-06 LAB — LIPASE, BLOOD: Lipase: 14 U/L (ref 11–59)

## 2010-10-06 NOTE — Telephone Encounter (Signed)
Patient has an appt to see Dr. Magnus Ivan on

## 2010-10-07 NOTE — Telephone Encounter (Signed)
Thanks for the update- that is good

## 2010-10-07 NOTE — Telephone Encounter (Signed)
(  Suregeon) on Monday. She wanted to let you know.

## 2010-10-08 ENCOUNTER — Encounter (INDEPENDENT_AMBULATORY_CARE_PROVIDER_SITE_OTHER): Payer: Self-pay | Admitting: Surgery

## 2010-10-08 ENCOUNTER — Ambulatory Visit (INDEPENDENT_AMBULATORY_CARE_PROVIDER_SITE_OTHER): Payer: Medicare Other | Admitting: Surgery

## 2010-10-08 ENCOUNTER — Ambulatory Visit (INDEPENDENT_AMBULATORY_CARE_PROVIDER_SITE_OTHER): Payer: Self-pay | Admitting: Surgery

## 2010-10-08 VITALS — BP 120/70 | HR 84 | Temp 98.9°F | Ht 65.0 in | Wt 127.0 lb

## 2010-10-08 DIAGNOSIS — M549 Dorsalgia, unspecified: Secondary | ICD-10-CM

## 2010-10-08 DIAGNOSIS — K8010 Calculus of gallbladder with chronic cholecystitis without obstruction: Secondary | ICD-10-CM

## 2010-10-08 MED ORDER — OXYCODONE HCL 5 MG PO TABS: 5.0000 mg | ORAL_TABLET | Freq: Four times a day (QID) | ORAL | Status: DC | PRN

## 2010-10-08 NOTE — Progress Notes (Signed)
Subjective:     Patient ID: Chloe Cooper, female   DOB: 02/25/1917, 77 y.o.   MRN: [id number]  HPI  Patient is a 77 year old who's had pain in her right upper quadrant and back for the past month. She notes gradually it came on. At one point she says this been there all the time. Another point at she says it comes and goes. For the most part she says it comes anteriorly and radiates to her back. However couple time she says it comes back and moved to the front. She says it's hard to tell.  She normally can eat anything she wants. She eats bacon and eggs every morning. However she's had decreased appetite and more nausea this past week.  Zofran has helped that.  She's been using oxycodone to mass the pain that she got from her pain specialist. She wants more. She denies much pain with breathing or sneezing returning. However it is uncomfortable to sit for prolonged periods of time. No falls no traumas. She's had shingles in the past and this does not feel like that. She did not have much pain or discomfort. She denies any rash or drainage.  She denies heartburn. She denies reflux. She burps & belches but that's been going on for years. She has mild constipation. She did take a laxative and had good that cleanout of bowel movements 2 days ago  She and is very tired and is hard to walk a block. She comes today in a wheelchair, however she, she can get up to the exam table slowly.  She denies any sick contacts. No travel history. No prior bowel problems. She had an appendectomy in the distant past and an umbilical hernia repair. No other surgeries.  She has history of a low back pain. She's been followed by a pain specialist for hip pain ass well. That improved in June.  She's had these pains often wake her up at night. She has gone to the emergency room numerous times. She had a negative ultrasound aside from incidental gallstone. She had a negative CAT scan. Her labs been normal. She tells me she had  a urinary tract infection last month, however that was treated and she's fine. No symptoms. She came and saw her primary care physician. Dr. Milinda Antis was concerned that the patient was given the fact that she has gallstones. Review of Systems  Constitutional: Positive for chills and appetite change. Negative for fever, diaphoresis, activity change and fatigue.  HENT: Negative for ear pain, sore throat, trouble swallowing, neck pain and ear discharge.   Eyes: Negative for photophobia, discharge and visual disturbance.  Respiratory: Negative for cough, choking, chest tightness, shortness of breath and stridor.        DYSPNEA WALKING <1 BLOCK  Cardiovascular: Negative for chest pain, palpitations and leg swelling.  Gastrointestinal: Positive for abdominal pain. Negative for nausea, vomiting, diarrhea, constipation, blood in stool, abdominal distention, anal bleeding and rectal pain.  Genitourinary: Negative for dysuria, urgency, frequency, vaginal bleeding, vaginal discharge and difficulty urinating.  Musculoskeletal: Positive for back pain, arthralgias and gait problem. Negative for myalgias.  Skin: Negative for color change, pallor and rash.  Neurological: Negative for dizziness, speech difficulty, weakness and numbness.  Hematological: Negative for adenopathy.  Psychiatric/Behavioral: Negative for confusion and agitation. The patient is not nervous/anxious.        Objective:   Physical Exam  Constitutional: She is oriented to person, place, and time. She appears well-developed and well-nourished. No distress.  HENT:  Head: Normocephalic.  Mouth/Throat: Oropharynx is clear and moist. No oropharyngeal exudate.  Eyes: Conjunctivae and EOM are normal. Pupils are equal, round, and reactive to light. No scleral icterus.  Neck: Normal range of motion. Neck supple. No tracheal deviation present.  Cardiovascular: Normal rate, regular rhythm and intact distal pulses.   Pulmonary/Chest: Effort normal  and breath sounds normal. No respiratory distress. She has no wheezes. She has no rales. She exhibits no tenderness.       Mild B subcostal/costal TTP.  No stepoff  Abdominal: Soft. She exhibits no distension and no mass. There is no tenderness. Hernia confirmed negative in the right inguinal area and confirmed negative in the left inguinal area.       Mild R flank > epigastric TTP.  No Murphy's sign  Genitourinary: No vaginal discharge found.  Musculoskeletal: Normal range of motion. She exhibits no tenderness.  Lymphadenopathy:    She has no cervical adenopathy.       Right: No inguinal adenopathy present.       Left: No inguinal adenopathy present.  Neurological: She is alert and oriented to person, place, and time. No cranial nerve deficit. She exhibits normal muscle tone. Coordination normal.  Skin: Skin is warm and dry. No rash noted. She is not diaphoretic. No erythema.  Psychiatric: She has a normal mood and affect. Her behavior is normal. Judgment and thought content normal.   Studies: As above. Ultrasound showing 2 incidental calcified gallstones. No bubble oral thickening. No pericholecystic fluid. No Murphy sign.  CT scan does show moderate volume of stool. No hiatal hernia. No pancreatitis. No cholecystitis. No kidney stones. No bowel obstruction. No obvious hernia.  X-rays show 18 9 changes. However it is chronic it is not changed over the past 3 years. No new areas.    Assessment:     Intermittent now constant right upper quadrant and back pain of uncertain etiology. Story suspicious for biliary colic given the fact it's worse at night and now with nausea and decreased appetite. Differential diagnosis less likely.     Plan:     Is not a straightforward situation. After talking to her for about 45 minutes, and getting a better sense that this perhaps is biliary in nature. I opted to cholecystectomies her. I think from a technical standpoint she would not be too  difficult.  However with her poor exercise tolerance, I think she needs cardiac clearance. She is not in severe distress now. The soreness is very mild. I think she's more frustrated than anything else.  I recommended a bland diet. I gave her handouts on this. Cystic away from the and heavy meals and she tended to eat. She is leaning more towards surgery. She understands there are risks. She said "I rather timetable from a heart attack and then stay like this"  Work to drain get it done quickly. I gave her our card. I educated she should call if things get worse. I had discussion in front of her son and her husband. All agree with this plan.

## 2010-10-08 NOTE — Patient Instructions (Signed)
Managing Pain  Pain after surgery or related to activity is often due to strain/injury to muscle, tendon, nerves and/or incisions.  This pain is usually short-term and will improve in a few months.   Many people find it helpful to do the following things TOGETHER to help speed the process of healing and to get back to regular activity more quickly:  1. Avoid heavy physical activity a.  no lifting greater than 20 pounds b. Do not "push through" the pain.  Listen to your body and avoid positions and maneuvers than reproduce the pain c. Walking is okay as tolerated, but go slowly and stop when getting sore.  d. Remember: If it hurts to do it, then don't do it! 2. Take Anti-inflammatory medication  a. Take with food/snack around the clock for 1-2 weeks i. This helps the muscle and nerve tissues become less irritable and calm down faster b. Choose ONE of the following over-the-counter medications: i. Naproxen 220mg  tabs (ex. Aleve) 1-2 pills twice a day  ii. Ibuprofen 200mg  tabs (ex. Advil, Motrin) 3-4 pills with every meal and just before bedtime iii. Acetaminophen 500mg  tabs (Tylenol) 1-2 pills with every meal and just before bedtime 3. Use a Heating pad or Ice/Cold Pack a. 4-6 times a day b. May use warm bath/hottub  or showers 4. Try Gentle Massage and/or Stretching  a. at the area of pain many times a day b. stop if you feel pain - do not overdo it  Try these steps together to help you body heal faster and avoid making things get worse.  Doing just one of these things may not be enough.    If you are not getting better after two weeks or are noticing you are getting worse, contact our office for further advice; we may need to re-evaluate you & see what other things we can do to help.    CONSIDER SURGERY.  BLAND DIET  FIBER TO PREVENT CONSTIPATION:   GETTING TO GOOD BOWEL HEALTH. Irregular bowel habits such as constipation and diarrhea can lead to many problems over time.  Having  one soft bowel movement a day is the most important way to prevent further problems.  The anorectal canal is designed to handle stretching and feces to safely manage our ability to get rid of solid waste (feces, poop, stool) out of our body.  BUT, hard constipated stools can act like ripping concrete bricks and diarrhea can be a burning fire to this very sensitive area of our body, causing inflamed hemorrhoids, anal fissures, increasing risk is perirectal abscesses, abdominal pain/bloating, an making irritable bowel worse.     The goal: ONE SOFT BOWEL MOVEMENT A DAY!  To have soft, regular bowel movements:    Drink at least 8 tall glasses of water a day.     Take plenty of fiber.  Fiber is the undigested part of plant food that passes into the colon, acting s "natures broom" to encourage bowel motility and movement.  Fiber can absorb and hold large amounts of water. This results in a larger, bulkier stool, which is soft and easier to pass. Work gradually over several weeks up to 6 servings a day of fiber (25g a day even more if needed) in the form of: o Vegetables -- Root (potatoes, carrots, turnips), leafy green (lettuce, salad greens, celery, spinach), or cooked high residue (cabbage, broccoli, etc) o Fruit -- Fresh (unpeeled skin & pulp), Dried (prunes, apricots, cherries, etc ),  or stewed ( applesauce)  o Whole grain breads, pasta, etc (whole wheat)  o Bran cereals    Bulking Agents -- This type of water-retaining fiber generally is easily obtained each day by one of the following:  o Psyllium bran -- The psyllium plant is remarkable because its ground seeds can retain so much water. This product is available as Metamucil, Konsyl, Effersyllium, Per Diem Fiber, or the less expensive generic preparation in drug and health food stores. Although labeled a laxative, it really is not a laxative.  o Methylcellulose -- This is another fiber derived from wood which also retains water. It is available as  Citrucel. o Polyethylene Glycol - and "artificial" fiber commonly called Miralax or Glycolax.  It is helpful for people with gassy or bloated feelings with regular fiber o Flax Seed - a less gassy fiber than psyllium   No reading or other relaxing activity while on the toilet. If bowel movements take longer than 5 minutes, you are too constipated   AVOID CONSTIPATION.  High fiber and water intake usually takes care of this.  Sometimes a laxative is needed to stimulate more frequent bowel movements, but    Laxatives are not a good long-term solution as it can wear the colon out. o Osmotics (Milk of Magnesia, Fleets phosphosoda, Magnesium citrate, MiraLax, GoLytely) are safer than  o Stimulants (Senokot, Castor Oil, Dulcolax, Ex Lax)    o Do not take laxatives for more than 7days in a row.    IF SEVERELY CONSTIPATED, try a Bowel Retraining Program: o Do not use laxatives.  o Eat a diet high in roughage, such as bran cereals and leafy vegetables.  o Drink six (6) ounces of prune or apricot juice each morning.  o Eat two (2) large servings of stewed fruit each day.  o Take one (1) heaping tablespoon of a psyllium-based bulking agent twice a day. Use sugar-free sweetener when possible to avoid excessive calories.  o Eat a normal breakfast.  o Set aside 15 minutes after breakfast to sit on the toilet, but do not strain to have a bowel movement.  o If you do not have a bowel movement by the third day, use an enema and repeat the above steps.    Controlling diarrhea o Switch to liquids and simpler foods for a few days to avoid stressing your intestines further. o Avoid dairy products (especially milk & ice cream) for a short time.  The intestines often can lose the ability to digest lactose when stressed. o Avoid foods that cause gassiness or bloating.  Typical foods include beans and other legumes, cabbage, broccoli, and dairy foods.  Every person has some sensitivity to other foods, so listen to our  body and avoid those foods that trigger problems for you. o Adding fiber (Citrucel, Metamucil, psyllium, Miralax) gradually can help thicken stools by absorbing excess fluid and retrain the intestines to act more normally.  Slowly increase the dose over a few weeks.  Too much fiber too soon can backfire and cause cramping & bloating. o Probiotics (such as active yogurt, Align, etc) may help repopulate the intestines and colon with normal bacteria and calm down a sensitive digestive tract.  Most studies show it to be of mild help, though, and such products can be costly. o Medicines:   Bismuth subsalicylate (ex. Kayopectate, Pepto Bismol) every 30 minutes for up to 6 doses can help control diarrhea.  Avoid if pregnant.   Loperamide (Immodium) can slow down diarrhea.  Start with two tablets (  4mg  total) first and then try one tablet every 6 hours.  Avoid if you are having fevers or severe pain.  If you are not better or start feeling worse, stop all medicines and call your doctor for advice Call your doctor if you are getting worse or not better.  Sometimes further testing (cultures, endoscopy, X-ray studies, bloodwork, etc) may be needed to help diagnose and treat the cause of the diarrhea. GETTING TO GOOD BOWEL HEALTH. Irregular bowel habits such as constipation and diarrhea can lead to many problems over time.  Having one soft bowel movement a day is the most important way to prevent further problems.  The anorectal canal is designed to handle stretching and feces to safely manage our ability to get rid of solid waste (feces, poop, stool) out of our body.  BUT, hard constipated stools can act like ripping concrete bricks and diarrhea can be a burning fire to this very sensitive area of our body, causing inflamed hemorrhoids, anal fissures, increasing risk is perirectal abscesses, abdominal pain/bloating, an making irritable bowel worse.     The goal: ONE SOFT BOWEL MOVEMENT A DAY!  To have soft, regular  bowel movements:  Drink at least 8 tall glasses of water a day.   Take plenty of fiber.  Fiber is the undigested part of plant food that passes into the colon, acting s "natures broom" to encourage bowel motility and movement.  Fiber can absorb and hold large amounts of water. This results in a larger, bulkier stool, which is soft and easier to pass. Work gradually over several weeks up to 6 servings a day of fiber (25g a day even more if needed) in the form of: Vegetables -- Root (potatoes, carrots, turnips), leafy green (lettuce, salad greens, celery, spinach), or cooked high residue (cabbage, broccoli, etc) Fruit -- Fresh (unpeeled skin & pulp), Dried (prunes, apricots, cherries, etc ),  or stewed ( applesauce)  Whole grain breads, pasta, etc (whole wheat)  Bran cereals  Bulking Agents -- This type of water-retaining fiber generally is easily obtained each day by one of the following:  Psyllium bran -- The psyllium plant is remarkable because its ground seeds can retain so much water. This product is available as Metamucil, Konsyl, Effersyllium, Per Diem Fiber, or the less expensive generic preparation in drug and health food stores. Although labeled a laxative, it really is not a laxative.  Methylcellulose -- This is another fiber derived from wood which also retains water. It is available as Citrucel. Polyethylene Glycol - and "artificial" fiber commonly called Miralax or Glycolax.  It is helpful for people with gassy or bloated feelings with regular fiber Flax Seed - a less gassy fiber than psyllium No reading or other relaxing activity while on the toilet. If bowel movements take longer than 5 minutes, you are too constipated AVOID CONSTIPATION.  High fiber and water intake usually takes care of this.  Sometimes a laxative is needed to stimulate more frequent bowel movements, but  Laxatives are not a good long-term solution as it can wear the colon out. Osmotics (Milk of Magnesia, Fleets  phosphosoda, Magnesium citrate, MiraLax, GoLytely) are safer than  Stimulants (Senokot, Castor Oil, Dulcolax, Ex Lax)    Do not take laxatives for more than 7days in a row.  IF SEVERELY CONSTIPATED, try a Bowel Retraining Program: Do not use laxatives.  Eat a diet high in roughage, such as bran cereals and leafy vegetables.  Drink six (6) ounces of prune or apricot  juice each morning.  Eat two (2) large servings of stewed fruit each day.  Take one (1) heaping tablespoon of a psyllium-based bulking agent twice a day. Use sugar-free sweetener when possible to avoid excessive calories.  Eat a normal breakfast.  Set aside 15 minutes after breakfast to sit on the toilet, but do not strain to have a bowel movement.  If you do not have a bowel movement by the third day, use an enema and repeat the above steps.  Controlling diarrhea Switch to liquids and simpler foods for a few days to avoid stressing your intestines further. Avoid dairy products (especially milk & ice cream) for a short time.  The intestines often can lose the ability to digest lactose when stressed. Avoid foods that cause gassiness or bloating.  Typical foods include beans and other legumes, cabbage, broccoli, and dairy foods.  Every person has some sensitivity to other foods, so listen to our body and avoid those foods that trigger problems for you. Adding fiber (Citrucel, Metamucil, psyllium, Miralax) gradually can help thicken stools by absorbing excess fluid and retrain the intestines to act more normally.  Slowly increase the dose over a few weeks.  Too much fiber too soon can backfire and cause cramping & bloating. Probiotics (such as active yogurt, Align, etc) may help repopulate the intestines and colon with normal bacteria and calm down a sensitive digestive tract.  Most studies show it to be of mild help, though, and such products can be costly. Medicines: Bismuth subsalicylate (ex. Kayopectate, Pepto Bismol) every 30 minutes  for up to 6 doses can help control diarrhea.  Avoid if pregnant. Loperamide (Immodium) can slow down diarrhea.  Start with two tablets (4mg  total) first and then try one tablet every 6 hours.  Avoid if you are having fevers or severe pain.  If you are not better or start feeling worse, stop all medicines and call your doctor for advice o Call your doctor if you are getting worse or not better.  Sometimes further testing (cultures, endoscopy, X-ray studies, bloodwork, etc) may be needed to help diagnose and treat the cause of the diarrhea.

## 2010-10-12 ENCOUNTER — Telehealth (INDEPENDENT_AMBULATORY_CARE_PROVIDER_SITE_OTHER): Payer: Self-pay

## 2010-10-12 ENCOUNTER — Ambulatory Visit (INDEPENDENT_AMBULATORY_CARE_PROVIDER_SITE_OTHER): Payer: Self-pay | Admitting: Surgery

## 2010-10-12 NOTE — Telephone Encounter (Signed)
Dr Royden Purl office called and states pt is medically cleared for surgery and they will fax a note and her most recent EKG

## 2010-10-15 ENCOUNTER — Ambulatory Visit (INDEPENDENT_AMBULATORY_CARE_PROVIDER_SITE_OTHER): Payer: Medicare Other | Admitting: Cardiovascular Disease

## 2010-10-15 ENCOUNTER — Encounter: Payer: Self-pay | Admitting: Cardiovascular Disease

## 2010-10-15 VITALS — BP 126/66 | HR 83 | Ht 62.0 in | Wt 126.0 lb

## 2010-10-15 DIAGNOSIS — R0989 Other specified symptoms and signs involving the circulatory and respiratory systems: Secondary | ICD-10-CM

## 2010-10-15 DIAGNOSIS — R0609 Other forms of dyspnea: Secondary | ICD-10-CM

## 2010-10-15 DIAGNOSIS — R0602 Shortness of breath: Secondary | ICD-10-CM

## 2010-10-15 NOTE — Patient Instructions (Signed)
Your physician recommends that you schedule a follow-up appointment as needed with Dr Cooper.    

## 2010-10-16 ENCOUNTER — Telehealth (INDEPENDENT_AMBULATORY_CARE_PROVIDER_SITE_OTHER): Payer: Self-pay | Admitting: General Surgery

## 2010-10-16 NOTE — Telephone Encounter (Signed)
PT CALLED TO HAVE PAIN MEDICATION REFILLED SO SHE WOULD NOT RUN OUT OVER WEEKEND. SHE WAS TAKING OXYCODONE PER DR. GROSS. DR. Judie Petit. MARTIN ORDERED VICODIN 5/500MG  #20 TO BE CALLED TO [facility]- [address]/ [phone]. PT AWARE.

## 2010-10-17 ENCOUNTER — Encounter: Payer: Self-pay | Admitting: Cardiovascular Disease

## 2010-10-17 DIAGNOSIS — R0602 Shortness of breath: Secondary | ICD-10-CM | POA: Insufficient documentation

## 2010-10-17 NOTE — Progress Notes (Signed)
HPI:  This is a 77 year old woman presenting for preoperative cardiovascular evaluation. The patient has an upcoming laparoscopic cholecystectomy for treatment of chronic cholecystitis. The patient has been experiencing right upper quadrant pain radiating to her back. She has no history of cardiac disease, but she is limited by exertional dyspnea. She has felt this is age related. She is able to do light housework but describes tiredness, fatigue, and shortness of breath with running the vacuum cleaner. She denies chest pain or pressure. She does have back pain when she is up on her feet for too long. She denies lightheadedness, palpitations, or syncope.  Outpatient Encounter Prescriptions as of 10/15/2010  Medication Sig Dispense Refill  . azelastine (OPTIVAR) 0.05 % ophthalmic solution Place 1 drop into both eyes daily.       . Cholecalciferol (VITAMIN D) 2000 UNITS CAPS Take 1 capsule by mouth daily.        Marland Kitchen ibuprofen (ADVIL,MOTRIN) 200 MG tablet OTC as directed.       Marland Kitchen oxyCODONE (ROXICODONE) 5 MG immediate release tablet Take 1 tablet (5 mg total) by mouth every 6 (six) hours as needed for pain.  40 tablet  0  . oxyCODONE-acetaminophen (PERCOCET) 5-325 MG per tablet Take 2 tablets by mouth every 6 (six) hours as needed.        . ranitidine (ZANTAC) 75 MG tablet Take 75 mg by mouth 2 (two) times daily.        . simvastatin (ZOCOR) 80 MG tablet Take 0.5 tablets (40 mg total) by mouth every evening.  30 tablet  0  . aspirin 81 MG tablet Take 81 mg by mouth daily. HOLD for Surgery      . DISCONTD: ondansetron (ZOFRAN) 8 MG tablet Take 8 mg by mouth every 6 (six) hours.          Alendronate sodium; Penicillins; and Sulfonamide derivatives  Past Medical History  Diagnosis Date  . GERD (gastroesophageal reflux disease)   . Osteoporosis   . 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 Wrist fracture, right 2008  . Diverticulitis 2009  . Gallstones 2009  . Hyperlipidemia     Past  Surgical History  Procedure Date  . Appendectomy     History   Social History  . Marital Status: Married    Spouse Name: N/A    Number of Children: N/A  . Years of Education: N/A   Occupational History  . Not on file.   Social History Main Topics  . Smoking status: Never Smoker   . Smokeless tobacco: Not on file  . Alcohol Use: No  . Drug Use: No  . Sexually Active: Not on file   Other Topics Concern  . Not on file   Social History Narrative  . No narrative on file    Family History  Problem Relation Age of Onset  . Heart disease Father     CAD  . Heart disease Brother     CAD  . Heart disease Brother     CAD  . Diverticulitis Sister     ROS: General: no fevers/chills/night sweats Eyes: no blurry vision, diplopia, or amaurosis ENT: no sore throat or hearing loss Resp: no cough, wheezing, or hemoptysis CV: no edema or palpitations GI: no abdominal pain, nausea, vomiting, diarrhea, or constipation GU: no dysuria, frequency, or hematuria Skin: no rash Neuro: no headache, numbness, tingling, or weakness of extremities Musculoskeletal: no joint pain or swelling Heme: no bleeding, DVT, or easy  bruising Endo: no polydipsia or polyuria  BP 126/66  Pulse 83  Ht 5\' 2"  (1.575 m)  Wt 126 lb (57.153 kg)  BMI 23.05 kg/m2  PHYSICAL EXAM: Pt is alert and oriented, elderly woman, WD, WN, in no distress. HEENT: normal Neck: JVP normal. Carotid upstrokes normal without bruits. No thyromegaly. Lungs: equal expansion, clear bilaterally CV: Apex is discrete and nondisplaced, RRR without murmur or gallop Abd: soft, NT, +BS, no bruit, no hepatosplenomegaly Back: no CVA tenderness Ext: no C/C/E        Femoral pulses 2+= without bruits        DP/PT pulses intact and = Skin: warm and dry without rash Neuro: CNII-XII intact             Strength intact = bilaterally  EKG:  Normal sinus rhythm 83 beats per minute, within normal limits.  ASSESSMENT AND PLAN:

## 2010-10-17 NOTE — Assessment & Plan Note (Signed)
The patient's cardiac exam and EKG are normal. She can perform 4 METs of activity. Her lung exam is also within normal limits. I suspect her shortness of breath is age related. I don't think this patient needs preoperative cardiac testing and she can proceed with surgery, especially considering the low risk nature of laparoscopic cholecystectomy. She understands there is some increased risk with any general anesthesia at her advanced age, but again I do not believe this patient needs further risk stratification from a cardiac perspective.  Thank you for the opportunity to see this very nice patient. If she has any cardiac problems during her hospital stay I would be happy to see her in consultation.

## 2010-10-21 ENCOUNTER — Telehealth (INDEPENDENT_AMBULATORY_CARE_PROVIDER_SITE_OTHER): Payer: Self-pay

## 2010-10-21 ENCOUNTER — Telehealth: Payer: Self-pay | Admitting: Cardiovascular Disease

## 2010-10-21 NOTE — Telephone Encounter (Signed)
FAX# [phone] DR Viviann Spare GROSS NEEDS A WRITTEN SUR CLEARANCE FOR PT TO GER G- BLADDER SURGERY. PT STATES SHE GOT A VERBAL BUT THE DOCTOR OFFICE NEEDS IT IN WRITTEN.

## 2010-10-21 NOTE — Telephone Encounter (Signed)
Called pt to notify her that I received cardiac clearance from Dr Excell Seltzer. I made copy of clearance and attatched it to the surgical orders which I took over to the surgery schedulers. Hulda Humphrey

## 2010-10-21 NOTE — Telephone Encounter (Signed)
OFFICE NOTE  SENT TO DR GROSS VIA EPIC AS WELL  WILL MANUALLY FAX TO DR GROSS  NOTE OFFICE VISIT  CLEARS PT FOR SX./CY

## 2010-10-22 ENCOUNTER — Telehealth (INDEPENDENT_AMBULATORY_CARE_PROVIDER_SITE_OTHER): Payer: Self-pay

## 2010-10-22 ENCOUNTER — Inpatient Hospital Stay (HOSPITAL_COMMUNITY)
Admission: EM | Admit: 2010-10-22 | Discharge: 2010-10-25 | DRG: 419 | Disposition: A | Discharge status: Home / Self Care | Payer: Medicare Other | Source: Ambulatory Visit | Attending: Pulmonary Disease | Admitting: Pulmonary Disease

## 2010-10-22 DIAGNOSIS — K8020 Calculus of gallbladder without cholecystitis without obstruction: Secondary | ICD-10-CM

## 2010-10-22 DIAGNOSIS — M545 Low back pain, unspecified: Secondary | ICD-10-CM | POA: Diagnosis present

## 2010-10-22 DIAGNOSIS — Z7982 Long term (current) use of aspirin: Secondary | ICD-10-CM

## 2010-10-22 LAB — DIFFERENTIAL
Basophils Absolute: 0 10*3/uL (ref 0.0–0.1)
Eosinophils Relative: 1 % (ref 0–5)
Lymphocytes Relative: 23 % (ref 12–46)
Lymphs Abs: 1.6 10*3/uL (ref 0.7–4.0)
Monocytes Absolute: 0.6 10*3/uL (ref 0.1–1.0)
Monocytes Relative: 9 % (ref 3–12)
Neutro Abs: 4.7 10*3/uL (ref 1.7–7.7)

## 2010-10-22 LAB — SURGICAL PCR SCREEN: MRSA, PCR: NEGATIVE

## 2010-10-22 LAB — COMPREHENSIVE METABOLIC PANEL
Alkaline Phosphatase: 55 U/L (ref 39–117)
BUN: 15 mg/dL (ref 6–23)
Creatinine, Ser: 0.7 mg/dL (ref 0.50–1.10)
GFR calc Af Amer: >60 mL/min (ref >60)
Glucose, Bld: 96 mg/dL (ref 70–99)
Potassium: 3.8 mEq/L (ref 3.5–5.1)
Total Bilirubin: 0.4 mg/dL (ref 0.3–1.2)
Total Protein: 6.4 g/dL (ref 6.0–8.3)

## 2010-10-22 LAB — CBC
HCT: 36.5 % (ref 36.0–46.0)
Hemoglobin: 12.6 g/dL (ref 12.0–15.0)
MCHC: 34.5 g/dL (ref 30.0–36.0)
MCV: 90.3 fL (ref 78.0–100.0)

## 2010-10-22 LAB — LIPASE, BLOOD: Lipase: 8 U/L — ABNORMAL LOW (ref 11–59)

## 2010-10-22 NOTE — Telephone Encounter (Signed)
Called pt to check on her after our surgery scheduler came to me to let me know that the pt was not going to be able to make it till this Monday to have surgery by Dr Michaell Cowing. I called pt to see what was going on and the pt said she was in so much pain that she was going to call 911 to come take her to the hospital. I explained to the pt again that I just received her clearance to get surgery scheduled yesterday and that her orders were marked Urgent so the surgery got posted for Monday. The pt said she has been taking Vicodin but it was not helping and I asked her how many pills was she taking at a time.The pt has been taking 1 pill every 4hrs until this am she took 2 pills at 6:30am but still no relief. I told the pt she needed to make sure she was taking the medication every 4-6 hrs not to just take a couple of pills and be done. I did offer to refill her pain medicine to get her thru till her posted surgery but the pt just wants to go to the ER. The pt asked if a surgeon was going to be at the ER to do her surgery and I did advise her that she would have to evaluated by the ER doc to see if she is an emergency surgery case b/c she has been to the ER 4 times and sent home. I explained if her labs were not elevated that there is a chance she might be sent back home again. The pt did say the ER told her not to come back b/c last time they told her she was not an emergency case. I did tell the pt if she is in that much pain that she is describing that she needed to go to the ER just to see again b/c I could not give her an answer if she would be ok waiting till Monday. The pt said she was going to the ER. I advised DR Michaell Cowing of the situation./ [facility]

## 2010-10-23 ENCOUNTER — Other Ambulatory Visit (INDEPENDENT_AMBULATORY_CARE_PROVIDER_SITE_OTHER): Payer: Self-pay | Admitting: General Surgery

## 2010-10-23 DIAGNOSIS — K8010 Calculus of gallbladder with chronic cholecystitis without obstruction: Secondary | ICD-10-CM

## 2010-10-23 NOTE — Op Note (Signed)
[name], Chloe Cooper NO.:  [id number]  MEDICAL RECORD NO.:  [id number]  LOCATION:  5120                         FACILITY:  [facility]  PHYSICIAN:  Juanetta Gosling, MDDATE OF BIRTH:  05/10/1916  DATE OF PROCEDURE:  10/23/2010 DATE OF DISCHARGE:                              OPERATIVE REPORT   PREOPERATIVE DIAGNOSIS:  Biliary colic.  POSTOPERATIVE DIAGNOSIS:  Biliary colic.  PROCEDURE:  Laparoscopic cholecystectomy.  SURGEON:  Juanetta Gosling, MD  ASSISTANT:  None.  ANESTHESIA:  General.  ANESTHESIOLOGIST:  Germaine Pomfret, MD  SPECIMENS:  Gallbladder contents to Pathology.  ESTIMATED BLOOD LOSS:  Minimal.  COMPLICATIONS:  None.  DRAINS:  None.  DISPOSITION:  To recovery room in stable condition.  INDICATIONS:  This is a 77 year old female with a history of what appears to be biliary colic.  She was previously scheduled with one of my partners and then developed worsening of her pain.  She presented to the emergency room.  We admitted her and discussed a laparoscopic cholecystectomy with the risks and benefits associated with that procedure.  After informed consent was obtained from the patient, she was taken to the operating room.  She was administered ciprofloxacin due to her allergies.  She had sequential compression devices placed on her lower extremities prior to induction of anesthesia.  She was then placed under general anesthesia without complication.  Her abdomen was prepped and draped in standard sterile surgical fashion.  Surgical time-out was then performed.  PROCEDURE: I infiltrated 0.25% Marcaine below her umbilicus and made an incision in a vertical fashion.  I carried this down to her fascia.  The fascia was entered sharply.  Her peritoneum was entered bluntly.  A 0-Vicryl pursestring suture was placed through the fascia.  A Hasson trocar was then introduced and the abdomen was then insufflated to 15 mmHg pressure.   Three further 5-mm ports were placed in the epigastrium and right upper quadrant under direct vision after infiltration of local anesthetic without complication.  Her gallbladder was then retracted cephalad and lateral.  I dissected the triangle of Calot clearly obtaining the critical view of safety.  Her liver function tests and lipase were normal.  I did not do a cholangiogram.  I then clipped the duct three times and divided and treated the artery in a similar fashion.  There was another small branch of the artery that I clipped as well.  The gallbladder was then removed from the liver bed without any spillage.  It was placed in an EndoCatch bag and removed from the umbilical port.  Hemostasis was then obtained.  Irrigation was performed.  I removed the Hasson trocar and I tied this down obliterating the defect.  I viewed it from the epigastric port.  There was no evidence of an entry injury and this defect was obliterated.  I then desufflated the abdomen, removed the remainder of my ports.  I then closed the incisions with 4-0 Monocryl and Dermabond.  She tolerated this well, was extubated, and transferred to recovery room in stable condition.     Juanetta Gosling, MD     MCW/MEDQ  D:  10/23/2010  T:  10/23/2010  Job:  [id number]  cc:   Marne A. Milinda Antis, MD  Electronically Signed by Emelia Loron MD on 10/23/2010 02:58:51 PM

## 2010-10-25 LAB — COMPREHENSIVE METABOLIC PANEL
Albumin: 3.4 g/dL — ABNORMAL LOW (ref 3.5–5.2)
Alkaline Phosphatase: 55 U/L (ref 39–117)
BUN: 10 mg/dL (ref 6–23)
Chloride: 102 mEq/L (ref 96–112)
Creatinine, Ser: 0.66 mg/dL (ref 0.50–1.10)
GFR calc Af Amer: >60 mL/min (ref >60)
GFR calc non Af Amer: >60 mL/min (ref >60)
Glucose, Bld: 104 mg/dL — ABNORMAL HIGH (ref 70–99)
Potassium: 4.1 mEq/L (ref 3.5–5.1)
Total Bilirubin: 0.3 mg/dL (ref 0.3–1.2)

## 2010-10-25 LAB — CBC
HCT: 35.7 % — ABNORMAL LOW (ref 36.0–46.0)
Hemoglobin: 12 g/dL (ref 12.0–15.0)
MCV: 91.3 fL (ref 78.0–100.0)
RDW: 13.1 % (ref 11.5–15.5)
WBC: 8.4 10*3/uL (ref 4.0–10.5)

## 2010-10-26 ENCOUNTER — Ambulatory Visit (HOSPITAL_COMMUNITY): Admission: RE | Admit: 2010-10-26 | Payer: Medicare Other | Source: Ambulatory Visit | Admitting: Surgery

## 2010-10-28 ENCOUNTER — Emergency Department (HOSPITAL_COMMUNITY)
Admission: EM | Admit: 2010-10-28 | Discharge: 2010-10-28 | Disposition: A | Discharge status: Home / Self Care | Payer: Medicare Other | Attending: Emergency Medicine | Admitting: Emergency Medicine

## 2010-10-28 ENCOUNTER — Emergency Department (HOSPITAL_COMMUNITY): Payer: Medicare Other

## 2010-10-28 DIAGNOSIS — R1013 Epigastric pain: Secondary | ICD-10-CM

## 2010-10-28 DIAGNOSIS — K5900 Constipation, unspecified: Secondary | ICD-10-CM

## 2010-10-28 DIAGNOSIS — M549 Dorsalgia, unspecified: Secondary | ICD-10-CM

## 2010-10-28 DIAGNOSIS — R109 Unspecified abdominal pain: Secondary | ICD-10-CM | POA: Insufficient documentation

## 2010-10-28 LAB — COMPREHENSIVE METABOLIC PANEL
ALT: 16 U/L (ref 0–35)
AST: 22 U/L (ref 0–37)
Alkaline Phosphatase: 59 U/L (ref 39–117)
CO2: 29 mEq/L (ref 19–32)
Calcium: 10.7 mg/dL — ABNORMAL HIGH (ref 8.4–10.5)
Chloride: 98 mEq/L (ref 96–112)
GFR calc Af Amer: >60 mL/min (ref >60)
GFR calc non Af Amer: >60 mL/min (ref >60)
Glucose, Bld: 99 mg/dL (ref 70–99)
Potassium: 4.1 mEq/L (ref 3.5–5.1)
Sodium: 134 mEq/L — ABNORMAL LOW (ref 135–145)

## 2010-10-28 LAB — DIFFERENTIAL
Basophils Absolute: 0 K/uL (ref 0.0–0.1)
Basophils Relative: 0 % (ref 0–1)
Eosinophils Absolute: 0.1 K/uL (ref 0.0–0.7)
Eosinophils Relative: 1 % (ref 0–5)
Lymphocytes Relative: 11 % — ABNORMAL LOW (ref 12–46)
Lymphs Abs: 1.2 K/uL (ref 0.7–4.0)
Monocytes Absolute: 0.6 K/uL (ref 0.1–1.0)
Monocytes Relative: 5 % (ref 3–12)
Neutro Abs: 9.1 K/uL — ABNORMAL HIGH (ref 1.7–7.7)
Neutrophils Relative %: 83 % — ABNORMAL HIGH (ref 43–77)

## 2010-10-28 LAB — URINALYSIS, ROUTINE W REFLEX MICROSCOPIC
Bilirubin Urine: NEGATIVE
Glucose, UA: NEGATIVE mg/dL
Hgb urine dipstick: NEGATIVE
Ketones, ur: NEGATIVE mg/dL
Leukocytes, UA: NEGATIVE
Nitrite: NEGATIVE
Protein, ur: NEGATIVE mg/dL
Specific Gravity, Urine: 1.008 (ref 1.005–1.030)
Urobilinogen, UA: 0.2 mg/dL (ref 0.0–1.0)
pH: 8 (ref 5.0–8.0)

## 2010-10-28 LAB — CBC
Hemoglobin: 12.7 g/dL (ref 12.0–15.0)
MCH: 31.3 pg (ref 26.0–34.0)
Platelets: 257 10*3/uL (ref 150–400)
RBC: 4.06 MIL/uL (ref 3.87–5.11)
WBC: 10.9 10*3/uL — ABNORMAL HIGH (ref 4.0–10.5)

## 2010-10-28 MED ORDER — IOHEXOL 300 MG/ML  SOLN
80.0000 mL | Freq: Once | INTRAMUSCULAR | Status: AC | PRN
  Administered 2010-10-28: 75 mL via INTRAVENOUS

## 2010-10-30 ENCOUNTER — Telehealth: Payer: Self-pay | Admitting: Internal Medicine

## 2010-10-30 ENCOUNTER — Emergency Department (HOSPITAL_COMMUNITY)
Admission: EM | Admit: 2010-10-30 | Discharge: 2010-10-30 | Disposition: A | Discharge status: Home / Self Care | Payer: Medicare Other | Attending: Emergency Medicine | Admitting: Emergency Medicine

## 2010-10-30 ENCOUNTER — Emergency Department (HOSPITAL_COMMUNITY): Payer: Medicare Other

## 2010-10-30 DIAGNOSIS — K5900 Constipation, unspecified: Secondary | ICD-10-CM | POA: Insufficient documentation

## 2010-10-30 DIAGNOSIS — M069 Rheumatoid arthritis, unspecified: Secondary | ICD-10-CM | POA: Insufficient documentation

## 2010-10-30 DIAGNOSIS — E785 Hyperlipidemia, unspecified: Secondary | ICD-10-CM | POA: Insufficient documentation

## 2010-10-30 DIAGNOSIS — R110 Nausea: Secondary | ICD-10-CM | POA: Insufficient documentation

## 2010-10-30 DIAGNOSIS — R109 Unspecified abdominal pain: Secondary | ICD-10-CM | POA: Insufficient documentation

## 2010-10-30 DIAGNOSIS — K219 Gastro-esophageal reflux disease without esophagitis: Secondary | ICD-10-CM | POA: Insufficient documentation

## 2010-10-30 LAB — CBC
Hemoglobin: 12.9 g/dL (ref 12.0–15.0)
MCH: 31 pg (ref 26.0–34.0)
MCHC: 33.9 g/dL (ref 30.0–36.0)
MCV: 91.3 fL (ref 78.0–100.0)
RBC: 4.16 MIL/uL (ref 3.87–5.11)

## 2010-10-30 LAB — URINALYSIS, ROUTINE W REFLEX MICROSCOPIC
Bilirubin Urine: NEGATIVE
Glucose, UA: NEGATIVE mg/dL
Specific Gravity, Urine: 1.005 (ref 1.005–1.030)
pH: 8 (ref 5.0–8.0)

## 2010-10-30 LAB — COMPREHENSIVE METABOLIC PANEL
AST: 21 U/L (ref 0–37)
Albumin: 3.7 g/dL (ref 3.5–5.2)
CO2: 30 mEq/L (ref 19–32)
Calcium: 9.9 mg/dL (ref 8.4–10.5)
Creatinine, Ser: 0.72 mg/dL (ref 0.50–1.10)
GFR calc non Af Amer: >60 mL/min (ref >60)
Total Protein: 6.5 g/dL (ref 6.0–8.3)

## 2010-10-30 LAB — URINE MICROSCOPIC-ADD ON

## 2010-10-30 LAB — DIFFERENTIAL
Lymphs Abs: 2.1 10*3/uL (ref 0.7–4.0)
Monocytes Absolute: 0.9 10*3/uL (ref 0.1–1.0)
Monocytes Relative: 12 % (ref 3–12)
Neutro Abs: 5 10*3/uL (ref 1.7–7.7)
Neutrophils Relative %: 61 % (ref 43–77)

## 2010-10-30 NOTE — Telephone Encounter (Signed)
The patient's husband called to report he took her to the ER.  I have left him another message asking him to let me know what they found.

## 2010-10-30 NOTE — Telephone Encounter (Signed)
Left message for patient to call back  

## 2010-11-02 NOTE — Telephone Encounter (Signed)
I spoke with the patient this am she reports she feels much better.  She was told she has reflux.  She will keep her appt on Friday with Dr Leone Payor

## 2010-11-05 ENCOUNTER — Telehealth (INDEPENDENT_AMBULATORY_CARE_PROVIDER_SITE_OTHER): Payer: Self-pay

## 2010-11-05 NOTE — Telephone Encounter (Signed)
I called pt to notify her the Oxycodone script is ready for pick up at the front desk.  I told her Dr Dwain Sarna said this will be her last RX and she should follow up with her medical md about this pain.

## 2010-11-05 NOTE — Telephone Encounter (Signed)
Gave prescription for 20 oxycodone 7.5/500.  This is last rx she should receive from practice.Her pain may not be from her gallbladder and should return to see her primary care physician.

## 2010-11-05 NOTE — Telephone Encounter (Signed)
Pt called requesting more Oxycodone 7.5/500.  She was released from the hospital last Friday.  She had her gb removed.  I paged Dr Dwain Sarna and he can write the script when he comes in today or she can have Hydrocodone protocol.  I notified pt and she is unsure if she can take the Hydrocodone so we will have the dr write for Oxycodone.  She will have her son pick it up.

## 2010-11-06 ENCOUNTER — Encounter: Payer: Self-pay | Admitting: Internal Medicine

## 2010-11-06 ENCOUNTER — Telehealth: Payer: Self-pay | Admitting: *Deleted

## 2010-11-06 ENCOUNTER — Ambulatory Visit (INDEPENDENT_AMBULATORY_CARE_PROVIDER_SITE_OTHER): Payer: Medicare Other | Admitting: Internal Medicine

## 2010-11-06 ENCOUNTER — Ambulatory Visit (INDEPENDENT_AMBULATORY_CARE_PROVIDER_SITE_OTHER)
Admission: RE | Admit: 2010-11-06 | Discharge: 2010-11-06 | Disposition: A | Discharge status: Home / Self Care | Payer: Medicare Other | Source: Ambulatory Visit | Attending: Internal Medicine | Admitting: Internal Medicine

## 2010-11-06 VITALS — BP 118/60 | HR 76 | Ht 62.0 in | Wt 124.0 lb

## 2010-11-06 DIAGNOSIS — M546 Pain in thoracic spine: Secondary | ICD-10-CM

## 2010-11-06 DIAGNOSIS — R0789 Other chest pain: Secondary | ICD-10-CM

## 2010-11-06 NOTE — Telephone Encounter (Signed)
Spoke with patient. She denies any warmth, redness or swelling at the incision site and she also denies fever. She just says she hurts and the surgeon won't prescribe anymore pain meds. She said she has a GI appt today at 3 and that it will probably be after 5 before she gets home from that, so you can still call her when you're finished today.

## 2010-11-06 NOTE — Telephone Encounter (Signed)
Please make sure she has informed her surgeon of this  ? If redness at incision / fever or other symptoms  Let her know it will be end of the day before I can call her back

## 2010-11-06 NOTE — Telephone Encounter (Signed)
Patient is requesting a phone call from Dr. Milinda Antis.  She stated that she had her gallbladder removed on 10/23/2010 at [facility] and she continues to be in pain.  Advised patient that Dr. Milinda Antis may have her nurse to call to get more information or she may contact her between patients, at lunch, or by the end of the day.  Please advise.

## 2010-11-06 NOTE — Patient Instructions (Signed)
Please go to the basement upon leaving today to have your xray done.

## 2010-11-07 ENCOUNTER — Encounter: Payer: Self-pay | Admitting: Internal Medicine

## 2010-11-07 DIAGNOSIS — M546 Pain in thoracic spine: Secondary | ICD-10-CM | POA: Insufficient documentation

## 2010-11-07 DIAGNOSIS — R0789 Other chest pain: Secondary | ICD-10-CM | POA: Insufficient documentation

## 2010-11-07 NOTE — Assessment & Plan Note (Addendum)
Complicated situation in an elderly woman -  Bilateral pain that radiates from back. Constant and burning - neuropathic in character with mid-thoracic dermatomal distribution. Cause is not clear but do not think it is GI in origin. Cholecystectomy did not help. CT unrevealing. Thoracic spine film today unrevealing.  ? What next diagnostic step would be - ? MRI thoracic spine. Will consider EGD but with months of pain and normal Hgb and no other GI sxs really think it will be unrevealing. Given the problems this pain is causing her may need to do that. ? CT chest  Continue narcotics for now - may need low-dose tricyclic vs. Other long-term pain medication (non-narcotic). Her age is a risk factor for side effects but may be no other choice unless there is an intervention.  Will review films with radiology next. I have looked at her xrays and reviewed labs, hospital and outpatient notes as well as pathology.  09/25/10 CXR report found after signing encounter - I think we need to look into this further despite "negative" thoracic spine films today 2. Mild compression of the T9 vertebral body is stable and did not  appear acute by CT recently. If there is suspicion of acute or  occult thoracic compression fracture, spine MRI or nuclear medicine  would evaluate further.

## 2010-11-07 NOTE — Assessment & Plan Note (Addendum)
She and family are telling her pain originates here and radiates to lower chest. T-spine films did not show a cause today. Will explain to her - review films with radiology (all) and determine next step.  09/25/10 CXR report found after signing encounter - I think we need to look into this further despite "negative" thoracic spine films today 2. Mild compression of the T9 vertebral body is stable and did not  appear acute by CT recently. If there is suspicion of acute or  occult thoracic compression fracture, spine MRI or nuclear medicine  would evaluate further.

## 2010-11-07 NOTE — Progress Notes (Signed)
  Subjective:    Patient ID: Chloe Cooper, female    DOB: 1916/03/13, 77 y.o.   MRN: [id number]  HPI88 yo married white woman here with her husband and son. She is complaining of months of burning pain that starts in the mid back and radiates around both sides of lower chest, below the breasts. It is constant for the most part and no triggers are identifies. She relates the onset to "shots in my hips" performed by an orthopedist. She had seen Dr. Cleophas Dunker for hip pain and says Dr. Alvester Morin injected her hips. She points to the sacral or lower lumbar area when asked where she was injected. Hip pain seemed to improve.  This other pain is helped by narcotics. She has been hospitalized and had a laparoscopic cholecystectomy and admitted for the pain itself. The cholecystectomy (gallstone) was also unhelpful. It has been very frustrating. It is reminiscent of shingles pain (unilateral on right but similar distribution) she had many years ago. There has been no rash. There is no heartburn, dysphagia, bowel habit changes or other GI symptoms.  Past Medical History  Diagnosis Date  . GERD (gastroesophageal reflux disease)   . Osteoporosis   . 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 Wrist fracture, right 2008  . Diverticulitis 2009  . Gallstones 2009  . Hyperlipidemia   . Degenerative disc disease, lumbar     some spinal stenosis  . Shingles   . Baker's cyst   . Recurrent UTI    Past Surgical History  Procedure Date  . Appendectomy   . Partial hysterectomy   . Cholecystectomy 2012     reports that she has never smoked. She has never used smokeless tobacco. She reports that she does not drink alcohol or use illicit drugs. family history includes Diverticulitis in her sister and Heart disease in her brothers and father. Allergies  Allergen Reactions  . Alendronate Sodium Swelling    REACTION: GI  . Penicillins     REACTION: rash  . Sulfonamide Derivatives     REACTION: rash        Review of Systems Fatigue, sleep disturbance and those things mentioned in HPI All other ROs negative    Objective:   Physical Exam General: Well-developed, well-nourished and in no acute distress - elderly uses a walker Vitals: Reviewed and listed above Eyes:anicteric. Mouth and posterior pharynx: normal.  Neck: supple w/o thyromegaly or mass.  Lungs: clear. Heart: S1S2, no rubs, murmurs, gallops. Abdomen: soft, non-tender, no hepatosplenomegaly, hernia, or mass and BS+.  Chest wall:  nontender Lymphatics: no cervical, [place] or inguinal nodes. Extremities:  no edema Musculoskeletal: back, spine are nontender - mild scoliosis Skin no rash. Neuro: nonfocal. A&O x 3.  Psych: appropriate mood and  affect.        Assessment & Plan:

## 2010-11-09 NOTE — Telephone Encounter (Signed)
Please let her know I did try to call her several times but I don't think I caught her before returning from GI and then over the weekend no answer I did get to rev her GI notes which were helpful - and he recommended considering MRI of the spine  Is that something she would be agreeable to? - we could schedule that and then f/u with me afterwards

## 2010-11-10 NOTE — Progress Notes (Signed)
Quick Note:  Let her know this was ok but prior CXR has suggested compression fracture in T spine at t-9 Chart review shows that Dr. Royden Purl office has been talking to her also I think she should have the T-spine MRI and it looks like Dr. Royden Purl office is willing to schedule that so should do that next Remote possibility of needing an EGD but would work up the spine first  I have cced PCP office also ______

## 2010-11-10 NOTE — Telephone Encounter (Signed)
Patient advised as instructed via telephone, she stated that she had 3  MRI's done at the hospital last week.  I tried to clarify with patient if she had xrays or MRI and she stated it was a MRI.  She is waiting for the GI doctor to call her today.

## 2010-11-10 NOTE — Telephone Encounter (Signed)
Ok - I will wait for update from her on those-thanks

## 2010-11-10 NOTE — Progress Notes (Signed)
Quick Note:  I think she may be confused as it sounded like the injections were near her sacrum when I spoke to her.. She should have the MRI and hope that Dr. Milinda Antis is ordering. May be sonething else going on - T 9 may not correlate with location of sxs but doubt a GI issue. Agree with ccing Dr. Cleophas Dunker ______

## 2010-11-10 NOTE — Telephone Encounter (Signed)
Called patient x 2 at home number and line was busy both times, will call again later.

## 2010-11-11 ENCOUNTER — Telehealth: Payer: Self-pay | Admitting: *Deleted

## 2010-11-11 DIAGNOSIS — R0789 Other chest pain: Secondary | ICD-10-CM

## 2010-11-11 DIAGNOSIS — M546 Pain in thoracic spine: Secondary | ICD-10-CM | POA: Insufficient documentation

## 2010-11-11 MED ORDER — OXYCODONE-ACETAMINOPHEN 7.5-500 MG PO TABS: 1.0000 | ORAL_TABLET | Freq: Four times a day (QID) | ORAL | Status: DC | PRN

## 2010-11-11 NOTE — Telephone Encounter (Signed)
That is what his note said and I agree I will order MRI of TS and please let her know pt care coordinator will call her

## 2010-11-11 NOTE — Telephone Encounter (Signed)
Patient notified as instructed by telephone. Pt said she has 4 pills left of Oxycodone-APAP 7.5-500mg  taking 1 tab q 4-6 h as needed for severe pain. Pt said Dr Dwain Sarna the surgeon gave her this rx for pain but he will not refill it for her pain around the waistline. Pt said the 5-325mg  did not help. Pt wants to be called today when rx is ready for pick up.

## 2010-11-11 NOTE — Telephone Encounter (Signed)
Patient notified as instructed by telephone. Prescription left at front desk. Spoke with Shirlee Limerick, pt care coordinator and she will schedule appt after MRI scheduled.

## 2010-11-11 NOTE — Telephone Encounter (Signed)
Px printed for pick up in IN box  Please make sure she uses caution with this and only uses if absolutely needed - cut in 1/2 when she can- is sedating and habit forming and I usually do not use this strong a med  Please also schedule her f/u with me about a week after her MRI thanks

## 2010-11-11 NOTE — Telephone Encounter (Signed)
Pt states she saw Dr. Leone Payor who recommended she have MRI for her back pain.  He did x-ray which showed compression fracture.  She prefers to go to [facility] for the test.

## 2010-11-12 ENCOUNTER — Encounter (INDEPENDENT_AMBULATORY_CARE_PROVIDER_SITE_OTHER): Payer: Self-pay | Admitting: General Surgery

## 2010-11-12 ENCOUNTER — Ambulatory Visit (INDEPENDENT_AMBULATORY_CARE_PROVIDER_SITE_OTHER): Payer: Medicare Other | Admitting: General Surgery

## 2010-11-12 VITALS — BP 124/54 | HR 72

## 2010-11-12 DIAGNOSIS — K8020 Calculus of gallbladder without cholecystitis without obstruction: Secondary | ICD-10-CM

## 2010-11-12 DIAGNOSIS — M546 Pain in thoracic spine: Secondary | ICD-10-CM

## 2010-11-12 NOTE — Patient Instructions (Signed)
Continue with all of your present medications to try to oral acid Maalox to see whether your indigestion is relieved. Probably there was a problem in addition to your gallstones causing the pain. Thank Dr. Lucretia Roers is working on the right track trying to see if there is a back problem its giving used bilateral rib pain. An old x-ray did show a compression fracture at T9 and that might be contributing to the present pain.

## 2010-11-12 NOTE — Progress Notes (Signed)
Subjective:     Patient ID: Chloe Cooper, female   DOB: 1917/01/17, 77 y.o.   MRN: [id number]  HPIThe patient underwent a laparoscopic cholecystectomy Dr. Dwain Sarna who is on vacation and she's CMV since I discharged her home they can Renz one week in turn the patient continues to have the pain can in the lower rib cage area that she had preoperatively and does not appear to have any change in her pain after having her gallbladder removed she had had multiple admissions and numerous physician visits and Dr. Chriss Driver note preoperatively noted that she had a TEE 9 compression fracture that was thought to be old and she has seen Dr. Milinda Antis her regular physician since her release from current hospital who has her scheduled for a diet MRI later this week. She is accompanied by a son and husband and they are disappointed that her pain was not relieved with her cholecystectomy. Her path report did show gallstones but no acute inflammation. Her abdomen is soft her incisions are healed nicely he then noted the supraglue is still in place. There is no evidence of any acute tenderness in the right upper quadrant and she has breath sounds bilaterally I reviewed the x-rays with the patient and her son. She has a history of esophageal reflux GERD appears has not had an upper endoscopy but she's also had no GI bleeding she is presently on antireflux medication and I would suggest that she gets some Maalox and see whether that affects her pain one way or another. In retrospect I expected her gallbladder was not cause of the pain that she was described in the she states the pain has always been bilateral lower chest wall pain and burping.  Review of Systems     Objective:   Physical ExamHer laparoscopic cholecystectomy incisions are healing nicely no tenderness in the right upper quadrant and no acute surgical problem     Assessment:    The patient was evaluated very thoroughly preoperatively and has never noted a  mass in her pancreas during the multiple CAT scans or at the time of laparoscopic cholecystectomy. She has lost a few pounds continues on Percocet postoperatively but is being evaluated with the MRI in the next few days.     Plan:     She was not given a return appointment to see [place] and she is being evaluated for so many physicians and hopefully someone will find a problem that can be managed or corrected.

## 2010-11-13 ENCOUNTER — Ambulatory Visit
Admission: RE | Admit: 2010-11-13 | Discharge: 2010-11-13 | Disposition: A | Discharge status: Home / Self Care | Payer: Medicare Other | Source: Ambulatory Visit | Attending: Family Medicine | Admitting: Family Medicine

## 2010-11-13 DIAGNOSIS — R0789 Other chest pain: Secondary | ICD-10-CM

## 2010-11-13 DIAGNOSIS — M546 Pain in thoracic spine: Secondary | ICD-10-CM

## 2010-11-16 ENCOUNTER — Other Ambulatory Visit: Payer: Self-pay

## 2010-11-16 MED ORDER — OXYCODONE-ACETAMINOPHEN 7.5-500 MG PO TABS: 1.0000 | ORAL_TABLET | Freq: Four times a day (QID) | ORAL | Status: DC | PRN

## 2010-11-16 NOTE — Telephone Encounter (Signed)
Pt said pain from under both breast and goes all the way around her body is worse since had MRI on Friday. Pt wants Oxycodone APAP 7.5-500 mg refilled. Last filled 11-11-10 and pt was taking 1 tab q 4-6 hrs for pain. Pt said since MRI on Fri pt has to take med q4h. Pt said she has 4 pills left and wants rx to be picked up today. Please advise. Please call pt with answer or to pick up rx at [phone].

## 2010-11-16 NOTE — Telephone Encounter (Signed)
Patient notified as instructed by telephone. Prescription left at front desk.  

## 2010-11-16 NOTE — Telephone Encounter (Signed)
Reviewed last surg and GI note as well as recent MRI - no compression fx. Will refill percocets while pt gets in to see PCP next week to discuss other pain management strategies. Again please advise to use as sparingly as possible, use ice/heat if has not tried, used ibuprofen first. Only use every 6 hours, not 4 hours.

## 2010-11-19 ENCOUNTER — Telehealth: Payer: Self-pay

## 2010-11-19 NOTE — Telephone Encounter (Signed)
Discussed Dr Marvell Fuller recommendations.  She is not sue if she want's to schedule at this time.  She says her son is having open heart surgery tomorrow and she is not able to make a decision at this time.  She will call back if she wants to proceed.

## 2010-11-19 NOTE — Telephone Encounter (Signed)
Ok, thanks.

## 2010-11-19 NOTE — Telephone Encounter (Signed)
Message copied by Annett Fabian on Thu Nov 19, 2010 10:16 AM ------      Message from: Stan Head E      Created: Wed Nov 18, 2010  4:55 PM      Regarding: FW: chest pain       Call patient and arrange EGD to evaluate chest pain if she is agreeable            May use a hospital slot next week      ----- Message -----         From: Roxy Manns, MD         Sent: 11/18/2010   3:41 PM           To: Iva Boop, MD      Subject: RE: chest pain                                           That is fine - I appreciate your thoughts       ----- Message -----         From: Iva Boop, MD         Sent: 11/18/2010  12:05 PM           To: Roxy Manns, MD      Subject: chest pain                                               Saw MR results      I do not understand her chest pain though it did sound neuropathic      At this point I think reasonable for EGD though suspect it will be normal - we will call her and arrange unless you think otherwise            Baldo Ash

## 2010-11-20 ENCOUNTER — Telehealth: Payer: Self-pay | Admitting: Internal Medicine

## 2010-11-20 NOTE — Telephone Encounter (Signed)
Phone is busy I will continue to try and reach the patient  

## 2010-11-20 NOTE — Telephone Encounter (Signed)
the patient has decided that she will go ahead and schedule EGD as recommended.  She is scheduled for 12/01/10 2:00 and pre-visit 11/24/10 3:30

## 2010-11-23 ENCOUNTER — Encounter: Payer: Self-pay | Admitting: Family Medicine

## 2010-11-23 ENCOUNTER — Ambulatory Visit (INDEPENDENT_AMBULATORY_CARE_PROVIDER_SITE_OTHER): Payer: Medicare Other | Admitting: Family Medicine

## 2010-11-23 DIAGNOSIS — R0789 Other chest pain: Secondary | ICD-10-CM

## 2010-11-23 DIAGNOSIS — M546 Pain in thoracic spine: Secondary | ICD-10-CM

## 2010-11-23 NOTE — Patient Instructions (Signed)
Go ahead and follow up for your upper endoscopy as planned  If that does not show any reason for your pain - then I will have you see either Dr Cleophas Dunker (I will send him a copy of your MRI), or possibly a neurologist to keep investigating  Call when you need pain medicine refilled and be careful of falls with it

## 2010-11-23 NOTE — Progress Notes (Signed)
Subjective:    Patient ID: Chloe Cooper, female    DOB: 06-04-1916, 77 y.o.   MRN: [id number]  HPI Here for ongoing pain that has persisted beyond having her ccy Saw GI as well - suspected it was MSK in nature   Had MRI of TS No comp fx, no cord lesion , some deg disc dz - not severe  Gi did offer to do endoscopy- has that all set up   On percocet 7.5 due to pain   Still has pain under her breasts and over her ribs  Cannot stand for skin to touch skin No breaking out  It hurts so bad she needs the pain med -- tries to wait at least 5 hours in between doses  occ aleve- not that often  Pain at night wakes her up  Pain is all the way through to her back  Pain describes as a buring pain - not very deep   Overall has had for about 3 months - is not getting better   Is sore in epigastric area  No change with eating  Appetite is slowly improving - did eat breakfast this am - bacon and egg  No nausea in the last 2 day- but she was before then  Taking metamucil for her constipation  This does help   Patient Active Problem List  Diagnoses  . UNSPECIFIED VITAMIN D DEFICIENCY  . HYPERCHOLESTEROLEMIA  . ESSENTIAL HYPERTENSION, BENIGN  . OSTEOARTHRITIS  . OSTEOPOROSIS  . Dizziness  . Leg cramps  . Osteoarthritis  . UTI (lower urinary tract infection)  . Constipation  . Shortness of breath  . Thoracic back pain  . Burning chest pain  . Back pain, thoracic   Past Medical History  Diagnosis Date  . GERD (gastroesophageal reflux disease)   . Osteoporosis   . 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 Wrist fracture, right 2008  . Diverticulitis 2009  . Gallstones 2009  . Hyperlipidemia   . Degenerative disc disease, lumbar     some spinal stenosis  . Shingles   . Baker's cyst   . Recurrent UTI   . Cataract    Past Surgical History  Procedure Date  . Appendectomy   . Partial hysterectomy   . Cholecystectomy 2012   . 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 Trigger finger release     bil hands  . Breast surgery     cyst removed left breast  . Cataracts surg     bil   History  Substance Use Topics  . Smoking status: Never Smoker   . Smokeless tobacco: Never Used  . Alcohol Use: No   Family History  Problem Relation Age of Onset  . Heart disease Father     CAD  . Heart disease Brother     CAD  . Heart disease Brother     CAD  . Diverticulitis Sister    Allergies  Allergen Reactions  . Alendronate Sodium Swelling    REACTION: GI  . Penicillins     REACTION: rash  . Sulfonamide Derivatives     REACTION: rash   Current Outpatient Prescriptions on File Prior to Visit  Medication Sig Dispense Refill  . aspirin 81 MG tablet Take 81 mg by mouth daily.       Marland Kitchen azelastine (OPTIVAR) 0.05 % ophthalmic solution Place 1 drop into both eyes daily.       Marland Kitchen  ondansetron (ZOFRAN) 8 MG tablet Take by mouth as needed.        . simvastatin (ZOCOR) 80 MG tablet Take 0.5 tablets (40 mg total) by mouth every evening.  30 tablet  0  . Cholecalciferol (VITAMIN D) 2000 UNITS CAPS Take 1 capsule by mouth daily.        Marland Kitchen ibuprofen (ADVIL,MOTRIN) 200 MG tablet OTC as directed.       . ranitidine (ZANTAC) 75 MG tablet Take 75 mg by mouth 2 (two) times daily.                Review of Systems Review of Systems  Constitutional: Negative for fever, appetite change, fatigue and unexpected weight change.  Eyes: Negative for pain and visual disturbance.  Respiratory: Negative for cough and shortness of breath.   Cardiovascular: Negative for palpitations/ sob on exertion or leg edema    Gastrointestinal: Negative for nausea, diarrhea and constipation.  Genitourinary: Negative for urgency and frequency.  Skin: Negative for pallor or rash   Neurological: Negative for weakness, light-headedness, numbness and headaches.  Hematological: Negative for adenopathy. Does not bruise/bleed easily.  Psychiatric/Behavioral: Negative for  dysphoric mood. The patient is not nervous/anxious.          Objective:   Physical Exam  Constitutional: She appears well-developed and well-nourished. No distress.  HENT:  Head: Normocephalic and atraumatic.  Mouth/Throat: Oropharynx is clear and moist.  Eyes: Conjunctivae and EOM are normal. Pupils are equal, round, and reactive to light.  Neck: Normal range of motion. Neck supple. No JVD present. Carotid bruit is not present. No thyromegaly present.  Cardiovascular: Normal rate, regular rhythm, normal heart sounds and intact distal pulses.   Pulmonary/Chest: Effort normal and breath sounds normal. No respiratory distress. She has no wheezes.       Tender over and under anterior ribs bilaterally No crepitice or skin change   Abdominal: Soft. Bowel sounds are normal. She exhibits no distension, no abdominal bruit and no mass. There is tenderness. There is no rebound and no guarding.       Tender just under ribs over entire anterior abdomen No cva tenderness   Musculoskeletal: Normal range of motion. She exhibits tenderness. She exhibits no edema.       Some tenderness over TS Fairly good rom  Gait is labored from pain, however  Neg slr  Tender on and below anterior ribs   Lymphadenopathy:    She has no cervical adenopathy.  Neurological: She is alert. She has normal strength and normal reflexes. She displays no tremor. No cranial nerve deficit or sensory deficit. She exhibits normal muscle tone. She displays a negative Romberg sign. Coordination normal.  Skin: Skin is warm and dry. No rash noted. No erythema. No pallor.       Tender to the touch around lower ribs - but no rash or skin change/ breakdown   Psychiatric: She has a normal mood and affect.          Assessment & Plan:

## 2010-11-24 ENCOUNTER — Ambulatory Visit (AMBULATORY_SURGERY_CENTER): Payer: Medicare Other

## 2010-11-24 VITALS — Ht 62.0 in | Wt 122.6 lb

## 2010-11-24 DIAGNOSIS — R0789 Other chest pain: Secondary | ICD-10-CM

## 2010-11-24 DIAGNOSIS — R52 Pain, unspecified: Secondary | ICD-10-CM

## 2010-11-24 DIAGNOSIS — M546 Pain in thoracic spine: Secondary | ICD-10-CM

## 2010-11-25 ENCOUNTER — Other Ambulatory Visit: Payer: Self-pay | Admitting: *Deleted

## 2010-11-25 MED ORDER — OXYCODONE-ACETAMINOPHEN 7.5-500 MG PO TABS: 1.0000 | ORAL_TABLET | Freq: Four times a day (QID) | ORAL | Status: DC | PRN

## 2010-11-25 NOTE — Telephone Encounter (Signed)
Patient notified as instructed by telephone. Prescription left at front desk.  

## 2010-11-25 NOTE — Telephone Encounter (Signed)
Px printed for pick up in IN box  

## 2010-11-25 NOTE — Telephone Encounter (Signed)
Please call pt when ready, I told her it would be tomorrow.

## 2010-11-26 NOTE — Assessment & Plan Note (Signed)
This seems to correspond to burning chest and abd pain - but less severe Rev MRI of spine and other studies  Pt will have EGD to investigate and then update me if cause not found - consider neruol ref  Will update if symptoms worsen and continue percocet

## 2010-11-26 NOTE — Assessment & Plan Note (Signed)
This is on going even after ccy (when gallstones were thought to be the cause) No GI cause found but has upper endoscopy planned soon  TS MRI showed the expected amount of degenerative change for her age  Will continue tx with percocet - with caution If EGD is nl will consider neuro consult Again did remind pt to call if there is a rash

## 2010-11-27 LAB — URINALYSIS, ROUTINE W REFLEX MICROSCOPIC
Nitrite: NEGATIVE
Protein, ur: NEGATIVE
Specific Gravity, Urine: 1.008
Urobilinogen, UA: 1

## 2010-11-27 LAB — I-STAT 8, (EC8 V) (CONVERTED LAB)
Bicarbonate: 24
Glucose, Bld: 98
HCT: 40
Hemoglobin: 13.6
Operator id: 285491
Potassium: 3.8
Sodium: 138
TCO2: 25

## 2010-11-27 LAB — URINE MICROSCOPIC-ADD ON

## 2010-11-27 LAB — COMPREHENSIVE METABOLIC PANEL
AST: 17
Albumin: 3.8
BUN: 15
Calcium: 9.1
Creatinine, Ser: 0.7
GFR calc Af Amer: >60
GFR calc non Af Amer: >60

## 2010-11-27 LAB — DIFFERENTIAL
Basophils Absolute: 0
Basophils Relative: 0
Eosinophils Absolute: 0
Neutro Abs: 13.4 — ABNORMAL HIGH
Neutrophils Relative %: 91 — ABNORMAL HIGH

## 2010-11-27 LAB — CBC
MCHC: 33.5
MCV: 91.1
Platelets: 185

## 2010-12-01 ENCOUNTER — Encounter: Payer: Self-pay | Admitting: Internal Medicine

## 2010-12-01 ENCOUNTER — Ambulatory Visit (AMBULATORY_SURGERY_CENTER): Payer: Medicare Other | Admitting: Internal Medicine

## 2010-12-01 VITALS — BP 128/77 | HR 80 | Temp 96.3°F | Resp 16 | Ht 62.0 in | Wt 122.0 lb

## 2010-12-01 DIAGNOSIS — IMO0002 Reserved for concepts with insufficient information to code with codable children: Secondary | ICD-10-CM

## 2010-12-01 DIAGNOSIS — R0789 Other chest pain: Secondary | ICD-10-CM

## 2010-12-01 DIAGNOSIS — R52 Pain, unspecified: Secondary | ICD-10-CM

## 2010-12-01 MED ORDER — SODIUM CHLORIDE 0.9 % IV SOLN: 500.0000 mL | INTRAVENOUS | Status: DC

## 2010-12-01 NOTE — Progress Notes (Signed)
Addended by: Marcy Salvo on: 12/01/2010 04:20 PM   Modules accepted: Orders

## 2010-12-01 NOTE — Patient Instructions (Addendum)
The endoscopy looked ok. No cause of your pain was found. Sometimes pain like this can respond to low-doses of anti-depressants that block the pain signals being sent to the brain. I cannot explain why you hurt but we have not found anything bad. Please see Dr. Milinda Antis again about your pain - she can consider prescribing a drug like nortriptylline or amitriptylline for the pain. I would want her to do this and follow you as these drugs may make you sleepy and dizzy or light-headed, so close follow-up given your age is appropriate.  I do recommend you take Prilosec OTC 20 mg daily for a month to see if it helps, unless you already did that or a similar drug.  Iva Boop, MD, [facility]  Please follow all discharge instructions given to you by the recovery room nurse. If you have any questions or problems after discharge please call [phone]. You will receive a phone call in the am to see how you are doing and answer any questions you may have. Thank you for choosing [facility] for your health care needs.

## 2010-12-02 ENCOUNTER — Telehealth: Payer: Self-pay | Admitting: *Deleted

## 2010-12-02 MED ORDER — OXYCODONE-ACETAMINOPHEN 7.5-500 MG PO TABS: 1.0000 | ORAL_TABLET | Freq: Four times a day (QID) | ORAL | Status: DC | PRN

## 2010-12-02 NOTE — Telephone Encounter (Signed)
Tried to call pt and line busy x 2. Will try again. 

## 2010-12-02 NOTE — Telephone Encounter (Signed)
Follow up Call- Patient questions:  Do you have a fever, pain , or abdominal swelling? no Pain Score  0 *  Have you tolerated food without any problems? yes  Have you been able to return to your normal activities? yes  Do you have any questions about your discharge instructions: Diet   no Medications  no Follow up visit  no  Do you have questions or concerns about your Care? no  Actions: * If pain score is 4 or above: No action needed, pain <4.  Pt states she is still having the same pain they have been checking for for the past 4 months but no complaints or concerns related to her procedure. Discussed that Dr. Leone Payor had advised her to start a PPI and see how she is doing in 1 month. Advised that if she is still having discomfort in 1 month to call the office and setup another appointment with Dr. Leone Payor to discuss.

## 2010-12-02 NOTE — Telephone Encounter (Signed)
Pt has appt to see you on Tuesday but she doesn't have enough oxycodone to last until then.  She is asking if she can have a refill.  She is coming in to discuss medicine that Dr. Leone Payor has suggested she take.

## 2010-12-02 NOTE — Telephone Encounter (Signed)
Px printed for pick up in IN box  

## 2010-12-03 NOTE — Telephone Encounter (Signed)
Advised pt that script is ready for pick up, script placed up front. 

## 2010-12-07 ENCOUNTER — Ambulatory Visit: Payer: Medicare Other | Admitting: Internal Medicine

## 2010-12-08 ENCOUNTER — Encounter: Payer: Self-pay | Admitting: Neurology

## 2010-12-08 ENCOUNTER — Ambulatory Visit (INDEPENDENT_AMBULATORY_CARE_PROVIDER_SITE_OTHER): Payer: Medicare Other | Admitting: Family Medicine

## 2010-12-08 ENCOUNTER — Encounter: Payer: Self-pay | Admitting: Family Medicine

## 2010-12-08 VITALS — BP 106/58 | HR 68 | Temp 98.6°F | Ht 62.0 in | Wt 123.8 lb

## 2010-12-08 DIAGNOSIS — R0789 Other chest pain: Secondary | ICD-10-CM

## 2010-12-08 DIAGNOSIS — M546 Pain in thoracic spine: Secondary | ICD-10-CM

## 2010-12-08 MED ORDER — AMITRIPTYLINE HCL 10 MG PO TABS: ORAL_TABLET | ORAL | Status: DC

## 2010-12-08 MED ORDER — OXYCODONE-ACETAMINOPHEN 7.5-500 MG PO TABS: 1.0000 | ORAL_TABLET | Freq: Four times a day (QID) | ORAL | Status: DC | PRN

## 2010-12-08 NOTE — Patient Instructions (Signed)
Start the amitriptylline 1 pill each bedtime for 1 week and then if doing ok increase it to 2 pills each bedtime I sent this to your pharmacy It may make you sleepy and give you a dry mouth- so be careful Also if this makes you feel depressed - stop it and let me know  You can continue the percocet as needed  We will refer you to neuology at check out  Follow up with me in about 4-6 weeks

## 2010-12-08 NOTE — Progress Notes (Signed)
Subjective:    Patient ID: Chloe Cooper, female    DOB: 1917-01-27, 77 y.o.   MRN: [id number]  HPI Here for f/u of persistant chest and back pain after a long work up  Most recently egd Dr Leone Payor - was ok  He recommended we disc tricyclic med for help with sleep and pain at this point    On percocet - high dose - and cannot miss a dose due to severe pain Her husband again mentions that her pain started after some epidural steroid injections   Now pain has spread all the way around a little bit lower - over upper abdomen Still no rash  Still stinging and burning - around rib cage  Worse around ccy incision   Is sleeping pretty well at night if she takes the percocet  Takes percocet at 11 and then 5 and then 10 pm and again at 6 am    Is using a walker to ambulate with a seat and wheels   No new symptoms  Appetite is better - has gained a lb  No more nausea   Patient Active Problem List  Diagnoses  . UNSPECIFIED VITAMIN D DEFICIENCY  . HYPERCHOLESTEROLEMIA  . ESSENTIAL HYPERTENSION, BENIGN  . OSTEOARTHRITIS  . OSTEOPOROSIS  . Dizziness  . Leg cramps  . Osteoarthritis  . UTI (lower urinary tract infection)  . Constipation  . Shortness of breath  . Thoracic back pain  . Burning chest pain  . Back pain, thoracic   Past Medical History  Diagnosis Date  . GERD (gastroesophageal reflux disease)   . Osteoporosis   . 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 Wrist fracture, right 2008  . Diverticulitis 2009  . Gallstones 2009  . Hyperlipidemia   . Degenerative disc disease, lumbar     some spinal stenosis  . Shingles   . Baker's cyst   . Recurrent UTI   . Cataract    Past Surgical History  Procedure Date  . Appendectomy   . Partial hysterectomy   . Cholecystectomy 2012   . 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 Trigger finger release     bil hands  . Breast surgery     cyst removed left breast  . Cataracts surg     bil    History  Substance Use Topics  . Smoking status: Never Smoker   . Smokeless tobacco: Never Used  . Alcohol Use: No   Family History  Problem Relation Age of Onset  . Heart disease Father     CAD  . Heart disease Brother     CAD  . Heart disease Brother     CAD  . Diverticulitis Sister    Allergies  Allergen Reactions  . Alendronate Sodium Swelling    REACTION: GI  . Penicillins     REACTION: rash  . Sulfonamide Derivatives     REACTION: rash   Current Outpatient Prescriptions on File Prior to Visit  Medication Sig Dispense Refill  . aspirin 81 MG tablet Take 81 mg by mouth daily.       Marland Kitchen azelastine (OPTIVAR) 0.05 % ophthalmic solution Place 1 drop into both eyes daily.       . Cholecalciferol (VITAMIN D) 2000 UNITS CAPS Take 1 capsule by mouth daily.        . ranitidine (ZANTAC) 75 MG tablet Take 75 mg by mouth 2 (two) times daily.        Marland Kitchen  simvastatin (ZOCOR) 80 MG tablet Take 0.5 tablets (40 mg total) by mouth every evening.  30 tablet  0  . alum & mag hydroxide-simeth (MAALOX PLUS) 400-400-40 MG/5ML suspension Take by mouth every 6 (six) hours as needed.        Marland Kitchen ibuprofen (ADVIL,MOTRIN) 200 MG tablet OTC as directed.       . naproxen sodium (ALEVE) 220 MG tablet Take 220 mg by mouth 2 (two) times daily with a meal.        . ondansetron (ZOFRAN) 8 MG tablet Take by mouth as needed.              Review of Systems Review of Systems  Constitutional: Negative for fever, appetite change, fatigue and unexpected weight change.  Eyes: Negative for pain and visual disturbance.  Respiratory: Negative for cough and shortness of breath.   Cardiovascular: pos for chest wall pain / no palpitations/ edema or sob    Gastrointestinal: Negative for nausea, diarrhea and constipation.  Genitourinary: Negative for urgency and frequency.  Skin: Negative for pallor or rash   MSK pos for back pain, no acute joint changes  Neurological: Negative for weakness, light-headedness,  numbness and headaches.  Hematological: Negative for adenopathy. Does not bruise/bleed easily.  Psychiatric/Behavioral: Negative for dysphoric mood. The patient is not nervous/anxious.          Objective:   Physical Exam  Constitutional: She appears well-developed and well-nourished. No distress.  HENT:  Head: Normocephalic and atraumatic.  Mouth/Throat: Oropharynx is clear and moist.  Eyes: Conjunctivae and EOM are normal. Pupils are equal, round, and reactive to light. No scleral icterus.  Neck: Normal range of motion. Neck supple. No JVD present. Carotid bruit is not present. No thyromegaly present.  Cardiovascular: Normal rate, regular rhythm, normal heart sounds and intact distal pulses.   Pulmonary/Chest: Effort normal and breath sounds normal. No respiratory distress. She has no wheezes. She has no rales. She exhibits tenderness.       Some pleuritic pain noted  Tender over lower ribs ant and posterior without skin change or crepitice   Abdominal: Soft. Bowel sounds are normal. She exhibits no distension, no abdominal bruit and no mass. There is tenderness. There is no rebound and no guarding.       Some epigastric tenderness  Musculoskeletal: She exhibits no edema.       No acute joint changes No spinal tenderness  Lymphadenopathy:    She has no cervical adenopathy.  Neurological: She is alert. She has normal reflexes. She exhibits normal muscle tone. Coordination normal.  Skin: Skin is warm and dry. No rash noted. No erythema. No pallor.  Psychiatric: She has a normal mood and affect.          Assessment & Plan:

## 2010-12-10 NOTE — Assessment & Plan Note (Addendum)
In addn to thoracic back pain Rev records- no imp with ccy and neg GI w/u and MRI of spine not compatible with pain level Pain is severe and requiring regular percocet  Cannot find etiol  Now pain is worsening on both sides Will have a trial of elavil carefully - titrating up to 20 mg at night (side eff disc) , for sleep and pain Also ref to neurologist  >25 min spent with face to face with patient, >50% counseling and/or coordinating care

## 2010-12-10 NOTE — Assessment & Plan Note (Signed)
See assessment for burning cp

## 2010-12-17 ENCOUNTER — Ambulatory Visit (INDEPENDENT_AMBULATORY_CARE_PROVIDER_SITE_OTHER): Payer: Medicare Other | Admitting: Neurology

## 2010-12-17 ENCOUNTER — Encounter: Payer: Self-pay | Admitting: Neurology

## 2010-12-17 ENCOUNTER — Other Ambulatory Visit: Payer: Self-pay | Admitting: Family Medicine

## 2010-12-17 VITALS — BP 120/78 | HR 80

## 2010-12-17 DIAGNOSIS — B0229 Other postherpetic nervous system involvement: Secondary | ICD-10-CM

## 2010-12-17 NOTE — Progress Notes (Signed)
Dear Dr. Milinda Antis,  Thank you for having me see Chloe Cooper in consultation today at [facility] for her problem with bilateral thoracic pain.  As you may recall, she is a 77 y.o. year old female with a history of lumbar spine disease who began having burning thoracic pain 1 week after an epidural steroid injection into her lumbar spine.  The pain is described as burning that radiates around the area below her breast.  She denies frank numbness.  She was seen multiple times in the E.D. and underwent a laparoscopic cholecystectomy as they though her pain might be her gall bladder.  Her pain was was made worse anytime the skin was touch.  A thoracic spine MRI was done which was unrevealing as to the cause of the pain.  Thinking that this was neuropathic pain you started her on Elavil 25mg  qhs about 1 week ago.  She recently increased this to 50mg  qhs.  She feels that the pain is much improved.  While she was taking Percocet 4 times per day, she now only has to take one tablet a day.  She is sleeping soundly and denies problems with the Elavil such as urinary retention, memory problems or confusion.  Interestingly, she has a history of shingles 40 years ago on the left in the approximately same area as the current pain - although the current pain is bilateral.  Past Medical History  Diagnosis Date  . GERD (gastroesophageal reflux disease)   . Osteoporosis   . 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 Wrist fracture, right 2008  . Diverticulitis 2009  . Gallstones 2009  . Hyperlipidemia   . Degenerative disc disease, lumbar     some spinal stenosis  . Shingles   . Baker's cyst   . Recurrent UTI   . Cataract   No diabetes  Past Surgical History  Procedure Date  . Appendectomy   . Partial hysterectomy   . Cholecystectomy 2012   . 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 Trigger finger release     bil hands  . Breast surgery     cyst removed left breast  .  Cataracts surg     bil    History   Social History  . Marital Status: Married    Spouse Name: N/A    Number of Children: N/A  . Years of Education: N/A   Occupational History  . Retired     Social History Main Topics  . Smoking status: Never Smoker   . Smokeless tobacco: Never Used  . Alcohol Use: No  . Drug Use: No  . Sexually Active: None   Other Topics Concern  . 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 Heart disease Father     CAD  . Heart disease Brother     CAD  . Heart disease Brother     CAD  . Diverticulitis Sister       ROS:  13 systems were reviewed and are notable for leg pain while walking, swelling in the hands and feet and difficulty with balance.  All other review of systems are unremarkable.   Examination:  Filed Vitals:   12/17/10 1409  BP: 120/78  Pulse: 80     In general, well appearing older woman sitting on a walker.  Skin exam:  no rash over area of complaint, ~T7 dermatome bilaterally.  Cardiovascular: The patient has a regular rate and rhythm and no carotid bruits.  Fundoscopy:  Disks are flat. Vessel caliber within normal limits.  Mental status:   The patient is oriented to person, place and time. Recent and remote memory are intact. Attention span and concentration are normal. Language including repetition, naming, following commands are intact. Fund of knowledge of current and historical events, as well as vocabulary are normal.  Cranial Nerves: Pupils are equally round and reactive to light. Visual fields full to confrontation. Extraocular movements are intact without nystagmus. Facial sensation and muscles of mastication are intact. Muscles of facial expression are symmetric. Hearing intact to bilateral finger rub. Tongue protrusion, uvula, palate midline.  Shoulder shrug intact  Motor:  The patient has normal bulk and tone, no pronator drift.  Jaw  tremor, mild bilateral postural arm tremor.   SA EF EE FA HF KF KE FDF FPF Right 5 5 5 5  4+ 5 5 5 5  Left 5 5 5 5  4+ 5 5 5 5   Reflexes:   Biceps  Triceps Brachioradialis Knee Ankle  Right 2+  2+  0   0 0  Left  1+  2+  0   0 0  Toes down  Coordination:  Normal finger to nose.  No dysdiadokinesia.  Sensation is intact to temperature and vibration.  Gait and Station are normal.  Tandem gait is intact.  Romberg is negative.  MRI thoracic spine report was reviewed and was unremarkable for cause of the pain.   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  There have been cases of this documented in the literature.  Despite she was injected in the lumbar spine the steroid could obvious track upwards and theoretically allow reactivation of her previous zoster this time involving bilateral dermatomes.   Recommendations: Elavil is appropriate.  Given she is still taking Percocet once per day you may want to try to increase her Elavil to 75mg (but I would not go any higher because of potential side effects.  If she runs into problems with side effects I would use gabapentin instead(if it has not been used already).  Lyrica is another option. If she has not had the shingles vaccine I would consider giving it to her.  We also called her pain doctor and ensured that her epidural steroid did not come from the recently "tainted" batch -- it did not.  I can see her back on a prn basis, if you or she has any questions.  Thank you for having [place] see Chloe Cooper in consultation.  Feel free to contact me with any questions.  Lupita Raider Modesto Charon, MD [facility], [place] [address] [place], [place] [address] Phone: [phone] Fax: [phone].

## 2010-12-17 NOTE — Telephone Encounter (Signed)
[facility] request refill on Simvastatin 80 mg with instructions to take one tablet daily. On 08/21/10 Dr Milinda Antis decreased Simvastatin 80 mg to take 1/2 tablet daily. When pt was seen in office 12/08/10 pt verified she was taking Simvastatin 80 mg taking 1/2 tablet by mouth daily. Unable to reach pt or pts family.Please advise.

## 2010-12-18 MED ORDER — SIMVASTATIN 80 MG PO TABS: 40.0000 mg | ORAL_TABLET | Freq: Every evening | ORAL | Status: DC

## 2010-12-18 NOTE — Telephone Encounter (Signed)
Pt confirmed she is taking simvastatin 40 mg once daily. RF sent in

## 2010-12-20 NOTE — Progress Notes (Signed)
Please let pt know that I reviewed her note from Dr Modesto Charon and am glad she is starting to feel better, please ask her if she wants to increase the amitriptyline to 75 mg or stay where it is for now  Please also send a copy of the note to the physician who did her last epidural injection  Thanks

## 2011-01-04 NOTE — Discharge Summary (Signed)
  NAMELUPIE, SAWA NO.:  [id number]  MEDICAL RECORD NO.:  [id number]  LOCATION:  5120                         FACILITY:  [facility]  PHYSICIAN:  Anselm Pancoast. Monea Pesantez, M.D.DATE OF BIRTH:  [birth year]  DATE OF ADMISSION:  10/22/2010 DATE OF DISCHARGE:  10/25/2010                              DISCHARGE SUMMARY   REASON FOR ADMISSION:  Chloe Cooper is a 77 year old female who began having low back pain as well as epigastric and right upper quadrant pain.  This was radiating to her back.  She was seen in our office and was scheduled to have an elective cholecystectomy by Dr. Michaell Cowing, however, the patient awoke the morning of admission with severe pain.  She presented to the emergency department for further evaluation.  She did have an outpatient ultrasound, which revealed gallstones.  ADMITTING DIAGNOSIS:  Symptomatic cholelithiasis.  HOSPITAL COURSE:  The patient was admitted.  The following day she was taken to the operating room where she had a laparoscopic cholecystectomy.  The patient tolerated this procedure well.  On postoperative day #1, the patient was doing fairly well.  She was tolerating a regular diet and otherwise stable for discharge home.  Her abdomen was stable and all incisions were clean, dry, and intact.  DISCHARGE DIAGNOSES: 1. Biliary colic. 2. Status post laparoscopic cholecystectomy.  DISCHARGE MEDICATIONS: 1. Vicodin. 2. Zofran as needed. 3. Zocor 40 mg daily.  DISCHARGE INSTRUCTIONS:  The patient is to return to our office in approximately 2 weeks with Dr. Dwain Sarna for a followup appointment. She is to resume a low-sodium heart-healthy diet.  She may shower.     Chloe Cape, PA   ______________________________ Anselm Pancoast. Zachery Dakins, M.D.    KEO/MEDQ  D:  11/30/2010  T:  11/30/2010  Job:  [id number]  Electronically Signed by Barnetta Chapel PA on 12/24/2010 04:52:57 PM Electronically Signed by Consuello Bossier M.D. on  01/04/2011 11:01:38 AM

## 2011-01-05 ENCOUNTER — Ambulatory Visit (INDEPENDENT_AMBULATORY_CARE_PROVIDER_SITE_OTHER): Payer: Medicare Other | Admitting: Family Medicine

## 2011-01-05 ENCOUNTER — Encounter: Payer: Self-pay | Admitting: Family Medicine

## 2011-01-05 VITALS — BP 110/64 | HR 76 | Temp 97.9°F | Ht 62.0 in | Wt 123.2 lb

## 2011-01-05 DIAGNOSIS — R0789 Other chest pain: Secondary | ICD-10-CM

## 2011-01-05 DIAGNOSIS — M546 Pain in thoracic spine: Secondary | ICD-10-CM

## 2011-01-05 DIAGNOSIS — N390 Urinary tract infection, site not specified: Secondary | ICD-10-CM

## 2011-01-05 DIAGNOSIS — R8290 Unspecified abnormal findings in urine: Secondary | ICD-10-CM

## 2011-01-05 DIAGNOSIS — R82998 Other abnormal findings in urine: Secondary | ICD-10-CM

## 2011-01-05 LAB — POCT URINALYSIS DIPSTICK
Bilirubin, UA: NEGATIVE
Ketones, UA: NEGATIVE
Spec Grav, UA: 1.01
pH, UA: 6.5

## 2011-01-05 LAB — POCT UA - MICROSCOPIC ONLY
Casts, Ur, LPF, POC: 0
Crystals, Ur, HPF, POC: 0
Yeast, UA: 0

## 2011-01-05 MED ORDER — AMITRIPTYLINE HCL 10 MG PO TABS: ORAL_TABLET | ORAL | Status: DC

## 2011-01-05 MED ORDER — OXYCODONE-ACETAMINOPHEN 7.5-500 MG PO TABS: 1.0000 | ORAL_TABLET | Freq: Every day | ORAL | Status: DC | PRN

## 2011-01-05 MED ORDER — CIPROFLOXACIN HCL 250 MG PO TABS: 250.0000 mg | ORAL_TABLET | Freq: Two times a day (BID) | ORAL | Status: AC

## 2011-01-05 NOTE — Assessment & Plan Note (Signed)
See eval for thoracic back pain

## 2011-01-05 NOTE — Assessment & Plan Note (Signed)
In conjunction with upper abd/ flank cw pain - thought to be 2ndary to re activation of shingles after epidural steroid injection Pt is responding well to elavil- will continue 20 mg dose  Is needing percocet only once daily now and will continue to wean from that as able  Warned again of poss sedation/ falls with both meds She uses walker at all times  Will f/u 3 mo or update sooner

## 2011-01-05 NOTE — Assessment & Plan Note (Signed)
New uti tx with cipro and cx  Atypical sympotms  If further problems will have to ref to urology as Dr Aldean Ast has retired Adv to update if worse/ any new back pain or fever or hematuria

## 2011-01-05 NOTE — Progress Notes (Signed)
Subjective:    Patient ID: Chloe Cooper, female    DOB: 11-12-1916, 77 y.o.   MRN: [id number]  HPI Here for f/u of burning chest/side back pain and also uti  Saw Dr Modesto Charon- neurology Decided that the pain could have been reactivation of old shingles virus in nerve root from epidural injection  On elavil which is working  Does not think we need to increase the dose of elavil Sleeping well  Still using percocet - takes it just one per day- that is down a lot  Still stings and burns around waistline   Uses a walker all the time for safety The elavil does not make her more dizzy or unstable   Had bad odor to urine  No frequency or pain or fever  Used to see Dr Aldean Ast -- is officially retire  Drinks a lot of water and other fluids - cranberry and tomato juice too  No nausea Feeling ok  ua is pos- for nit and leukocytes   Patient Active Problem List  Diagnoses  . UNSPECIFIED VITAMIN D DEFICIENCY  . HYPERCHOLESTEROLEMIA  . ESSENTIAL HYPERTENSION, BENIGN  . OSTEOARTHRITIS  . OSTEOPOROSIS  . Dizziness  . Leg cramps  . Osteoarthritis  . UTI (lower urinary tract infection)  . Constipation  . Shortness of breath  . Thoracic back pain  . Burning chest pain  . Back pain, thoracic   Past Medical History  Diagnosis Date  . GERD (gastroesophageal reflux disease)   . Osteoporosis   . 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 Wrist fracture, right 2008  . Diverticulitis 2009  . Gallstones 2009  . Hyperlipidemia   . Degenerative disc disease, lumbar     some spinal stenosis  . Shingles   . Baker's cyst   . Recurrent UTI   . Cataract    Past Surgical History  Procedure Date  . Appendectomy   . Partial hysterectomy   . Cholecystectomy 2012   . 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 Trigger finger release     bil hands  . Breast surgery     cyst removed left breast  . Cataracts surg     bil   History  Substance Use Topics  . Smoking  status: Never Smoker   . Smokeless tobacco: Never Used  . Alcohol Use: No   Family History  Problem Relation Age of Onset  . Heart disease Father     CAD  . Heart disease Brother     CAD  . Heart disease Brother     CAD  . Diverticulitis Sister    Allergies  Allergen Reactions  . Alendronate Sodium Swelling    REACTION: GI  . Penicillins     REACTION: rash  . Sulfonamide Derivatives     REACTION: rash   Current Outpatient Prescriptions on File Prior to Visit  Medication Sig Dispense Refill  . aspirin 81 MG tablet Take 81 mg by mouth daily.       Marland Kitchen azelastine (OPTIVAR) 0.05 % ophthalmic solution Place 1 drop into both eyes daily.       . Cholecalciferol (VITAMIN D) 2000 UNITS CAPS Take 1 capsule by mouth daily.        Marland Kitchen ibuprofen (ADVIL,MOTRIN) 200 MG tablet OTC as directed.       . simvastatin (ZOCOR) 80 MG tablet Take 0.5 tablets (40 mg total) by mouth every evening.  45 tablet  1  . alum & mag hydroxide-simeth (MAALOX PLUS) 400-400-40 MG/5ML suspension Take by mouth every 6 (six) hours as needed.        . naproxen sodium (ALEVE) 220 MG tablet Take 220 mg by mouth 2 (two) times daily with a meal.        . ondansetron (ZOFRAN) 8 MG tablet Take by mouth as needed.        . ranitidine (ZANTAC) 75 MG tablet Take 75 mg by mouth 2 (two) times daily.             Review of Systems Review of Systems  Constitutional: Negative for fever, appetite change, fatigue and unexpected weight change.  Eyes: Negative for pain and visual disturbance.  Respiratory: Negative for cough and shortness of breath.   Cardiovascular: Negative for palpitations   pos for chest wall (skin) pain, no edema  Gastrointestinal: Negative for nausea, diarrhea and constipation.  Genitourinary: Negative for urgency and frequency. pos for urine odor, neg for hematuria  Skin: Negative for pallor or rash   Neurological: Negative for weakness, light-headedness, numbness and headaches.  Hematological: Negative for  adenopathy. Does not bruise/bleed easily.  Psychiatric/Behavioral: Negative for dysphoric mood. The patient is not nervous/anxious.          Objective:   Physical Exam  Constitutional: She appears well-developed and well-nourished. No distress.  HENT:  Head: Normocephalic and atraumatic.  Eyes: Conjunctivae and EOM are normal. Pupils are equal, round, and reactive to light.  Neck: Normal range of motion. Neck supple. No JVD present. Carotid bruit is not present. No thyromegaly present.  Cardiovascular: Normal rate, regular rhythm, normal heart sounds and intact distal pulses.   Pulmonary/Chest: Effort normal and breath sounds normal. No respiratory distress. She has no rales. She exhibits tenderness.  Abdominal: Soft. Bowel sounds are normal. She exhibits no distension and no mass. There is tenderness. There is no rebound and no guarding.       Still tender diffusely over upper abdomen/ flanks/ chest wall and middle thoracic region  No suprapubic tenderness    Musculoskeletal: She exhibits tenderness. She exhibits no edema.       No cva tenderness   Lymphadenopathy:    She has no cervical adenopathy.  Neurological: She is alert. She has normal reflexes. No cranial nerve deficit. Coordination normal.  Skin: Skin is warm and dry. No rash noted. No erythema. No pallor.  Psychiatric: She has a normal mood and affect.       Mood is better/ overall feeling much better           Assessment & Plan:

## 2011-01-05 NOTE — Patient Instructions (Addendum)
We will continue the amitriptyline and the percocet as long as you need it  Continue walking with your walker I'm glad you are feeling better  Take cipro for your urinary tract infection - I sent px to your pharmacy Drink lots of water  I will update you when urine culture returns If you are doing ok - I will see you back in about 3 months

## 2011-04-07 ENCOUNTER — Encounter: Payer: Self-pay | Admitting: Family Medicine

## 2011-04-07 ENCOUNTER — Ambulatory Visit (INDEPENDENT_AMBULATORY_CARE_PROVIDER_SITE_OTHER): Payer: Medicare Other | Admitting: Family Medicine

## 2011-04-07 VITALS — BP 108/56 | HR 92 | Temp 97.9°F | Ht 62.0 in | Wt 126.2 lb

## 2011-04-07 DIAGNOSIS — M546 Pain in thoracic spine: Secondary | ICD-10-CM

## 2011-04-07 DIAGNOSIS — I10 Essential (primary) hypertension: Secondary | ICD-10-CM

## 2011-04-07 NOTE — Patient Instructions (Signed)
I'm glad you are doing better Continue current medicines  Follow up in mid summer for an annual exam with labs prior

## 2011-04-07 NOTE — Assessment & Plan Note (Signed)
Much improved with elavil and time - from reactivation of shingles virus  Pt will continue the elavil as it helps sleep and her chronic lumbar/ leg pain also  Uses percocet very rarely  Will continue current medicines and follow up this summer

## 2011-04-07 NOTE — Assessment & Plan Note (Signed)
bp in fair control at this time  No changes needed  Disc lifstyle change with low sodium diet and exercise   Will f/u for annual exam this summer

## 2011-04-07 NOTE — Progress Notes (Signed)
Subjective:    Patient ID: Chloe Cooper, female    DOB: Dec 10, 1916, 78 y.o.   MRN: [id number]  HPI Here for f/u of back and chest pain from zoster as well as HTN Feeling better and better   bp is  108/56   Today No cp or palpitations or headaches or edema  No side effects to medicines    Last visit pain was much more tolerable with the 20 mg of elavil at bedtime Pain generally improved- it comes and goes -- once in a while there will be burning in chest  Otherwise is more in her legs from ongoing back problem  Is also helping her sleep at night - gets a good night rest   Nothing new going on   Is eating healthy diet - has an appetite now   Wt is up 3 lb with bmi of 23  Lab Results  Component Value Date   CHOL 186 07/22/2010   HDL 39.90 07/22/2010   LDLCALC 101* 04/03/2009   LDLDIRECT 122.0 07/22/2010   TRIG 291.0* 07/22/2010   CHOLHDL 5 07/22/2010    Patient Active Problem List  Diagnoses  . UNSPECIFIED VITAMIN D DEFICIENCY  . HYPERCHOLESTEROLEMIA  . ESSENTIAL HYPERTENSION, BENIGN  . OSTEOARTHRITIS  . OSTEOPOROSIS  . Dizziness  . Leg cramps  . Osteoarthritis  . Shortness of breath  . Back pain, thoracic   Past Medical History  Diagnosis Date  . GERD (gastroesophageal reflux disease)   . Osteoporosis   . 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 Wrist fracture, right 2008  . Diverticulitis 2009  . Gallstones 2009  . Hyperlipidemia   . Degenerative disc disease, lumbar     some spinal stenosis  . Shingles   . Baker's cyst   . Recurrent UTI   . Cataract    Past Surgical History  Procedure Date  . Appendectomy   . Partial hysterectomy   . Cholecystectomy 2012   . 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 Trigger finger release     bil hands  . Breast surgery     cyst removed left breast  . Cataracts surg     bil   History  Substance Use Topics  . Smoking status: Never Smoker   . Smokeless tobacco: Never Used  . Alcohol Use:  No   Family History  Problem Relation Age of Onset  . Heart disease Father     CAD  . Heart disease Brother     CAD  . Heart disease Brother     CAD  . Diverticulitis Sister    Allergies  Allergen Reactions  . Alendronate Sodium Swelling    REACTION: GI  . Penicillins     REACTION: rash  . Sulfonamide Derivatives     REACTION: rash   Current Outpatient Prescriptions on File Prior to Visit  Medication Sig Dispense Refill  . amitriptyline (ELAVIL) 10 MG tablet Take 2 pills by mouth each bedtime  60 tablet  11  . aspirin 81 MG tablet Take 81 mg by mouth daily.       Marland Kitchen azelastine (OPTIVAR) 0.05 % ophthalmic solution Place 1 drop into both eyes daily.       . Cholecalciferol (VITAMIN D) 2000 UNITS CAPS Take 1 capsule by mouth daily.        Marland Kitchen oxyCODONE-acetaminophen (PERCOCET) 7.5-500 MG per tablet Take 1 tablet by mouth daily as needed for pain.  30 tablet  0  . simvastatin (ZOCOR) 80 MG tablet Take 0.5 tablets (40 mg total) by mouth every evening.  45 tablet  1  . alum & mag hydroxide-simeth (MAALOX PLUS) 400-400-40 MG/5ML suspension Take by mouth every 6 (six) hours as needed.        Marland Kitchen ibuprofen (ADVIL,MOTRIN) 200 MG tablet OTC as directed.       . naproxen sodium (ALEVE) 220 MG tablet Take 220 mg by mouth 2 (two) times daily with a meal.        . ondansetron (ZOFRAN) 8 MG tablet Take by mouth as needed.        . ranitidine (ZANTAC) 75 MG tablet Take 75 mg by mouth 2 (two) times daily.           Review of Systems Review of Systems  Constitutional: Negative for fever, appetite change, fatigue and unexpected weight change.  Eyes: Negative for pain and visual disturbance.  Respiratory: Negative for cough and shortness of breath.   Cardiovascular: Negative for cp or palpitations    Gastrointestinal: Negative for nausea, diarrhea and constipation.  Genitourinary: Negative for urgency and frequency.  Skin: Negative for pallor or rash   Msk pos for chronic lumbar and leg pain,  improvement of upper back and chest pain  Neurological: Negative for weakness, light-headedness, numbness and headaches.  Hematological: Negative for adenopathy. Does not bruise/bleed easily.  Psychiatric/Behavioral: Negative for dysphoric mood. The patient is not nervous/anxious.          Objective:   Physical Exam  Constitutional: She appears well-developed and well-nourished. No distress.       Well appearing elderly female  HENT:  Head: Normocephalic and atraumatic.  Mouth/Throat: Oropharynx is clear and moist.  Eyes: Conjunctivae and EOM are normal. Pupils are equal, round, and reactive to light. No scleral icterus.  Neck: Normal range of motion. Neck supple. No JVD present. No thyromegaly present.  Cardiovascular: Normal rate, regular rhythm and normal heart sounds.   Pulmonary/Chest: Effort normal and breath sounds normal. No respiratory distress. She has no wheezes. She exhibits no tenderness.  Abdominal: Soft. Bowel sounds are normal. She exhibits no distension and no mass. There is no tenderness.  Musculoskeletal: She exhibits no edema and no tenderness.       No cva tenderness  Lymphadenopathy:    She has no cervical adenopathy.  Neurological: She is alert. She exhibits normal muscle tone. Coordination normal.  Skin: Skin is warm and dry. No rash noted. No erythema. No pallor.  Psychiatric: She has a normal mood and affect.          Assessment & Plan:

## 2011-05-25 ENCOUNTER — Ambulatory Visit (INDEPENDENT_AMBULATORY_CARE_PROVIDER_SITE_OTHER): Payer: Medicare Other | Admitting: Family Medicine

## 2011-05-25 ENCOUNTER — Encounter: Payer: Self-pay | Admitting: Family Medicine

## 2011-05-25 VITALS — BP 116/70 | HR 80 | Temp 98.4°F | Wt 128.2 lb

## 2011-05-25 DIAGNOSIS — M545 Low back pain: Secondary | ICD-10-CM

## 2011-05-25 DIAGNOSIS — N390 Urinary tract infection, site not specified: Secondary | ICD-10-CM

## 2011-05-25 DIAGNOSIS — R8290 Unspecified abnormal findings in urine: Secondary | ICD-10-CM

## 2011-05-25 DIAGNOSIS — R82998 Other abnormal findings in urine: Secondary | ICD-10-CM

## 2011-05-25 LAB — POCT URINALYSIS DIPSTICK
Bilirubin, UA: NEGATIVE
Glucose, UA: NEGATIVE
Ketones, UA: NEGATIVE
Nitrite, UA: POSITIVE
Spec Grav, UA: 1.01
pH, UA: 6

## 2011-05-25 MED ORDER — CIPROFLOXACIN HCL 250 MG PO TABS: 250.0000 mg | ORAL_TABLET | Freq: Two times a day (BID) | ORAL | Status: AC

## 2011-05-25 NOTE — Assessment & Plan Note (Signed)
UA with 3+ LE and large nitrites and trace blood Micro consistent with UTI as well as story. Treat with cipro 250mg  bid x5 days. Update [place] if not improving as expected. Sent culture given h/o recurrent UTIs.

## 2011-05-25 NOTE — Patient Instructions (Signed)
You have another urinary infection - treat with cipro twice daily for 5 days. Push fluids and rest. Update [place] if fever >101 or worsening instead of improving. I hope you start feeling better.  Urinary Tract Infection Infections of the urinary tract can start in several places. A bladder infection (cystitis), a kidney infection (pyelonephritis), and a prostate infection (prostatitis) are different types of urinary tract infections (UTIs). They usually get better if treated with medicines (antibiotics) that kill germs. Take all the medicine until it is gone. You or your child may feel better in a few days, but TAKE ALL MEDICINE or the infection may not respond and may become more difficult to treat. HOME CARE INSTRUCTIONS   Drink enough water and fluids to keep the urine clear or pale yellow. Cranberry juice is especially recommended, in addition to large amounts of water.   Avoid caffeine, tea, and carbonated beverages. They tend to irritate the bladder.   Alcohol may irritate the prostate.   Only take over-the-counter or prescription medicines for pain, discomfort, or fever as directed by your caregiver.  To prevent further infections:  Empty the bladder often. Avoid holding urine for long periods of time.   After a bowel movement, women should cleanse from front to back. Use each tissue only once.   Empty the bladder before and after sexual intercourse.  FINDING OUT THE RESULTS OF YOUR TEST Not all test results are available during your visit. If your or your child's test results are not back during the visit, make an appointment with your caregiver to find out the results. Do not assume everything is normal if you have not heard from your caregiver or the medical facility. It is important for you to follow up on all test results. SEEK MEDICAL CARE IF:   There is back pain.   Your baby is older than 3 months with a rectal temperature of 100.5 F (38.1 C) or higher for more than 1 day.     Your or your child's problems (symptoms) are no better in 3 days. Return sooner if you or your child is getting worse.  SEEK IMMEDIATE MEDICAL CARE IF:   There is severe back pain or lower abdominal pain.   You or your child develops chills.   You have a fever.   Your baby is older than 3 months with a rectal temperature of 102 F (38.9 C) or higher.   Your baby is 4 months old or younger with a rectal temperature of 100.4 F (38 C) or higher.   There is nausea or vomiting.   There is continued burning or discomfort with urination.  MAKE SURE YOU:   Understand these instructions.   Will watch your condition.   Will get help right away if you are not doing well or get worse.  Document Released: 12/02/2004 Document Revised: 02/11/2011 Document Reviewed: 07/07/2006 [facility] Patient Information 2012 [place], [place].

## 2011-05-25 NOTE — Progress Notes (Signed)
  Subjective:    Patient ID: Chloe Cooper, female    DOB: 12/24/1916, 78 y.o.   MRN: [id number]  HPI CC: UTI?  Seen here 08/2010 with UTI - >100k E coli Seen again 12/2010 with UTI - >100k E coli Both times sensitive to cipro.  H/o recurrent UTIs, previously saw Dr. Aldean Ast who treated her with nitrofurantoin 50mg  bid for several months.    1d h/o milky looking urine and foul smell.  Also with incomplete voiding, urgency and frequency.  + worse LBP.  No dysuria, fevers/chills, n/v, abd pain.  Has been drinking cranberry juice and water.  H/o shingles bilateral lower abd, some burning from this.  Review of Systems Per HPI    Objective:   Physical Exam  Nursing note and vitals reviewed. Constitutional: She appears well-developed and well-nourished. No distress.  HENT:  Head: Normocephalic and atraumatic.  Mouth/Throat: Oropharynx is clear and moist. No oropharyngeal exudate.  Eyes: Conjunctivae and EOM are normal. Pupils are equal, round, and reactive to light. No scleral icterus.  Cardiovascular: Normal rate, regular rhythm, normal heart sounds and intact distal pulses.   No murmur heard. Pulmonary/Chest: Effort normal and breath sounds normal. No respiratory distress. She has no wheezes. She has no rales.  Abdominal: Soft. Bowel sounds are normal. She exhibits no distension. There is no tenderness. There is no rebound, no guarding and no CVA tenderness.  Musculoskeletal: She exhibits no edema.  Skin: Skin is warm and dry. No rash noted.  Psychiatric: She has a normal mood and affect.      Assessment & Plan:

## 2011-05-28 LAB — URINE CULTURE: Colony Count: >=100000

## 2011-06-15 ENCOUNTER — Other Ambulatory Visit: Payer: Self-pay | Admitting: Family Medicine

## 2011-09-02 ENCOUNTER — Encounter: Payer: Self-pay | Admitting: Family Medicine

## 2011-09-02 ENCOUNTER — Ambulatory Visit (INDEPENDENT_AMBULATORY_CARE_PROVIDER_SITE_OTHER): Payer: Medicare Other | Admitting: Family Medicine

## 2011-09-02 VITALS — BP 147/66 | HR 66 | Temp 98.5°F | Wt 136.0 lb

## 2011-09-02 DIAGNOSIS — N390 Urinary tract infection, site not specified: Secondary | ICD-10-CM

## 2011-09-02 LAB — POCT URINALYSIS DIPSTICK
Bilirubin, UA: NEGATIVE
Blood, UA: NEGATIVE
Glucose, UA: NEGATIVE
Nitrite, UA: NEGATIVE
Urobilinogen, UA: 0.2

## 2011-09-02 MED ORDER — CIPROFLOXACIN HCL 250 MG PO TABS: 250.0000 mg | ORAL_TABLET | Freq: Two times a day (BID) | ORAL | Status: AC

## 2011-09-02 NOTE — Patient Instructions (Signed)
You have another UTI.  Treat with cipro twice daily for 5 days. If happening more frequently, we may want to place you on daily antibiotic for prevention. Sent culture to ensure on correct antibiotic. Push fluids and rest.

## 2011-09-02 NOTE — Progress Notes (Addendum)
  Subjective:    Patient ID: Chloe Cooper, female    DOB: 28-May-1916, 78 y.o.   MRN: [id number]  HPI CC: recurrent UTI  Seen here 08/2010 with UTI - >100k E coli  Seen again 12/2010 with UTI - >100k E coli  Seen again 05/25/2011 with UTI - >100k pansensitive E coli All times sensitive to cipro.  "same problem as always"  Previously on macrobid ppx per Dr. Serena Colonel.  2d ago started with lower abd pain, milky and flaky urine noted.  Frequency.  + back pain.    No dysuria, urgency, fevers/chills, n/v.  No hematuria.  Past Medical History  Diagnosis Date  . GERD (gastroesophageal reflux disease)   . Osteoporosis   . 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 Wrist fracture, right 2008  . Diverticulitis 2009  . Gallstones 2009  . Hyperlipidemia   . Degenerative disc disease, lumbar     some spinal stenosis  . Shingles   . Baker's cyst   . Recurrent UTI   . Cataract      Review of Systems Per HPI    Objective:   Physical Exam  Nursing note and vitals reviewed. Constitutional: She appears well-developed and well-nourished. No distress.  Abdominal: Soft. Bowel sounds are normal. She exhibits no distension and no mass. There is no hepatosplenomegaly. There is tenderness (mild soreness) in the suprapubic area. There is no rebound, no guarding and no CVA tenderness.       Assessment & Plan:

## 2011-09-02 NOTE — Assessment & Plan Note (Addendum)
UA/micro suspicious for re infection. Treat with cipro 250mg  bid x 5 days. Update if not improved. Last UTI was 3 mo ago. If continued, consider daily ppx abx dosing.  Prior on nitrofurantoin. Sent culture given h/o recurrent UTIs. Lab Results  Component Value Date   CREATININE 0.72 10/30/2010

## 2011-09-05 LAB — URINE CULTURE: Colony Count: >=100000

## 2011-09-06 ENCOUNTER — Other Ambulatory Visit: Payer: Self-pay | Admitting: Family Medicine

## 2011-09-06 MED ORDER — CEPHALEXIN 500 MG PO CAPS: 500.0000 mg | ORAL_CAPSULE | Freq: Two times a day (BID) | ORAL | Status: AC

## 2011-10-04 ENCOUNTER — Telehealth: Payer: Self-pay | Admitting: Family Medicine

## 2011-10-04 DIAGNOSIS — E559 Vitamin D deficiency, unspecified: Secondary | ICD-10-CM

## 2011-10-04 DIAGNOSIS — M810 Age-related osteoporosis without current pathological fracture: Secondary | ICD-10-CM

## 2011-10-04 DIAGNOSIS — I10 Essential (primary) hypertension: Secondary | ICD-10-CM

## 2011-10-04 DIAGNOSIS — E7800 Pure hypercholesterolemia, unspecified: Secondary | ICD-10-CM

## 2011-10-04 NOTE — Telephone Encounter (Signed)
Message copied by Judy Pimple on Mon Oct 04, 2011  9:09 PM ------      Message from: Alvina Chou      Created: Wed Sep 29, 2011  4:15 PM      Regarding: Labs for Tuesday, 7.30.13       Patient is scheduled for CPX labs, please order future labs, Thanks , Camelia Eng

## 2011-10-05 ENCOUNTER — Other Ambulatory Visit (INDEPENDENT_AMBULATORY_CARE_PROVIDER_SITE_OTHER): Payer: Medicare Other

## 2011-10-05 DIAGNOSIS — E7800 Pure hypercholesterolemia, unspecified: Secondary | ICD-10-CM

## 2011-10-05 DIAGNOSIS — I10 Essential (primary) hypertension: Secondary | ICD-10-CM

## 2011-10-05 DIAGNOSIS — M810 Age-related osteoporosis without current pathological fracture: Secondary | ICD-10-CM

## 2011-10-05 DIAGNOSIS — E559 Vitamin D deficiency, unspecified: Secondary | ICD-10-CM

## 2011-10-05 LAB — CBC WITH DIFFERENTIAL/PLATELET
Basophils Absolute: 0 10*3/uL (ref 0.0–0.1)
Eosinophils Absolute: 0.1 10*3/uL (ref 0.0–0.7)
HCT: 36.8 % (ref 36.0–46.0)
Hemoglobin: 12.1 g/dL (ref 12.0–15.0)
Lymphs Abs: 1.7 10*3/uL (ref 0.7–4.0)
MCHC: 32.9 g/dL (ref 30.0–36.0)
MCV: 94.5 fl (ref 78.0–100.0)
Monocytes Absolute: 0.5 10*3/uL (ref 0.1–1.0)
Monocytes Relative: 7.1 % (ref 3.0–12.0)
Neutro Abs: 4.5 10*3/uL (ref 1.4–7.7)
RDW: 12.5 % (ref 11.5–14.6)

## 2011-10-05 LAB — COMPREHENSIVE METABOLIC PANEL
ALT: 12 U/L (ref 0–35)
CO2: 28 mEq/L (ref 19–32)
Creatinine, Ser: 0.7 mg/dL (ref 0.4–1.2)
GFR: 83 mL/min (ref >60.00)
Total Bilirubin: 0.8 mg/dL (ref 0.3–1.2)

## 2011-10-05 LAB — LIPID PANEL
HDL: 46.1 mg/dL (ref >39.00)
VLDL: 37.6 mg/dL (ref 0.0–40.0)

## 2011-10-06 LAB — VITAMIN D 25 HYDROXY (VIT D DEFICIENCY, FRACTURES): Vit D, 25-Hydroxy: 57 ng/mL (ref 30–89)

## 2011-10-13 ENCOUNTER — Ambulatory Visit (INDEPENDENT_AMBULATORY_CARE_PROVIDER_SITE_OTHER): Payer: Medicare Other | Admitting: Family Medicine

## 2011-10-13 ENCOUNTER — Encounter: Payer: Self-pay | Admitting: Family Medicine

## 2011-10-13 VITALS — BP 122/68 | HR 71 | Temp 98.2°F | Ht 62.0 in | Wt 137.8 lb

## 2011-10-13 DIAGNOSIS — I10 Essential (primary) hypertension: Secondary | ICD-10-CM

## 2011-10-13 DIAGNOSIS — E7800 Pure hypercholesterolemia, unspecified: Secondary | ICD-10-CM

## 2011-10-13 DIAGNOSIS — Z23 Encounter for immunization: Secondary | ICD-10-CM

## 2011-10-13 DIAGNOSIS — E559 Vitamin D deficiency, unspecified: Secondary | ICD-10-CM

## 2011-10-13 NOTE — Patient Instructions (Signed)
I'm glad you are doing so well  Blood pressure and labs are great  No change in medicines  Pneumonia vaccine today I want you to get a flu shot in the fall - October is a good month for that  Follow up in 6 months

## 2011-10-13 NOTE — Progress Notes (Signed)
Subjective:    Patient ID: Chloe Cooper, female    DOB: Jul 29, 1916, 78 y.o.   MRN: [id number]  HPI Here for check up of chronic medical conditions and to review health mt list  Had a birthday yesterday - went out to dinner for her birthday with her husband   Except for arthritis pain - is doing very well  Very mentally sharp  Feeling good   All labs look good    bp is  ok   Today BP Readings from Last 3 Encounters:  10/13/11 122/68  09/02/11 147/66  05/25/11 116/70    No cp or palpitations or headaches or edema  No side effects to medicines    Lipids - on zocor 40 Lab Results  Component Value Date   CHOL 174 10/05/2011   CHOL 186 07/22/2010   CHOL 162 10/14/2009   Lab Results  Component Value Date   HDL 46.10 10/05/2011   HDL 78.29 07/22/2010   HDL 56.21* 10/14/2009   Lab Results  Component Value Date   LDLCALC 90 10/05/2011   LDLCALC 101* 04/03/2009   LDLCALC 77 10/02/2008   Lab Results  Component Value Date   TRIG 188.0* 10/05/2011   TRIG 291.0* 07/22/2010   TRIG 328.0* 10/14/2009   Lab Results  Component Value Date   CHOLHDL 4 10/05/2011   CHOLHDL 5 07/22/2010   CHOLHDL 4 10/14/2009   Lab Results  Component Value Date   LDLDIRECT 122.0 07/22/2010   LDLDIRECT 83.9 10/14/2009   LDLDIRECT 101.2 04/23/2008    Cholesterol is improved  Eats a healthy diet - lot of vegetables Not much red meat  occ MOM for constipation, and stool softeners  No abd pain   Last colonosc 10 years ago- ok- does not want another  hyst in past -- no gyn problems   mammo-- does not want them any more at her age  She does self exams     Zoster status- has had shingles twice Does not want the vaccine   imms - does not generally get flu shot   Hx of OP Intol fosamax Does not want to have any more tx or dexas Vit D level good Takes her ca and D Very careful of falls   Patient Active Problem List  Diagnosis  . UNSPECIFIED VITAMIN D DEFICIENCY  . HYPERCHOLESTEROLEMIA  .  ESSENTIAL HYPERTENSION, BENIGN  . OSTEOPOROSIS  . Dizziness  . Leg cramps  . Osteoarthritis  . Shortness of breath  . Back pain, thoracic  . Recurrent UTI   Past Medical History  Diagnosis Date  . GERD (gastroesophageal reflux disease)   . Osteoporosis   . 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 Wrist fracture, right 2008  . Diverticulitis 2009  . Gallstones 2009  . Hyperlipidemia   . Degenerative disc disease, lumbar     some spinal stenosis  . Shingles   . Baker's cyst   . Recurrent UTI   . Cataract    Past Surgical History  Procedure Date  . Appendectomy   . Partial hysterectomy   . Cholecystectomy 2012   . 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 Trigger finger release     bil hands  . Breast surgery     cyst removed left breast  . Cataracts surg     bil   History  Substance Use Topics  . Smoking status: Never Smoker   . Smokeless tobacco:  Never Used  . Alcohol Use: No   Family History  Problem Relation Age of Onset  . Heart disease Father     CAD  . Heart disease Brother     CAD  . Heart disease Brother     CAD  . Diverticulitis Sister    Allergies  Allergen Reactions  . Alendronate Sodium Swelling    REACTION: GI  . Penicillins     REACTION: rash  . Sulfonamide Derivatives     REACTION: rash   Current Outpatient Prescriptions on File Prior to Visit  Medication Sig Dispense Refill  . alum & mag hydroxide-simeth (MAALOX PLUS) 400-400-40 MG/5ML suspension Take by mouth every 6 (six) hours as needed.        Marland Kitchen amitriptyline (ELAVIL) 10 MG tablet Take 2 pills by mouth each bedtime  60 tablet  11  . aspirin 81 MG tablet Take 81 mg by mouth daily.       Marland Kitchen azelastine (OPTIVAR) 0.05 % ophthalmic solution Place 1 drop into both eyes daily.       . Cholecalciferol (VITAMIN D) 2000 UNITS CAPS Take 1 capsule by mouth daily.        Marland Kitchen OVER THE COUNTER MEDICATION [facility] Pain relief 500 mg. As needed      . simvastatin (ZOCOR) 80 MG  tablet TAKE ONE-HALF TABLET BY MOUTH EVERY DAY IN THE EVENING  45 tablet  3  . DISCONTD: ranitidine (ZANTAC) 75 MG tablet Take 75 mg by mouth 2 (two) times daily.            Review of Systems Review of Systems  Constitutional: Negative for fever, appetite change, fatigue and unexpected weight change.  Eyes: Negative for pain and visual disturbance.  Respiratory: Negative for cough and shortness of breath.   Cardiovascular: Negative for cp or palpitations    Gastrointestinal: Negative for nausea, diarrhea and constipation.  Genitourinary: Negative for urgency and frequency.  Skin: Negative for pallor or rash   MSK pos for aches and pains from arthritis  Neurological: Negative for weakness, light-headedness, numbness and headaches.  Hematological: Negative for adenopathy. Does not bruise/bleed easily.  Psychiatric/Behavioral: Negative for dysphoric mood. The patient is not nervous/anxious.         Objective:   Physical Exam  Constitutional: She appears well-developed and well-nourished. No distress.       Frail appearing elderly female  HENT:  Head: Normocephalic and atraumatic.  Mouth/Throat: Oropharynx is clear and moist.  Eyes: Conjunctivae and EOM are normal. Pupils are equal, round, and reactive to light. No scleral icterus.  Neck: Normal range of motion. Neck supple. No JVD present. Carotid bruit is not present. No thyromegaly present.  Cardiovascular: Normal rate, regular rhythm, normal heart sounds and intact distal pulses.  Exam reveals no gallop.   Pulmonary/Chest: Effort normal and breath sounds normal. No respiratory distress.  Abdominal: Soft. Bowel sounds are normal. She exhibits no distension, no abdominal bruit and no mass. There is no tenderness.  Musculoskeletal: She exhibits no edema.       Changes or OA noted in peripheral joints   Lymphadenopathy:    She has no cervical adenopathy.  Neurological: She is alert. She has normal reflexes. No cranial nerve deficit.  She exhibits normal muscle tone. Coordination normal.  Skin: Skin is warm and dry. No rash noted. No erythema. No pallor.  Psychiatric: She has a normal mood and affect.       Pt is mentally sharp  Assessment & Plan:

## 2011-10-17 NOTE — Assessment & Plan Note (Signed)
On zocor and diet  Pt tolerates higher doses  Of zocor and has been on long term Disc goals for lipids and reasons to control them Rev labs with pt Rev low sat fat diet in detail

## 2011-10-17 NOTE — Assessment & Plan Note (Signed)
bp in fair control at this time  No changes needed - this is controlled without med at this time Enc continued healthy lifestyle  Disc lifstyle change with low sodium diet and exercise

## 2011-10-17 NOTE — Assessment & Plan Note (Signed)
Level is ok with current supplementation  Disc imp to overall and bone health

## 2011-12-06 ENCOUNTER — Other Ambulatory Visit: Payer: Self-pay | Admitting: Dermatology

## 2012-02-15 ENCOUNTER — Encounter: Payer: Self-pay | Admitting: Family Medicine

## 2012-02-15 ENCOUNTER — Ambulatory Visit (INDEPENDENT_AMBULATORY_CARE_PROVIDER_SITE_OTHER): Payer: Medicare Other | Admitting: Family Medicine

## 2012-02-15 VITALS — BP 138/74 | HR 82 | Temp 98.3°F | Ht 62.0 in | Wt 146.8 lb

## 2012-02-15 DIAGNOSIS — R350 Frequency of micturition: Secondary | ICD-10-CM

## 2012-02-15 DIAGNOSIS — N390 Urinary tract infection, site not specified: Secondary | ICD-10-CM | POA: Insufficient documentation

## 2012-02-15 LAB — POCT URINALYSIS DIPSTICK
Glucose, UA: NEGATIVE
Nitrite, UA: POSITIVE
Urobilinogen, UA: 0.2
pH, UA: 6

## 2012-02-15 LAB — POCT UA - MICROSCOPIC ONLY
Casts, Ur, LPF, POC: 0
Crystals, Ur, HPF, POC: 0

## 2012-02-15 MED ORDER — CEPHALEXIN 250 MG PO CAPS: 250.0000 mg | ORAL_CAPSULE | Freq: Two times a day (BID) | ORAL | Status: DC

## 2012-02-15 NOTE — Patient Instructions (Addendum)
Drink lots of fluids Update if not starting to improve in several days or if worsening  We will call you back with a urine culture result when it returns

## 2012-02-15 NOTE — Progress Notes (Signed)
Subjective:    Patient ID: Chloe Cooper, female    DOB: Jun 13, 1916, 78 y.o.   MRN: [id number]  HPI Here with likely uti  Sat am started having frequency and bad odor to urine No burning or blood in urine  No new back pain  Bladder pain is significant    Patient Active Problem List  Diagnosis  . UNSPECIFIED VITAMIN D DEFICIENCY  . HYPERCHOLESTEROLEMIA  . ESSENTIAL HYPERTENSION, BENIGN  . OSTEOPOROSIS  . Leg cramps  . Osteoarthritis  . Back pain, thoracic  . Recurrent UTI   Past Medical History  Diagnosis Date  . GERD (gastroesophageal reflux disease)   . Osteoporosis   . 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 Wrist fracture, right 2008  . Diverticulitis 2009  . Gallstones 2009  . Hyperlipidemia   . Degenerative disc disease, lumbar     some spinal stenosis  . Shingles   . Baker's cyst   . Recurrent UTI   . Cataract    Past Surgical History  Procedure Date  . Appendectomy   . Partial hysterectomy   . Cholecystectomy 2012   . 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 Trigger finger release     bil hands  . Breast surgery     cyst removed left breast  . Cataracts surg     bil   History  Substance Use Topics  . Smoking status: Never Smoker   . Smokeless tobacco: Never Used  . Alcohol Use: No   Family History  Problem Relation Age of Onset  . Heart disease Father     CAD  . Heart disease Brother     CAD  . Heart disease Brother     CAD  . Diverticulitis Sister    Allergies  Allergen Reactions  . Alendronate Sodium Swelling    REACTION: GI  . Penicillins     REACTION: rash  . Sulfonamide Derivatives     REACTION: rash   Current Outpatient Prescriptions on File Prior to Visit  Medication Sig Dispense Refill  . alum & mag hydroxide-simeth (MAALOX PLUS) 400-400-40 MG/5ML suspension Take by mouth every 6 (six) hours as needed.        Marland Kitchen aspirin 81 MG tablet Take 81 mg by mouth daily.       Marland Kitchen azelastine (OPTIVAR) 0.05 %  ophthalmic solution Place 1 drop into both eyes daily.       . Cholecalciferol (VITAMIN D) 2000 UNITS CAPS Take 1 capsule by mouth daily.        Marland Kitchen OVER THE COUNTER MEDICATION [facility] Pain relief 500 mg. As needed      . [DISCONTINUED] ranitidine (ZANTAC) 75 MG tablet Take 75 mg by mouth 2 (two) times daily.            Review of Systems Review of Systems  Constitutional: Negative for fever, appetite change, fatigue and unexpected weight change.  Eyes: Negative for pain and visual disturbance.  Respiratory: Negative for cough and shortness of breath.   Cardiovascular: Negative for cp or palpitations    Gastrointestinal: Negative for nausea, diarrhea and constipation.  Genitourinary: pos  for urgency and frequency. neg for n/v or fever or flank pain  Skin: Negative for pallor or rash   Neurological: Negative for weakness, light-headedness, numbness and headaches.  Hematological: Negative for adenopathy. Does not bruise/bleed easily.  Psychiatric/Behavioral: Negative for dysphoric mood. The patient is not nervous/anxious.  Objective:   Physical Exam  Constitutional: She appears well-developed and well-nourished. No distress.  HENT:  Head: Normocephalic and atraumatic.  Mouth/Throat: Oropharynx is clear and moist.  Neck: Normal range of motion. Neck supple.  Cardiovascular: Normal rate and regular rhythm.   Pulmonary/Chest: Effort normal and breath sounds normal.  Abdominal: Soft. Bowel sounds are normal. She exhibits no distension and no mass. There is no tenderness.       No suprapubic tenderness or fullness    Musculoskeletal:       No cva tenderness   Neurological: She is alert.  Skin: Skin is warm and dry. No rash noted. No erythema. No pallor.  Psychiatric: She has a normal mood and affect.          Assessment & Plan:

## 2012-02-15 NOTE — Assessment & Plan Note (Signed)
Acute with hx of recurrent utis  Pt's urologist Dr Aldean Ast is no longer there Rev last cx - tx with keflex Update if not starting to improve in several days or if worsening   Disc need for fluids  Pend cx

## 2012-02-17 LAB — URINE CULTURE: Colony Count: >=100000

## 2012-04-14 ENCOUNTER — Ambulatory Visit (INDEPENDENT_AMBULATORY_CARE_PROVIDER_SITE_OTHER): Payer: Medicare Other | Admitting: Family Medicine

## 2012-04-14 ENCOUNTER — Encounter: Payer: Self-pay | Admitting: Family Medicine

## 2012-04-14 VITALS — BP 124/78 | HR 75 | Temp 98.4°F | Ht 62.0 in | Wt 149.0 lb

## 2012-04-14 DIAGNOSIS — Z23 Encounter for immunization: Secondary | ICD-10-CM

## 2012-04-14 DIAGNOSIS — I10 Essential (primary) hypertension: Secondary | ICD-10-CM

## 2012-04-14 NOTE — Progress Notes (Signed)
Subjective:    Patient ID: Chloe Cooper, female    DOB: 1916-07-15, 79 y.o.   MRN: [id number]  HPI Here for f/u of chronic medical problems  Not getting out much with the very cold weather   79 year old overall doing well  Chronic back pain and OA - but still getting around well with a walker (uses a cane when she is with someone) Also has a scooter - to ride when she has a longer distance    bp is stable today  No cp or palpitations or headaches or edema  No side effects to medicines  BP Readings from Last 3 Encounters:  04/14/12 124/78  02/15/12 138/74  10/13/11 122/68     Fall hx --none  Fractures none   Mood - is pretty good overall  Frustrating - takes much longer to get everything done- still does all of her cooking and housework   Wt is up 3 lb with bmi of 27 Appetite is still quite good  Eats most anything she wants and tries to make sure she gets protein (likes fish)  Flu vaccine- did not get one  She as a rule does not as a rule get them   Td 04- will get today  Hx of hyperlipidemia  On statin and diet  Lab Results  Component Value Date   CHOL 174 10/05/2011   HDL 46.10 10/05/2011   LDLCALC 90 10/05/2011   LDLDIRECT 122.0 07/22/2010   TRIG 188.0* 10/05/2011   CHOLHDL 4 10/05/2011     Patient Active Problem List  Diagnosis  . UNSPECIFIED VITAMIN D DEFICIENCY  . HYPERCHOLESTEROLEMIA  . ESSENTIAL HYPERTENSION, BENIGN  . OSTEOPOROSIS  . Leg cramps  . Osteoarthritis  . Back pain, thoracic  . Recurrent UTI  . UTI (lower urinary tract infection)   Past Medical History  Diagnosis Date  . GERD (gastroesophageal reflux disease)   . Osteoporosis   . 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 Wrist fracture, right 2008  . Diverticulitis 2009  . Gallstones 2009  . Hyperlipidemia   . Degenerative disc disease, lumbar     some spinal stenosis  . Shingles   . Baker's cyst   . Recurrent UTI   . Cataract    Past Surgical History  Procedure  Date  . Appendectomy   . Partial hysterectomy   . Cholecystectomy 2012   . 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 Trigger finger release     bil hands  . Breast surgery     cyst removed left breast  . Cataracts surg     bil   History  Substance Use Topics  . Smoking status: Never Smoker   . Smokeless tobacco: Never Used  . Alcohol Use: No   Family History  Problem Relation Age of Onset  . Heart disease Father     CAD  . Heart disease Brother     CAD  . Heart disease Brother     CAD  . Diverticulitis Sister    Allergies  Allergen Reactions  . Alendronate Sodium Swelling    REACTION: GI  . Penicillins     REACTION: rash  . Sulfonamide Derivatives     REACTION: rash   Current Outpatient Prescriptions on File Prior to Visit  Medication Sig Dispense Refill  . alum & mag hydroxide-simeth (MAALOX PLUS) 400-400-40 MG/5ML suspension Take by mouth every 6 (six) hours as needed.        Marland Kitchen  aspirin 81 MG tablet Take 81 mg by mouth daily.       Marland Kitchen azelastine (OPTIVAR) 0.05 % ophthalmic solution Place 1 drop into both eyes daily.       . Cholecalciferol (VITAMIN D) 2000 UNITS CAPS Take 1 capsule by mouth daily.        Marland Kitchen OVER THE COUNTER MEDICATION [facility] Pain relief 500 mg. As needed      . [DISCONTINUED] ranitidine (ZANTAC) 75 MG tablet Take 75 mg by mouth 2 (two) times daily.          Review of Systems Review of Systems  Constitutional: Negative for fever, appetite change, fatigue and unexpected weight change.  Eyes: Negative for pain and visual disturbance.  Respiratory: Negative for cough and shortness of breath.   Cardiovascular: Negative for cp or palpitations    Gastrointestinal: Negative for nausea, diarrhea and constipation.  Genitourinary: Negative for urgency and frequency.  Skin: Negative for pallor or rash   Neurological: Negative for weakness, light-headedness, numbness and headaches.  Hematological: Negative for adenopathy. Does not bruise/bleed easily.   Psychiatric/Behavioral: Negative for dysphoric mood. The patient is not nervous/anxious.         Objective:   Physical Exam  Constitutional: She appears well-developed and well-nourished. No distress.  HENT:  Head: Normocephalic and atraumatic.  Right Ear: External ear normal.  Left Ear: External ear normal.  Nose: Nose normal.  Mouth/Throat: Oropharynx is clear and moist. No oropharyngeal exudate.  Eyes: Conjunctivae and EOM are normal. Pupils are equal, round, and reactive to light. Right eye exhibits no discharge. Left eye exhibits no discharge. No scleral icterus.  Neck: Normal range of motion. Neck supple. No JVD present. No thyromegaly present.  Cardiovascular: Normal rate, regular rhythm, normal heart sounds and intact distal pulses.  Exam reveals no gallop.   Pulmonary/Chest: Effort normal and breath sounds normal. No respiratory distress. She has no wheezes.  Abdominal: Soft. Bowel sounds are normal. She exhibits no distension. There is no tenderness.  Lymphadenopathy:    She has no cervical adenopathy.  Neurological: She is alert. She has normal reflexes. No cranial nerve deficit. She exhibits normal muscle tone. Coordination normal.  Skin: Skin is warm and dry. No rash noted. No erythema. No pallor.  Psychiatric: She has a normal mood and affect.          Assessment & Plan:

## 2012-04-14 NOTE — Assessment & Plan Note (Signed)
bp is stable today  No cp or palpitations or headaches or edema BP Readings from Last 3 Encounters:  04/14/12 124/78  02/15/12 138/74  10/13/11 122/68     Still independent and doing well - controls her bp with diet/ good health habits Has been higher in past updateTd today

## 2012-04-14 NOTE — Patient Instructions (Addendum)
Tetanus shot today  Blood pressure is stable and I'm glad you are doing well  Follow up with me in 6 months for annual exam with labs prior

## 2012-06-09 ENCOUNTER — Encounter: Payer: Self-pay | Admitting: Family Medicine

## 2012-06-09 ENCOUNTER — Telehealth: Payer: Self-pay

## 2012-06-09 ENCOUNTER — Ambulatory Visit (INDEPENDENT_AMBULATORY_CARE_PROVIDER_SITE_OTHER): Payer: Medicare Other | Admitting: Family Medicine

## 2012-06-09 VITALS — BP 120/70 | HR 80 | Temp 98.1°F | Ht 62.0 in | Wt 144.8 lb

## 2012-06-09 DIAGNOSIS — H8110 Benign paroxysmal vertigo, unspecified ear: Secondary | ICD-10-CM

## 2012-06-09 DIAGNOSIS — R51 Headache: Secondary | ICD-10-CM

## 2012-06-09 DIAGNOSIS — I10 Essential (primary) hypertension: Secondary | ICD-10-CM

## 2012-06-09 DIAGNOSIS — R519 Headache, unspecified: Secondary | ICD-10-CM | POA: Insufficient documentation

## 2012-06-09 MED ORDER — MECLIZINE HCL 25 MG PO TABS: 25.0000 mg | ORAL_TABLET | Freq: Three times a day (TID) | ORAL | Status: DC | PRN

## 2012-06-09 NOTE — Telephone Encounter (Signed)
Pt called back and med is acetaminophen. Explained to pt that is generic Tylenol. Pt wants to know if she can keep taking med.Please advise.

## 2012-06-09 NOTE — Telephone Encounter (Signed)
That seems odd- please have her spell out the antihistamine ingredient just so I am aware- thanks

## 2012-06-09 NOTE — Telephone Encounter (Signed)
Pt left v/m was seen today and pt to call back with ingredients of [facility] pain med; no aspirin or tylenol but does have antihistamine in it.

## 2012-06-09 NOTE — Progress Notes (Signed)
Subjective:    Patient ID: Chloe Cooper, female    DOB: Aug 05, 1916, 79 y.o.   MRN: [id number]  HPI Here with inc bp - started a headache and dizziness on Tuesday It started when she turned her head in bed - the room started to spin  Headache started around the same time also , she takes a [facility] brand pain reliver (what it is?)  This helps the headache a little  Headache starts in the back of her head and then radiates to the sides of it  Dizziness occurs when she changes positions No n/v   No cold lately  ? Allergies Her nose runs clear mucous all the time No pain or pressure in her face  No fever   Just started wearing hearing aides  bp was up once at home in 150s Down to nl now   Patient Active Problem List  Diagnosis  . UNSPECIFIED VITAMIN D DEFICIENCY  . HYPERCHOLESTEROLEMIA  . ESSENTIAL HYPERTENSION, BENIGN  . OSTEOPOROSIS  . Osteoarthritis  . Back pain, thoracic  . Recurrent UTI   Past Medical History  Diagnosis Date  . GERD (gastroesophageal reflux disease)   . Osteoporosis   . 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 Wrist fracture, right 2008  . Diverticulitis 2009  . Gallstones 2009  . Hyperlipidemia   . Degenerative disc disease, lumbar     some spinal stenosis  . Shingles   . Baker's cyst   . Recurrent UTI   . Cataract    Past Surgical History  Procedure Laterality Date  . Appendectomy    . Partial hysterectomy    . Cholecystectomy  2012   . 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 Trigger finger release      bil hands  . Breast surgery      cyst removed left breast  . Cataracts surg      bil   History  Substance Use Topics  . Smoking status: Never Smoker   . Smokeless tobacco: Never Used  . Alcohol Use: No   Family History  Problem Relation Age of Onset  . Heart disease Father     CAD  . Heart disease Brother     CAD  . Heart disease Brother     CAD  . Diverticulitis Sister    Allergies  Allergen  Reactions  . Alendronate Sodium Swelling    REACTION: GI  . Penicillins     REACTION: rash  . Sulfonamide Derivatives     REACTION: rash   Current Outpatient Prescriptions on File Prior to Visit  Medication Sig Dispense Refill  . alum & mag hydroxide-simeth (MAALOX PLUS) 400-400-40 MG/5ML suspension Take by mouth every 6 (six) hours as needed.        Marland Kitchen aspirin 81 MG tablet Take 81 mg by mouth daily.       . Cholecalciferol (VITAMIN D) 2000 UNITS CAPS Take 1 capsule by mouth daily.        Marland Kitchen OVER THE COUNTER MEDICATION [facility] Pain relief 500 mg. As needed      . [DISCONTINUED] ranitidine (ZANTAC) 75 MG tablet Take 75 mg by mouth 2 (two) times daily.         No current facility-administered medications on file prior to visit.      Review of Systems Review of Systems  Constitutional: Negative for fever, appetite change, fatigue and unexpected weight change.  Eyes: Negative  for pain and visual disturbance.  Respiratory: Negative for cough and shortness of breath.   Cardiovascular: Negative for cp or palpitations    Gastrointestinal: Negative for nausea, diarrhea and constipation.  Genitourinary: Negative for urgency and frequency.  Skin: Negative for pallor or rash   Neurological: Negative for weakness, numbness/ speech difficulty/ facial droop.  Hematological: Negative for adenopathy. Does not bruise/bleed easily.  Psychiatric/Behavioral: Negative for dysphoric mood. The patient is not nervous/anxious.         Objective:   Physical Exam  Constitutional: She is oriented to person, place, and time. She appears well-developed and well-nourished. No distress.  HENT:  Head: Normocephalic and atraumatic.  Right Ear: External ear normal.  Left Ear: External ear normal.  Nose: Nose normal.  Mouth/Throat: Oropharynx is clear and moist. No oropharyngeal exudate.  TMs clear   Eyes: Conjunctivae and EOM are normal. Pupils are equal, round, and reactive to light. Right eye exhibits no  discharge. Left eye exhibits no discharge. No scleral icterus.  2-3 beats or horizontal nystagmus bilaterally  Neck: Normal range of motion. Neck supple. No JVD present. Carotid bruit is not present. No thyromegaly present.  Cardiovascular: Normal rate, regular rhythm, normal heart sounds and intact distal pulses.  Exam reveals no gallop.   Pulmonary/Chest: Effort normal and breath sounds normal. No respiratory distress. She has no wheezes.  Abdominal: Soft. Bowel sounds are normal. She exhibits no distension, no abdominal bruit and no mass. There is no tenderness.  Musculoskeletal: She exhibits no edema and no tenderness.  Lymphadenopathy:    She has no cervical adenopathy.  Neurological: She is alert and oriented to person, place, and time. She has normal reflexes. She displays no atrophy. No cranial nerve deficit or sensory deficit. She exhibits normal muscle tone. Coordination and gait normal.  No focal cerebellar signs   Skin: Skin is warm and dry. No rash noted. No erythema. No pallor.  Psychiatric: She has a normal mood and affect.          Assessment & Plan:

## 2012-06-09 NOTE — Telephone Encounter (Signed)
Left voicemail requesting pt to call office 

## 2012-06-09 NOTE — Telephone Encounter (Signed)
Pt notified that she is okay to continue otc pain med

## 2012-06-09 NOTE — Assessment & Plan Note (Signed)
bp went up at home with systolic of 150 when she was feeling dizzy-now is back to normal  Will continue to monitor

## 2012-06-09 NOTE — Assessment & Plan Note (Signed)
Pt has baseline hx of migraine- and now having mild posterior headache that is mild but persistent when she experiences vertigo Will try tx with meclizine and obs (nl exam and no other symptoms) If persistent however- low threshold to image head / eval further given her age

## 2012-06-09 NOTE — Patient Instructions (Addendum)
For dizziness (I think you have vergito)- please try meclizine - and be careful of sedation from this  Let me know if headache and dizziness are not better by early next week  If worse or new symptoms at any time , call

## 2012-06-09 NOTE — Assessment & Plan Note (Signed)
Started earlier this week-now improved but not gone Fairly reassuring exam- her symptoms are positional  Given meclizine to try over the weekend -with caution (sedation/ falls-counseled on this)  Will update [place] early next week with how she is feeling

## 2012-06-09 NOTE — Telephone Encounter (Signed)
Chloe Cooper is fine- thanks for the clarification

## 2012-08-11 ENCOUNTER — Ambulatory Visit (INDEPENDENT_AMBULATORY_CARE_PROVIDER_SITE_OTHER): Payer: Medicare Other | Admitting: Family Medicine

## 2012-08-11 ENCOUNTER — Encounter: Payer: Self-pay | Admitting: Family Medicine

## 2012-08-11 VITALS — BP 128/76 | HR 88 | Temp 98.1°F | Ht 62.0 in | Wt 151.5 lb

## 2012-08-11 DIAGNOSIS — R609 Edema, unspecified: Secondary | ICD-10-CM

## 2012-08-11 DIAGNOSIS — H8110 Benign paroxysmal vertigo, unspecified ear: Secondary | ICD-10-CM

## 2012-08-11 DIAGNOSIS — I10 Essential (primary) hypertension: Secondary | ICD-10-CM

## 2012-08-11 DIAGNOSIS — R600 Localized edema: Secondary | ICD-10-CM

## 2012-08-11 LAB — COMPREHENSIVE METABOLIC PANEL
AST: 20 U/L (ref 0–37)
Alkaline Phosphatase: 41 U/L (ref 39–117)
BUN: 19 mg/dL (ref 6–23)
Creatinine, Ser: 0.8 mg/dL (ref 0.4–1.2)
Total Bilirubin: 0.7 mg/dL (ref 0.3–1.2)

## 2012-08-11 MED ORDER — MECLIZINE HCL 25 MG PO TABS: 25.0000 mg | ORAL_TABLET | Freq: Three times a day (TID) | ORAL | Status: DC | PRN

## 2012-08-11 NOTE — Assessment & Plan Note (Signed)
bp in fair control at this time  No changes needed  Disc lifstyle change with low sodium diet and exercise  Lab today 

## 2012-08-11 NOTE — Assessment & Plan Note (Signed)
This comes and goes Has L ear pain/ mild today- nl exam Refilled meclizine and disc safety in detail  If no imp or worse will call/ f/u

## 2012-08-11 NOTE — Patient Instructions (Addendum)
Drink water - avoid tomato juice  Elevate feet whenever you sit  Avoid salty foods  Lab today Update me if not improved  Take the meclizine for dizziness as needed - update me if this does not improve soon -and be careful Watch out for dizziness with this medicine

## 2012-08-11 NOTE — Progress Notes (Signed)
Subjective:    Patient ID: Chloe Cooper, female    DOB: [birth year], 79 y.o.   MRN: [id number]  HPI Here for swollen ankles - happened over the weekend  This am is overall improved  Dependent edema  No pain   Her vertigo is acting up today  This comes and goes  Feels like the room is spinning   No sob or chest pain   Wt is up 7 lb   She uses "lite salt" on tomato- and cut that out  Also likes potato chips - stopped them this week  Was also drinking some tomato juice  Has been standing and sitting a lot  No cp or sob with exertion No pnd or orthopnea    Chemistry      Component Value Date/Time   NA 142 10/05/2011 1053   K 4.9 10/05/2011 1053   CL 105 10/05/2011 1053   CO2 28 10/05/2011 1053   BUN 15 10/05/2011 1053   CREATININE 0.7 10/05/2011 1053      Component Value Date/Time   CALCIUM 9.8 10/05/2011 1053   ALKPHOS 40 10/05/2011 1053   AST 17 10/05/2011 1053   ALT 12 10/05/2011 1053   BILITOT 0.8 10/05/2011 1053       Now elevating her feet more   Is drinking water   Patient Active Problem List   Diagnosis Date Noted  . Pedal edema 08/11/2012  . Benign paroxysmal positional vertigo 06/09/2012  . Increased frequency of headaches 06/09/2012  . Recurrent UTI 05/25/2011  . Back pain, thoracic 11/11/2010  . Osteoarthritis 08/21/2010  . UNSPECIFIED VITAMIN D DEFICIENCY 10/08/2008  . ESSENTIAL HYPERTENSION, BENIGN 10/02/2008  . HYPERCHOLESTEROLEMIA 07/19/2006  . OSTEOPOROSIS 07/19/2006   Past Medical History  Diagnosis Date  . GERD (gastroesophageal reflux disease)   . Osteoporosis   . 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 Wrist fracture, right 2008  . Diverticulitis 2009  . Gallstones 2009  . Hyperlipidemia   . Degenerative disc disease, lumbar     some spinal stenosis  . Shingles   . Baker's cyst   . Recurrent UTI   . Cataract    Past Surgical History  Procedure Laterality Date  . Appendectomy    . Partial hysterectomy    .  Cholecystectomy  2012   . 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 Trigger finger release      bil hands  . Breast surgery      cyst removed left breast  . Cataracts surg      bil   History  Substance Use Topics  . Smoking status: Never Smoker   . Smokeless tobacco: Never Used  . Alcohol Use: No   Family History  Problem Relation Age of Onset  . Heart disease Father     CAD  . Heart disease Brother     CAD  . Heart disease Brother     CAD  . Diverticulitis Sister    Allergies  Allergen Reactions  . Alendronate Sodium Swelling    REACTION: GI  . Penicillins     REACTION: rash  . Sulfonamide Derivatives     REACTION: rash   Current Outpatient Prescriptions on File Prior to Visit  Medication Sig Dispense Refill  . alum & mag hydroxide-simeth (MAALOX PLUS) 400-400-40 MG/5ML suspension Take by mouth every 6 (six) hours as needed.        Marland Kitchen aspirin 81 MG  tablet Take 81 mg by mouth daily.       . Cholecalciferol (VITAMIN D) 2000 UNITS CAPS Take 1 capsule by mouth daily.        . Hypromellose ([facility] GENTLE LUBRICANT EYE DROPS OP) Apply to eye as needed.      Marland Kitchen OVER THE COUNTER MEDICATION [facility] Pain relief 500 mg. As needed      . [DISCONTINUED] ranitidine (ZANTAC) 75 MG tablet Take 75 mg by mouth 2 (two) times daily.         No current facility-administered medications on file prior to visit.    Review of Systems Review of Systems  Constitutional: Negative for fever, appetite change, fatigue and unexpected weight change.  Eyes: Negative for pain and visual disturbance.  Respiratory: Negative for cough and shortness of breath.   Cardiovascular: Negative for cp or palpitations   neg for pnd or orthopnea  Gastrointestinal: Negative for nausea, diarrhea and constipation.  Genitourinary: Negative for urgency and frequency.  Skin: Negative for pallor or rash   Neurological: Negative for weakness, , numbness and headaches. pos for vertigo Hematological: Negative for  adenopathy. Does not bruise/bleed easily.  Psychiatric/Behavioral: Negative for dysphoric mood. The patient is not nervous/anxious.         Objective:   Physical Exam  Constitutional: She appears well-developed and well-nourished. No distress.  HENT:  Head: Normocephalic and atraumatic.  Mouth/Throat: Oropharynx is clear and moist.  Eyes: Conjunctivae and EOM are normal. Pupils are equal, round, and reactive to light. No scleral icterus.  1-2 beats of horizontal nystagmus bilaterally  Neck: Normal range of motion. Neck supple. No JVD present. Carotid bruit is not present. No thyromegaly present.  Cardiovascular: Normal rate, regular rhythm, normal heart sounds and intact distal pulses.  Exam reveals no gallop.   Pulmonary/Chest: Effort normal and breath sounds normal. No respiratory distress. She has no wheezes. She exhibits no tenderness.  Abdominal: Soft. Bowel sounds are normal. She exhibits no distension and no mass. There is no tenderness.  Musculoskeletal: She exhibits edema. She exhibits no tenderness.  Trace pedal edema bilaterally No palp cords/ tenderness or redness of legs   Lymphadenopathy:    She has no cervical adenopathy.  Neurological: She is alert. She has normal reflexes. She displays no atrophy and no tremor. No cranial nerve deficit or sensory deficit. She exhibits normal muscle tone. Coordination and gait normal.  No focal cerebellar signs  Skin: Skin is warm and dry. No rash noted. No erythema. No pallor.  Psychiatric: She has a normal mood and affect.          Assessment & Plan:

## 2012-08-11 NOTE — Assessment & Plan Note (Signed)
Without cardiac red flags and nl exam today  Suspect heat/ salt related - disc lifestyle habits in detail  Will inc water/ dec salt and elevate feet Lab today Update if this worsens again

## 2012-08-14 ENCOUNTER — Encounter: Payer: Self-pay | Admitting: *Deleted

## 2012-12-23 ENCOUNTER — Emergency Department (HOSPITAL_COMMUNITY)
Admission: EM | Admit: 2012-12-23 | Discharge: 2012-12-23 | Disposition: A | Discharge status: Home / Self Care | Payer: No Typology Code available for payment source | Attending: Emergency Medicine | Admitting: Emergency Medicine

## 2012-12-23 ENCOUNTER — Emergency Department (HOSPITAL_COMMUNITY): Payer: No Typology Code available for payment source

## 2012-12-23 ENCOUNTER — Encounter (HOSPITAL_COMMUNITY): Payer: Self-pay | Admitting: Emergency Medicine

## 2012-12-23 DIAGNOSIS — Z8719 Personal history of other diseases of the digestive system: Secondary | ICD-10-CM | POA: Insufficient documentation

## 2012-12-23 DIAGNOSIS — M810 Age-related osteoporosis without current pathological fracture: Secondary | ICD-10-CM | POA: Insufficient documentation

## 2012-12-23 DIAGNOSIS — M5137 Other intervertebral disc degeneration, lumbosacral region: Secondary | ICD-10-CM | POA: Insufficient documentation

## 2012-12-23 DIAGNOSIS — S8010XA Contusion of unspecified lower leg, initial encounter: Secondary | ICD-10-CM | POA: Insufficient documentation

## 2012-12-23 DIAGNOSIS — S81009A Unspecified open wound, unspecified knee, initial encounter: Secondary | ICD-10-CM | POA: Insufficient documentation

## 2012-12-23 DIAGNOSIS — Z8619 Personal history of other infectious and parasitic diseases: Secondary | ICD-10-CM | POA: Insufficient documentation

## 2012-12-23 DIAGNOSIS — K219 Gastro-esophageal reflux disease without esophagitis: Secondary | ICD-10-CM | POA: Insufficient documentation

## 2012-12-23 DIAGNOSIS — M25562 Pain in left knee: Secondary | ICD-10-CM

## 2012-12-23 DIAGNOSIS — M25569 Pain in unspecified knee: Secondary | ICD-10-CM | POA: Insufficient documentation

## 2012-12-23 DIAGNOSIS — Y9389 Activity, other specified: Secondary | ICD-10-CM | POA: Insufficient documentation

## 2012-12-23 DIAGNOSIS — Z9849 Cataract extraction status, unspecified eye: Secondary | ICD-10-CM | POA: Insufficient documentation

## 2012-12-23 DIAGNOSIS — M129 Arthropathy, unspecified: Secondary | ICD-10-CM | POA: Insufficient documentation

## 2012-12-23 DIAGNOSIS — Z882 Allergy status to sulfonamides status: Secondary | ICD-10-CM | POA: Insufficient documentation

## 2012-12-23 DIAGNOSIS — Z79899 Other long term (current) drug therapy: Secondary | ICD-10-CM | POA: Insufficient documentation

## 2012-12-23 DIAGNOSIS — Z880 Allergy status to penicillin: Secondary | ICD-10-CM | POA: Insufficient documentation

## 2012-12-23 DIAGNOSIS — Z888 Allergy status to other drugs, medicaments and biological substances status: Secondary | ICD-10-CM | POA: Insufficient documentation

## 2012-12-23 DIAGNOSIS — E785 Hyperlipidemia, unspecified: Secondary | ICD-10-CM | POA: Insufficient documentation

## 2012-12-23 DIAGNOSIS — Y92410 Unspecified street and highway as the place of occurrence of the external cause: Secondary | ICD-10-CM | POA: Insufficient documentation

## 2012-12-23 DIAGNOSIS — Z7982 Long term (current) use of aspirin: Secondary | ICD-10-CM | POA: Insufficient documentation

## 2012-12-23 DIAGNOSIS — M51379 Other intervertebral disc degeneration, lumbosacral region without mention of lumbar back pain or lower extremity pain: Secondary | ICD-10-CM | POA: Insufficient documentation

## 2012-12-23 DIAGNOSIS — S81812A Laceration without foreign body, left lower leg, initial encounter: Secondary | ICD-10-CM

## 2012-12-23 DIAGNOSIS — Z87440 Personal history of urinary (tract) infections: Secondary | ICD-10-CM | POA: Insufficient documentation
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
## 2012-12-28 ENCOUNTER — Ambulatory Visit (INDEPENDENT_AMBULATORY_CARE_PROVIDER_SITE_OTHER): Payer: Medicare Other | Admitting: Family Medicine

## 2012-12-28 ENCOUNTER — Encounter: Payer: Self-pay | Admitting: Family Medicine

## 2012-12-28 DIAGNOSIS — M79609 Pain in unspecified limb: Secondary | ICD-10-CM

## 2012-12-28 NOTE — Patient Instructions (Signed)
I do think you have ribcage bruise - treat with increased tylenol to 500mg  twice daily for 5 days. Use heating pad on ribcage. If worsening pain or shortness of breath, return to see [place]. Otherwise this should heal with time. Good to see you today, call us with quetsions.
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
## 2013-01-01 NOTE — ED Provider Notes (Signed)
LACERATION REPAIR Performed by: Otilio Miu Authorized by: Ruby Cola E Consent: Verbal consent obtained. Risks and benefits: risks, benefits and alternatives were discussed Consent given by: patient Patient identity confirmed: provided demographic data Prepped and Draped in normal sterile fashion Wound explored  Laceration Location: L anterior lower leg  Laceration Length: 3cm  No Foreign Bodies seen or palpated  Anesthesia: none  Irrigation method: syringe Amount of cleaning: standard  Skin closure: dermabond  Patient tolerance: Patient tolerated the procedure well with no immediate complications.   Otilio Miu, PA-C 01/01/13 2036

## 2013-01-01 NOTE — ED Provider Notes (Signed)
Medical screening examination/treatment/procedure(s) were performed by non-physician practitioner and as supervising physician I was immediately available for consultation/collaboration.  EKG Interpretation   None         Dagmar Hait, MD 01/01/13 2257

## 2013-02-13 ENCOUNTER — Telehealth: Payer: Self-pay | Admitting: Family Medicine

## 2013-02-13 DIAGNOSIS — E7800 Pure hypercholesterolemia, unspecified: Secondary | ICD-10-CM

## 2013-02-13 DIAGNOSIS — M810 Age-related osteoporosis without current pathological fracture: Secondary | ICD-10-CM

## 2013-02-13 DIAGNOSIS — E559 Vitamin D deficiency, unspecified: Secondary | ICD-10-CM

## 2013-02-13 DIAGNOSIS — I10 Essential (primary) hypertension: Secondary | ICD-10-CM

## 2013-02-13 NOTE — Telephone Encounter (Signed)
Message copied by Judy Pimple on Tue Feb 13, 2013  8:02 AM ------      Message from: Alvina Chou      Created: Fri Feb 02, 2013  2:13 PM      Regarding: Lab orders for [place], 12.10.14       Patient is scheduled for CPX labs, please order future labs, Thanks , Terri       ------

## 2013-02-14 ENCOUNTER — Other Ambulatory Visit (INDEPENDENT_AMBULATORY_CARE_PROVIDER_SITE_OTHER): Payer: Medicare Other

## 2013-02-14 DIAGNOSIS — E7800 Pure hypercholesterolemia, unspecified: Secondary | ICD-10-CM

## 2013-02-14 DIAGNOSIS — E559 Vitamin D deficiency, unspecified: Secondary | ICD-10-CM

## 2013-02-14 DIAGNOSIS — M810 Age-related osteoporosis without current pathological fracture: Secondary | ICD-10-CM

## 2013-02-14 DIAGNOSIS — I10 Essential (primary) hypertension: Secondary | ICD-10-CM

## 2013-02-14 LAB — CBC WITH DIFFERENTIAL/PLATELET
Basophils Absolute: 0 10*3/uL (ref 0.0–0.1)
Eosinophils Absolute: 0.1 10*3/uL (ref 0.0–0.7)
Hemoglobin: 12.9 g/dL (ref 12.0–15.0)
Lymphocytes Relative: 25.2 % (ref 12.0–46.0)
MCHC: 33.6 g/dL (ref 30.0–36.0)
MCV: 92.6 fl (ref 78.0–100.0)
Monocytes Relative: 8.8 % (ref 3.0–12.0)
Neutro Abs: 4.4 10*3/uL (ref 1.4–7.7)
Neutrophils Relative %: 63.4 % (ref 43.0–77.0)
Platelets: 219 10*3/uL (ref 150.0–400.0)
RDW: 13.3 % (ref 11.5–14.6)

## 2013-02-14 LAB — LIPID PANEL
Cholesterol: 283 mg/dL — ABNORMAL HIGH (ref 0–200)
HDL: 40.6 mg/dL (ref >39.00)
Triglycerides: 263 mg/dL — ABNORMAL HIGH (ref 0.0–149.0)

## 2013-02-14 LAB — COMPREHENSIVE METABOLIC PANEL
ALT: 10 U/L (ref 0–35)
AST: 17 U/L (ref 0–37)
Albumin: 4.2 g/dL (ref 3.5–5.2)
BUN: 17 mg/dL (ref 6–23)
CO2: 26 mEq/L (ref 19–32)
Calcium: 9.4 mg/dL (ref 8.4–10.5)
Chloride: 104 mEq/L (ref 96–112)
GFR: 64.39 mL/min (ref >60.00)
Glucose, Bld: 102 mg/dL — ABNORMAL HIGH (ref 70–99)
Potassium: 4.4 mEq/L (ref 3.5–5.1)
Total Protein: 7.1 g/dL (ref 6.0–8.3)

## 2013-02-15 LAB — VITAMIN D 25 HYDROXY (VIT D DEFICIENCY, FRACTURES): Vit D, 25-Hydroxy: 58 ng/mL (ref 30–89)

## 2013-02-21 ENCOUNTER — Encounter: Payer: Medicare Other | Admitting: Family Medicine

## 2013-02-23 ENCOUNTER — Encounter: Payer: Self-pay | Admitting: Family Medicine

## 2013-02-23 ENCOUNTER — Ambulatory Visit (INDEPENDENT_AMBULATORY_CARE_PROVIDER_SITE_OTHER): Payer: Medicare Other | Admitting: Family Medicine

## 2013-02-23 VITALS — BP 124/70 | HR 70 | Temp 98.4°F | Ht 60.5 in | Wt 149.0 lb

## 2013-02-23 DIAGNOSIS — Z0000 Encounter for general adult medical examination without abnormal findings: Secondary | ICD-10-CM

## 2013-02-23 DIAGNOSIS — E559 Vitamin D deficiency, unspecified: Secondary | ICD-10-CM

## 2013-02-23 DIAGNOSIS — E7800 Pure hypercholesterolemia, unspecified: Secondary | ICD-10-CM

## 2013-02-23 DIAGNOSIS — I10 Essential (primary) hypertension: Secondary | ICD-10-CM

## 2013-02-23 DIAGNOSIS — M810 Age-related osteoporosis without current pathological fracture: Secondary | ICD-10-CM

## 2013-02-23 NOTE — Progress Notes (Signed)
Pre-visit discussion using our clinic review tool. No additional management support is needed unless otherwise documented below in the visit note.  
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
## 2013-02-23 NOTE — Patient Instructions (Signed)
I'm glad you are doing well  Cholesterol is high off the medicine-so watch your diet the best you can  Take care of yourself Fat and Cholesterol Control Diet Fat and cholesterol levels in your blood and organs are influenced by your diet. High levels of fat and cholesterol may lead to diseases of the heart, small and large blood vessels, gallbladder, liver, and pancreas. CONTROLLING FAT AND CHOLESTEROL WITH DIET Although exercise and lifestyle factors are important, your diet is key. That is because certain foods are known to raise cholesterol and others to lower it. The goal is to balance foods for their effect on cholesterol and more importantly, to replace saturated and trans fat with other types of fat, such as monounsaturated fat, polyunsaturated fat, and omega-3 fatty acids. On average, a person should consume no more than 15 to 17 g of saturated fat daily. Saturated and trans fats are considered "bad" fats, and they will raise LDL cholesterol. Saturated fats are primarily found in animal products such as meats, butter, and cream. However, that does not mean you need to give up all your favorite foods. Today, there are good tasting, low-fat, low-cholesterol substitutes for most of the things you like to eat. Choose low-fat or nonfat alternatives. Choose round or loin cuts of red meat. These types of cuts are lowest in fat and cholesterol. Chicken (without the skin), fish, veal, and ground [place] breast are great choices. Eliminate fatty meats, such as hot dogs and salami. Even shellfish have little or no saturated fat. Have a 3 oz (85 g) portion when you eat lean meat, poultry, or fish. Trans fats are also called "partially hydrogenated oils." They are oils that have been scientifically manipulated so that they are solid at room temperature resulting in a longer shelf life and improved taste and texture of foods in which they are added. Trans fats are found in stick margarine, some tub margarines,  cookies, crackers, and baked goods.  When baking and cooking, oils are a great substitute for butter. The monounsaturated oils are especially beneficial since it is believed they lower LDL and raise HDL. The oils you should avoid entirely are saturated tropical oils, such as coconut and palm.  Remember to eat a lot from food groups that are naturally free of saturated and trans fat, including fish, fruit, vegetables, beans, grains (barley, rice, couscous, bulgur wheat), and pasta (without cream sauces).  IDENTIFYING FOODS THAT LOWER FAT AND CHOLESTEROL  Soluble fiber may lower your cholesterol. This type of fiber is found in fruits such as apples, vegetables such as broccoli, potatoes, and carrots, legumes such as beans, peas, and lentils, and grains such as barley. Foods fortified with plant sterols (phytosterol) may also lower cholesterol. You should eat at least 2 g per day of these foods for a cholesterol lowering effect.  Read package labels to identify low-saturated fats, trans fat free, and low-fat foods at the supermarket. Select cheeses that have only 2 to 3 g saturated fat per ounce. Use a heart-healthy tub margarine that is free of trans fats or partially hydrogenated oil. When buying baked goods (cookies, crackers), avoid partially hydrogenated oils. Breads and muffins should be made from whole grains (whole-wheat or whole oat flour, instead of "flour" or "enriched flour"). Buy non-creamy canned soups with reduced salt and no added fats.  FOOD PREPARATION TECHNIQUES  Never deep-fry. If you must fry, either stir-fry, which uses very little fat, or use non-stick cooking sprays. When possible, broil, bake, or roast meats,  and steam vegetables. Instead of putting butter or margarine on vegetables, use lemon and herbs, applesauce, and cinnamon (for squash and sweet potatoes). Use nonfat yogurt, salsa, and low-fat dressings for salads.  LOW-SATURATED FAT / LOW-FAT FOOD SUBSTITUTES Meats / Saturated  Fat (g)  Avoid: Steak, marbled (3 oz/85 g) / 11 g  Choose: Steak, lean (3 oz/85 g) / 4 g  Avoid: Hamburger (3 oz/85 g) / 7 g  Choose: Hamburger, lean (3 oz/85 g) / 5 g  Avoid: Ham (3 oz/85 g) / 6 g  Choose: Ham, lean cut (3 oz/85 g) / 2.4 g  Avoid: Chicken, with skin, dark meat (3 oz/85 g) / 4 g  Choose: Chicken, skin removed, dark meat (3 oz/85 g) / 2 g  Avoid: Chicken, with skin, light meat (3 oz/85 g) / 2.5 g  Choose: Chicken, skin removed, light meat (3 oz/85 g) / 1 g Dairy / Saturated Fat (g)  Avoid: Whole milk (1 cup) / 5 g  Choose: Low-fat milk, 2% (1 cup) / 3 g  Choose: Low-fat milk, 1% (1 cup) / 1.5 g  Choose: Skim milk (1 cup) / 0.3 g  Avoid: Hard cheese (1 oz/28 g) / 6 g  Choose: Skim milk cheese (1 oz/28 g) / 2 to 3 g  Avoid: Cottage cheese, 4% fat (1 cup) / 6.5 g  Choose: Low-fat cottage cheese, 1% fat (1 cup) / 1.5 g  Avoid: Ice cream (1 cup) / 9 g  Choose: Sherbet (1 cup) / 2.5 g  Choose: Nonfat frozen yogurt (1 cup) / 0.3 g  Choose: Frozen fruit bar / trace  Avoid: Whipped cream (1 tbs) / 3.5 g  Choose: Nondairy whipped topping (1 tbs) / 1 g Condiments / Saturated Fat (g)  Avoid: Mayonnaise (1 tbs) / 2 g  Choose: Low-fat mayonnaise (1 tbs) / 1 g  Avoid: Butter (1 tbs) / 7 g  Choose: Extra light margarine (1 tbs) / 1 g  Avoid: Coconut oil (1 tbs) / 11.8 g  Choose: Olive oil (1 tbs) / 1.8 g  Choose: Corn oil (1 tbs) / 1.7 g  Choose: Safflower oil (1 tbs) / 1.2 g  Choose: Sunflower oil (1 tbs) / 1.4 g  Choose: Soybean oil (1 tbs) / 2.4 g  Choose: Canola oil (1 tbs) / 1 g Document Released: 02/22/2005 Document Revised: 06/19/2012 Document Reviewed: 08/13/2010 [employer] Patient Information 2014 [place], [place].

## 2013-02-25 NOTE — Assessment & Plan Note (Signed)
BP: 124/70 mmHg  bp in fair control at this time  No changes needed Disc lifstyle change with low sodium diet and exercise   Lab reviewed

## 2013-02-25 NOTE — Assessment & Plan Note (Signed)
Pt declines several health mt items due to age Reviewed health habits including diet and exercise and skin cancer prevention Reviewed appropriate screening tests for age  Also reviewed health mt list, fam hx and immunization status , as well as social and family history   See HPI Labs reviewed

## 2013-02-25 NOTE — Assessment & Plan Note (Signed)
Lipids are up off simvastatin  Pt declines other medicines Disc goals for lipids and reasons to control them Rev labs with pt Rev low sat fat diet in detail  Handout given for diet

## 2013-02-25 NOTE — Assessment & Plan Note (Signed)
D level is theraputic  Enc to continue current dose  Disc imp to bone and overall health

## 2013-02-25 NOTE — Assessment & Plan Note (Signed)
D level is ok  Pt declines further bone density tests Disc fall prev  Disc need for calcium/ vitamin D/ wt bearing exercise and bone density test every 2 y to monitor Disc safety/ fracture risk in detail

## 2013-06-20 ENCOUNTER — Other Ambulatory Visit: Payer: Self-pay | Admitting: Dermatology

## 2013-07-05 ENCOUNTER — Encounter: Payer: Self-pay | Admitting: Internal Medicine

## 2013-07-05 ENCOUNTER — Ambulatory Visit (INDEPENDENT_AMBULATORY_CARE_PROVIDER_SITE_OTHER): Payer: Medicare PPO | Admitting: Internal Medicine

## 2013-07-05 VITALS — BP 118/64 | HR 69 | Temp 97.8°F | Wt 144.2 lb

## 2013-07-05 DIAGNOSIS — N390 Urinary tract infection, site not specified: Secondary | ICD-10-CM

## 2013-07-05 DIAGNOSIS — H8110 Benign paroxysmal vertigo, unspecified ear: Secondary | ICD-10-CM

## 2013-07-05 MED ORDER — CIPROFLOXACIN HCL 500 MG PO TABS: 500.0000 mg | ORAL_TABLET | Freq: Two times a day (BID) | ORAL | Status: DC

## 2013-07-05 MED ORDER — MECLIZINE HCL 25 MG PO TABS: 25.0000 mg | ORAL_TABLET | Freq: Three times a day (TID) | ORAL | Status: DC | PRN

## 2013-07-05 NOTE — Progress Notes (Signed)
Subjective:    Patient ID: Chloe Cooper, female    DOB: 06/06/1916, 80 y.o.   MRN: [id number]  HPI  Pt presents to the clinic today with c/o dizziness. She reports this started 2 days ago. She has had some associated ear itchiness but denies pain. The dizziness is worse when she bends over. She denies syncope, nausea or vomiting but does report some decreased appetite. She reports she had similar symptoms 1 year ago. She had been diagnosed with BPPV and was treated with Meclizine. She takes Dramamine OTC which does help but reports the symptoms return once the medication wears off.  Additionally, she would like to be checked for a UTI. She has noticed that her urine is darker than normal and sometimes has a odor. She denies fever, chills, nausea, vomiting or low back pain.  Review of Systems      Past Medical History  Diagnosis Date  . GERD (gastroesophageal reflux disease)   . Osteoporosis   . 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 Wrist fracture, right 2008  . Diverticulitis 2009  . Gallstones 2009  . Hyperlipidemia   . Degenerative disc disease, lumbar     some spinal stenosis  . Shingles   . Baker's cyst   . Recurrent UTI   . Cataract     Current Outpatient Prescriptions  Medication Sig Dispense Refill  . acetaminophen (TYLENOL) 500 MG tablet Take 500 mg by mouth daily.      Marland Kitchen. aspirin 81 MG tablet Take 81 mg by mouth daily.       . Cholecalciferol (VITAMIN D) 2000 UNITS CAPS Take 1 capsule by mouth daily.       . Hypromellose ([employer] GENTLE LUBRICANT EYE DROPS OP) Apply 2 drops to eye 4 (four) times daily as needed (dry eyes).       . meclizine (ANTIVERT) 25 MG tablet Take 1 tablet (25 mg total) by mouth 3 (three) times daily as needed for dizziness.  30 tablet  1  . [DISCONTINUED] ranitidine (ZANTAC) 75 MG tablet Take 75 mg by mouth 2 (two) times daily.         No current facility-administered medications for this visit.    Allergies  Allergen  Reactions  . Alendronate Sodium Swelling    REACTION: GI  . Penicillins     REACTION: rash  . Simvastatin     Leg pain and cramping   . Sulfonamide Derivatives     REACTION: rash    Family History  Problem Relation Age of Onset  . Heart disease Father     CAD  . Heart disease Brother     CAD  . Heart disease Brother     CAD  . Diverticulitis Sister     History   Social History  . Marital Status: Married    Spouse Name: N/A    Number of Children: N/A  . Years of Education: N/A   Occupational History  . Retired     Social History Main Topics  . Smoking status: Never Smoker   . Smokeless tobacco: Never Used  . Alcohol Use: No  . Drug Use: No  . Sexual Activity: Not on file   Other Topics Concern  . 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  HEENT: Pt reports ear itchiness. Denies eye pain, eye redness,  ear pain, ringing in the ears, wax buildup, runny nose, nasal congestion, bloody nose, or sore throat. Respiratory: Denies difficulty breathing, shortness of breath, cough or sputum production.   Cardiovascular: Denies chest pain, chest tightness, palpitations or swelling in the hands or feet.  Neurological: Pt reports dizziness. Denies difficulty with speech or problems with balance and coordination.  GU: Pt reports dark urine with odor. Denies urgency, frequency or dysuria.  No other specific complaints in a complete review of systems (except as listed in HPI above).  Objective:   Physical Exam   BP 118/64  Pulse 69  Temp(Src) 97.8 F (36.6 C) (Oral)  Wt 144 lb 4 oz (65.431 kg)  SpO2 97% Wt Readings from Last 3 Encounters:  07/05/13 144 lb 4 oz (65.431 kg)  02/23/13 149 lb (67.586 kg)  12/28/12 134 lb 12 oz (61.122 kg)    General: Appears her stated age, well developed, well nourished in NAD. SHEENT: Head: normal shape and size; Eyes:  sclera white, no icterus, conjunctiva pink, PERRLA and EOMs intact; Ears: Tm's gray and intact, normal light reflex; Nose: mucosa pink and moist, septum midline; Throat/Mouth: Teeth present, mucosa pink and moist, no exudate, lesions or ulcerations noted.  Cardiovascular: Normal rate and rhythm. S1,S2 noted.  No murmur, rubs or gallops noted. No JVD or BLE edema. No carotid bruits noted. Pulmonary/Chest: Normal effort and positive vesicular breath sounds. No respiratory distress. No wheezes, rales or ronchi noted.  Abdomen: Soft and nontender. Normal bowel sounds, no bruits noted. No distention or masses noted. Liver, spleen and kidneys non palpable.  BMET    Component Value Date/Time   NA 140 02/14/2013 0813   K 4.4 02/14/2013 0813   CL 104 02/14/2013 0813   CO2 26 02/14/2013 0813   GLUCOSE 102* 02/14/2013 0813   BUN 17 02/14/2013 0813   CREATININE 0.9 02/14/2013 0813   CALCIUM 9.4 02/14/2013 0813   GFRNONAA >60 10/30/2010 1519   GFRAA >60 10/30/2010 1519    Lipid Panel     Component Value Date/Time   CHOL 283* 02/14/2013 0813   TRIG 263.0* 02/14/2013 0813   HDL 40.60 02/14/2013 0813   CHOLHDL 7 02/14/2013 0813   VLDL 52.6* 02/14/2013 0813   LDLCALC 90 10/05/2011 1053    CBC    Component Value Date/Time   WBC 6.9 02/14/2013 0813   RBC 4.16 02/14/2013 0813   HGB 12.9 02/14/2013 0813   HCT 38.5 02/14/2013 0813   PLT 219.0 02/14/2013 0813   MCV 92.6 02/14/2013 0813   MCH 31.0 10/30/2010 1519   MCHC 33.6 02/14/2013 0813   RDW 13.3 02/14/2013 0813   LYMPHSABS 1.7 02/14/2013 0813   MONOABS 0.6 02/14/2013 0813   EOSABS 0.1 02/14/2013 0813   BASOSABS 0.0 02/14/2013 0813    Hgb A1C No results found for this basename: HGBA1C        Assessment & Plan:   BPPV:  Encouraged her to stay hydrated Will refill meclizine today-discussed increased risk for falls  UTI:  Urinalysis showed trace leuks, pos nitrites eRx for Cipro 500 mg BID x 5 days Encouraged fluids  RTC as  needed or if symptoms persist or worsen

## 2013-07-05 NOTE — Patient Instructions (Signed)

## 2013-07-05 NOTE — Progress Notes (Signed)
Pre visit review using our clinic review tool, if applicable. No additional management support is needed unless otherwise documented below in the visit note. 

## 2013-07-06 LAB — POCT URINALYSIS DIPSTICK
Bilirubin, UA: NEGATIVE
Glucose, UA: NEGATIVE
Ketones, UA: NEGATIVE
NITRITE UA: POSITIVE
RBC UA: NEGATIVE
Spec Grav, UA: 1.02
Urobilinogen, UA: 0.2
pH, UA: 6.5

## 2013-07-06 NOTE — Addendum Note (Signed)
Addended by: Roena MaladyEVONTENNO, Taneshia Lorence Y on: 07/06/2013 09:14 AM   Modules accepted: Orders

## 2014-03-06 ENCOUNTER — Ambulatory Visit (INDEPENDENT_AMBULATORY_CARE_PROVIDER_SITE_OTHER): Payer: Medicare PPO | Admitting: Internal Medicine

## 2014-03-06 ENCOUNTER — Encounter: Payer: Self-pay | Admitting: Internal Medicine

## 2014-03-06 VITALS — BP 122/64 | HR 76 | Temp 97.8°F | Wt 131.0 lb

## 2014-03-06 DIAGNOSIS — N390 Urinary tract infection, site not specified: Secondary | ICD-10-CM

## 2014-03-06 DIAGNOSIS — H9202 Otalgia, left ear: Secondary | ICD-10-CM

## 2014-03-06 DIAGNOSIS — R300 Dysuria: Secondary | ICD-10-CM

## 2014-03-06 LAB — POCT URINALYSIS DIPSTICK
Blood, UA: NEGATIVE
Glucose, UA: NEGATIVE
KETONES UA: NEGATIVE
Nitrite, UA: POSITIVE
Protein, UA: NEGATIVE
Spec Grav, UA: >=1.03
Urobilinogen, UA: 0.2
pH, UA: 6

## 2014-03-06 MED ORDER — CIPROFLOXACIN HCL 250 MG PO TABS: 250.0000 mg | ORAL_TABLET | Freq: Two times a day (BID) | ORAL | Status: DC

## 2014-03-06 NOTE — Progress Notes (Signed)
Pre visit review using our clinic review tool, if applicable. No additional management support is needed unless otherwise documented below in the visit note. 

## 2014-03-06 NOTE — Progress Notes (Signed)
HPI  Pt presents to the clinic today with c/o dysuria, low back pain and a strong odor to her urine. She noticed this 1 week ago. She denies fever, chills or nausea. She denies seeing any blood in her urine. She has not tried anything OTC.  Additionally, she c/o left ear pain with internittent dizziness. She reports this started 1 week ago as well. It is intermittent. She does wear hearing aids. She denies wax buildup.   Review of Systems  Past Medical History  Diagnosis Date  . GERD (gastroesophageal reflux disease)   . Osteoporosis   . 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 Wrist fracture, right 2008  . Diverticulitis 2009  . Gallstones 2009  . Hyperlipidemia   . Degenerative disc disease, lumbar     some spinal stenosis  . Shingles   . Baker's cyst   . Recurrent UTI   . Cataract     Family History  Problem Relation Age of Onset  . Heart disease Father     CAD  . Heart disease Brother     CAD  . Heart disease Brother     CAD  . Diverticulitis Sister     History   Social History  . Marital Status: Married    Spouse Name: N/A    Number of Children: N/A  . Years of Education: N/A   Occupational History  . Retired     Social History Main Topics  . Smoking status: Never Smoker   . Smokeless tobacco: Never Used  . Alcohol Use: No  . Drug Use: No  . Sexual Activity: Not on file   Other Topics Concern  . Not on file   Social History Narrative   Coffee and Pepsi daily     Allergies  Allergen Reactions  . Alendronate Sodium Swelling    REACTION: GI  . Penicillins     REACTION: rash  . Simvastatin     Leg pain and cramping   . Sulfonamide Derivatives     REACTION: rash    Constitutional: Denies fever, malaise, fatigue, headache or abrupt weight changes.   HEENT: Pt reports left ear pain. Denies runny nose, nasal congestion or sore throat. GU: Pt reports strong urine odor and pain with urination. Denies burning sensation, blood in urine  or discharge. Skin: Denies redness, rashes, lesions or ulcercations.  Neuro: Pt reports dizziness.  No other specific complaints in a complete review of systems (except as listed in HPI above).    Objective:   Physical Exam  BP 122/64 mmHg  Pulse 76  Temp(Src) 97.8 F (36.6 C) (Oral)  Wt 131 lb (59.421 kg)  SpO2 98% Wt Readings from Last 3 Encounters:  03/06/14 131 lb (59.421 kg)  07/05/13 144 lb 4 oz (65.431 kg)  02/23/13 149 lb (67.586 kg)    General: Appears her stated age in NAD. HEENT: Left Ear: TM grey and intact, normal light reflex. No wax buildup, no effusion. Cardiovascular: Normal rate and rhythm. S1,S2 noted.  No murmur, rubs or gallops noted.  Pulmonary/Chest: Normal effort and positive vesicular breath sounds. No respiratory distress. No wheezes, rales or ronchi noted.  Abdomen: Soft. Normal bowel sounds, no bruits noted. No distention or masses noted. Liver, spleen and kidneys non palpable. Tender to palpation over the bladder area. No CVA tenderness. Neuro: Alert and oriented.     Assessment & Plan:   Strong urine odor and dysuria secondary to UTI:  Urinalysis: 3 +  leuks, 1+ blood, pos nitrites Will send urine culture eRx sent if for Cipro 250 mg BID x 5 days OK to take AZO OTC Drink plenty of fluids  Otalgia:  Normal exam ? If this is pressure from the hearing aid Monitor for now  RTC as needed or if symptoms persist.

## 2014-03-06 NOTE — Patient Instructions (Signed)

## 2014-03-09 LAB — URINE CULTURE: Colony Count: >=100000

## 2014-06-18 ENCOUNTER — Encounter: Payer: Self-pay | Admitting: Family Medicine

## 2014-06-18 ENCOUNTER — Ambulatory Visit (INDEPENDENT_AMBULATORY_CARE_PROVIDER_SITE_OTHER): Payer: Medicare PPO | Admitting: Family Medicine

## 2014-06-18 ENCOUNTER — Telehealth: Payer: Self-pay

## 2014-06-18 VITALS — BP 120/60 | HR 79 | Temp 98.1°F | Ht 60.5 in | Wt 131.0 lb

## 2014-06-18 DIAGNOSIS — I10 Essential (primary) hypertension: Secondary | ICD-10-CM | POA: Diagnosis not present

## 2014-06-18 DIAGNOSIS — H8112 Benign paroxysmal vertigo, left ear: Secondary | ICD-10-CM | POA: Diagnosis not present

## 2014-06-18 DIAGNOSIS — M546 Pain in thoracic spine: Secondary | ICD-10-CM

## 2014-06-18 DIAGNOSIS — R600 Localized edema: Secondary | ICD-10-CM

## 2014-06-18 LAB — COMPREHENSIVE METABOLIC PANEL
ALT: 6 U/L (ref 0–35)
AST: 11 U/L (ref 0–37)
Albumin: 3.9 g/dL (ref 3.5–5.2)
Alkaline Phosphatase: 69 U/L (ref 39–117)
BILIRUBIN TOTAL: 0.3 mg/dL (ref 0.2–1.2)
BUN: 19 mg/dL (ref 6–23)
CALCIUM: 9.7 mg/dL (ref 8.4–10.5)
CHLORIDE: 104 meq/L (ref 96–112)
CO2: 27 meq/L (ref 19–32)
Creatinine, Ser: 0.88 mg/dL (ref 0.40–1.20)
GFR: 63.37 mL/min (ref >60.00)
GLUCOSE: 99 mg/dL (ref 70–99)
Potassium: 4.3 mEq/L (ref 3.5–5.1)
Sodium: 137 mEq/L (ref 135–145)
Total Protein: 6.6 g/dL (ref 6.0–8.3)

## 2014-06-18 LAB — TSH: TSH: 1.96 u[IU]/mL (ref 0.35–4.50)

## 2014-06-18 MED ORDER — MECLIZINE HCL 12.5 MG PO TABS: 12.5000 mg | ORAL_TABLET | Freq: Three times a day (TID) | ORAL | Status: DC | PRN

## 2014-06-18 NOTE — Telephone Encounter (Signed)
Patient aware of lab results.

## 2014-06-18 NOTE — Progress Notes (Signed)
Pre visit review using our clinic review tool, if applicable. No additional management support is needed unless otherwise documented below in the visit note. 

## 2014-06-18 NOTE — Assessment & Plan Note (Signed)
Recurrent - R sided without urinary symptoms  Has had issues in the past with pain from re activated shingles in the past  Fairly normal exam  Will continue tylenol/ use heat or ice if helpful and update if worse No vert tenderness-do not suspect compression fx (pain is R sided) Update if not starting to improve in a week or if worsening

## 2014-06-18 NOTE — Assessment & Plan Note (Signed)
Recurrent - suspect worse in warmer weather  Suspect due to poor venous return- is dependent and no cardiac symptoms Check cmet and tsh today Suggested supp hose to knee Pt is not very bothered by it  sugg elevation of legs BP: 120/60 mmHg  -stable   Will update if this worsens
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
## 2014-06-18 NOTE — Patient Instructions (Signed)
Try the meclizine for dizziness as needed- watch out for sedation with it  If no improvement in 1-2 weeks or if worse let me know  Use your walker to prevent falls   Labs today for swelling  Try some knee high hose for support over the counter  Elevate feet whenever you sit

## 2014-06-18 NOTE — Telephone Encounter (Signed)
-----   Message from Judy PimpleMarne A Tower, MD sent at 06/18/2014  4:37 PM EDT ----- Labs all look ok -reassuring

## 2014-06-18 NOTE — Assessment & Plan Note (Signed)
This is recurrent Exam and bp are stable  Worse to turn head L  Will try 12.5 meclizine prn -warned of sedation / has helped in the past  If no imp -consider doppler of vertebral arteries given rel to turning neck / poss imaging of head Family helps and watches closely  Enc good water intake  Is stressed-taking care of ill husband -? If factor  Did enc self care

## 2014-06-18 NOTE — Assessment & Plan Note (Signed)
bp in fair control at this time  BP Readings from Last 1 Encounters:  06/18/14 120/60   No changes needed-controlled by lifestyle habits only  Disc lifstyle change with low sodium diet and exercise (as tol) Will watch dizziness and ankle edema closely

## 2014-07-17 ENCOUNTER — Telehealth: Payer: Self-pay | Admitting: Family Medicine

## 2014-07-17 DIAGNOSIS — L600 Ingrowing nail: Secondary | ICD-10-CM

## 2014-07-17 NOTE — Telephone Encounter (Signed)
Needs pod referral  Soak in soapy warm water  Will do ref now

## 2014-07-17 NOTE — Telephone Encounter (Signed)
Patient has an ingrown toenail that is infected.  Patient's daughter-in- law called and asked if you would see her for it, or if she can be referred to a podiatrist.

## 2014-07-17 NOTE — Telephone Encounter (Signed)
Daughter-in-law advise to soak foot in warm soapy water and [facility] they advise me that appt needs to be after lunch

## 2014-07-17 NOTE — Addendum Note (Signed)
Addended by: Roxy MannsWER, Ronalda Walpole A on: 07/17/2014 01:37 PM   Modules accepted: Orders

## 2014-07-25 ENCOUNTER — Ambulatory Visit: Payer: Medicare PPO | Admitting: Podiatry

## 2014-09-02 ENCOUNTER — Encounter: Payer: Self-pay | Admitting: Internal Medicine

## 2014-09-02 ENCOUNTER — Ambulatory Visit (INDEPENDENT_AMBULATORY_CARE_PROVIDER_SITE_OTHER): Payer: Medicare PPO | Admitting: Internal Medicine

## 2014-09-02 VITALS — BP 124/58 | HR 73 | Temp 98.3°F | Wt 131.8 lb

## 2014-09-02 DIAGNOSIS — R8290 Unspecified abnormal findings in urine: Secondary | ICD-10-CM | POA: Diagnosis not present

## 2014-09-02 DIAGNOSIS — N390 Urinary tract infection, site not specified: Secondary | ICD-10-CM | POA: Diagnosis not present

## 2014-09-02 LAB — POCT URINALYSIS DIPSTICK
Blood, UA: NEGATIVE
GLUCOSE UA: NEGATIVE
NITRITE UA: POSITIVE
Urobilinogen, UA: 1
pH, UA: 5.5

## 2014-09-02 MED ORDER — CIPROFLOXACIN HCL 250 MG PO TABS: 250.0000 mg | ORAL_TABLET | Freq: Two times a day (BID) | ORAL | Status: DC

## 2014-09-02 NOTE — Patient Instructions (Signed)

## 2014-09-02 NOTE — Progress Notes (Signed)
HPI  Pt presents to the clinic today with c/o dark urine with a bad odor. She reports this started 1 week ago. She denies burning sensation but has noticed some pain in her lower back. She denies fever, chills or body aches. She has not tried anything OTC. She reports she has had UTI's in the past and reports this feels the same.   Review of Systems  Past Medical History  Diagnosis Date  . GERD (gastroesophageal reflux disease)   . Osteoporosis   . 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 Wrist fracture, right 2008  . Diverticulitis 2009  . Gallstones 2009  . Hyperlipidemia   . Degenerative disc disease, lumbar     some spinal stenosis  . Shingles   . Baker's cyst   . Recurrent UTI   . Cataract     Family History  Problem Relation Age of Onset  . Heart disease Father     CAD  . Heart disease Brother     CAD  . Heart disease Brother     CAD  . Diverticulitis Sister     History   Social History  . Marital Status: Married    Spouse Name: N/A  . Number of Children: N/A  . Years of Education: N/A   Occupational History  . Retired     Social History Main Topics  . Smoking status: Never Smoker   . Smokeless tobacco: Never Used  . Alcohol Use: No  . Drug Use: No  . Sexual Activity: Not on file   Other Topics Concern  . Not on file   Social History Narrative   Coffee and [employer] daily     Allergies  Allergen Reactions  . Alendronate Sodium Swelling    REACTION: GI  . Penicillins     REACTION: rash  . Simvastatin     Leg pain and cramping   . Sulfonamide Derivatives     REACTION: rash    Constitutional: Denies fever, malaise, fatigue, headache or abrupt weight changes.   GU: Pt reports abnormal urine odor and cloudy urine. Denies urgency, frequency, burning sensation, blood in urine or discharge. Skin: Denies redness, rashes, lesions or ulcercations.   No other specific complaints in a complete review of systems (except as listed in HPI  above).    Objective:   Physical Exam  BP 124/58 mmHg  Pulse 73  Temp(Src) 98.3 F (36.8 C) (Oral)  Wt 131 lb 12 oz (59.761 kg)  SpO2 98%  Wt Readings from Last 3 Encounters:  09/02/14 131 lb 12 oz (59.761 kg)  06/18/14 131 lb (59.421 kg)  03/06/14 131 lb (59.421 kg)    General: Appears her stated age, well developed, well nourished in NAD. Cardiovascular: Normal rate and rhythm. S1,S2 noted.  Pulmonary/Chest: Normal effort and positive vesicular breath sounds.  Abdomen: Soft and nontender. Normal bowel sounds, no bruits noted. No distention or masses noted. No CVA tenderness.      Assessment & Plan:  Abnormal urine odor, cloudy urine secondary to UTI:  Urinalysis: 3+ leuks, pos nitrites, trace blood Will send urine culture eRx sent if for Cipro 250 mg BID x 5 days Drink plenty of fluids  RTC as needed or if symptoms persist.

## 2014-09-02 NOTE — Progress Notes (Signed)
Pre visit review using our clinic review tool, if applicable. No additional management support is needed unless otherwise documented below in the visit note. 

## 2014-09-05 LAB — URINE CULTURE: Colony Count: >=100000

## 2014-10-30 ENCOUNTER — Encounter: Payer: Self-pay | Admitting: Family Medicine

## 2014-10-30 ENCOUNTER — Ambulatory Visit (INDEPENDENT_AMBULATORY_CARE_PROVIDER_SITE_OTHER): Payer: Medicare PPO | Admitting: Family Medicine

## 2014-10-30 ENCOUNTER — Ambulatory Visit (INDEPENDENT_AMBULATORY_CARE_PROVIDER_SITE_OTHER)
Admission: RE | Admit: 2014-10-30 | Discharge: 2014-10-30 | Disposition: A | Discharge status: Home / Self Care | Payer: Medicare PPO | Source: Ambulatory Visit | Attending: Family Medicine | Admitting: Family Medicine

## 2014-10-30 ENCOUNTER — Ambulatory Visit
Admission: RE | Admit: 2014-10-30 | Discharge: 2014-10-30 | Disposition: A | Discharge status: Home / Self Care | Payer: Medicare PPO | Source: Ambulatory Visit | Attending: Family Medicine | Admitting: Family Medicine

## 2014-10-30 VITALS — BP 118/70 | HR 72 | Temp 98.2°F | Wt 132.2 lb

## 2014-10-30 DIAGNOSIS — M25562 Pain in left knee: Principal | ICD-10-CM

## 2014-10-30 DIAGNOSIS — M545 Low back pain, unspecified: Secondary | ICD-10-CM | POA: Insufficient documentation

## 2014-10-30 DIAGNOSIS — M25561 Pain in right knee: Secondary | ICD-10-CM

## 2014-10-30 DIAGNOSIS — Z23 Encounter for immunization: Secondary | ICD-10-CM | POA: Diagnosis not present

## 2014-10-30 DIAGNOSIS — R3915 Urgency of urination: Secondary | ICD-10-CM

## 2014-10-30 LAB — POCT URINALYSIS DIPSTICK
Bilirubin, UA: NEGATIVE
Blood, UA: NEGATIVE
Glucose, UA: NEGATIVE
Ketones, UA: NEGATIVE
Leukocytes, UA: NEGATIVE
Nitrite, UA: NEGATIVE
Protein, UA: NEGATIVE
Spec Grav, UA: 1.025
UROBILINOGEN UA: 0.2
pH, UA: 6
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
## 2014-10-30 NOTE — Progress Notes (Signed)
Pre visit review using our clinic review tool, if applicable. No additional management support is needed unless otherwise documented below in the visit note. 

## 2014-10-30 NOTE — Assessment & Plan Note (Signed)
Pain in upper lumbar vertebrae on exam in pt with OP  Want to r/o compression fracture  Has known deg disc dz  Likely OA also  Does well for her age  No radiculopathy  Will continue use of walker  inst to try 2 reg st acetaminophen up to every 4-6 hours as needed-this may be more helpful  xr LS today

## 2014-10-30 NOTE — Assessment & Plan Note (Addendum)
Suspect OA given history /symptomatology and age  Overall reassuring exam xrays today  inst that she can take 2 reg strength acetaminophen up to every 4-6 hours - this may help pain  Use walker for help with ambulation  May need a chair lift in the future

## 2014-10-30 NOTE — Patient Instructions (Signed)
Use walker all the time  Try tylenol (acetaminophen) regular strength up to 2 pills every 4-6 hours as needed for knee and back pain  xrays today  We will get back to you with a result and plan

## 2015-02-18 ENCOUNTER — Encounter: Payer: Self-pay | Admitting: Family Medicine

## 2015-02-18 ENCOUNTER — Ambulatory Visit (INDEPENDENT_AMBULATORY_CARE_PROVIDER_SITE_OTHER): Payer: Medicare PPO | Admitting: Family Medicine

## 2015-02-18 VITALS — BP 128/58 | HR 85 | Temp 98.1°F | Ht 60.5 in | Wt 137.8 lb

## 2015-02-18 DIAGNOSIS — R300 Dysuria: Secondary | ICD-10-CM

## 2015-02-18 DIAGNOSIS — N390 Urinary tract infection, site not specified: Secondary | ICD-10-CM | POA: Insufficient documentation

## 2015-02-18 DIAGNOSIS — N3000 Acute cystitis without hematuria: Secondary | ICD-10-CM

## 2015-02-18 LAB — POCT URINALYSIS DIPSTICK
BILIRUBIN UA: NEGATIVE
GLUCOSE UA: NEGATIVE
Nitrite, UA: POSITIVE
RBC UA: NEGATIVE
Spec Grav, UA: >=1.03
Urobilinogen, UA: 0.2
pH, UA: 6

## 2015-02-18 MED ORDER — CIPROFLOXACIN HCL 250 MG PO TABS: 250.0000 mg | ORAL_TABLET | Freq: Two times a day (BID) | ORAL | Status: DC
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
## 2015-02-18 NOTE — Patient Instructions (Signed)
Try to drink one or two more bottles of water per day  Take the cipro as directed for urine infection  Also -stop Listerine and rinse with salt water instead   We will update you when urine culture comes back   Update if not starting to improve in a week or if worsening

## 2015-02-18 NOTE — Progress Notes (Signed)
Pre visit review using our clinic review tool, if applicable. No additional management support is needed unless otherwise documented below in the visit note. 

## 2015-02-20 LAB — URINE CULTURE: Colony Count: >=100000

## 2015-02-20 NOTE — Assessment & Plan Note (Signed)
Pos ua cx pending tx with low dose cipro  Enc to inc water intake  Update if not starting to improve in a several days or if worsening

## 2015-02-23 ENCOUNTER — Telehealth: Payer: Self-pay | Admitting: Family Medicine

## 2015-02-23 MED ORDER — NITROFURANTOIN MONOHYD MACRO 100 MG PO CAPS: 100.0000 mg | ORAL_CAPSULE | Freq: Two times a day (BID) | ORAL | Status: DC

## 2015-02-23 NOTE — Telephone Encounter (Signed)
Urine cx shows resistance to cipro-so I need to change px to macrobid  Please call it in  Update me if no imp in symptoms with this  Enc water intake

## 2015-02-24 MED ORDER — NITROFURANTOIN MONOHYD MACRO 100 MG PO CAPS: 100.0000 mg | ORAL_CAPSULE | Freq: Two times a day (BID) | ORAL | Status: DC

## 2015-02-24 NOTE — Telephone Encounter (Signed)
Pt notified of Dr. Royden Purlower's comments and instructions and new Rx sent to pharmacy

## 2015-04-15 ENCOUNTER — Other Ambulatory Visit: Payer: Self-pay | Admitting: Physician Assistant

## 2015-04-22 ENCOUNTER — Ambulatory Visit (INDEPENDENT_AMBULATORY_CARE_PROVIDER_SITE_OTHER): Payer: Medicare PPO | Admitting: Family Medicine

## 2015-04-22 ENCOUNTER — Encounter: Payer: Self-pay | Admitting: Family Medicine

## 2015-04-22 VITALS — BP 124/58 | HR 69 | Temp 97.5°F | Ht 60.5 in | Wt 141.5 lb

## 2015-04-22 DIAGNOSIS — N390 Urinary tract infection, site not specified: Secondary | ICD-10-CM | POA: Diagnosis not present

## 2015-04-22 DIAGNOSIS — Z87440 Personal history of urinary (tract) infections: Secondary | ICD-10-CM

## 2015-04-22 DIAGNOSIS — R208 Other disturbances of skin sensation: Secondary | ICD-10-CM

## 2015-04-22 LAB — POC URINALSYSI DIPSTICK (AUTOMATED)
Glucose, UA: NEGATIVE
Leukocytes, UA: NEGATIVE
NITRITE UA: NEGATIVE
PH UA: 5.5
RBC UA: NEGATIVE
Urobilinogen, UA: 0.2

## 2015-04-22 MED ORDER — NYSTATIN 100000 UNIT/ML MT SUSP
OROMUCOSAL | Status: DC
Start: 2015-04-22 — End: 2015-11-25

## 2015-04-22 NOTE — Patient Instructions (Signed)
For the mouth pain- try nystatin mouthwash (for thrush)- 1 teaspoon swish for 30 seconds and then swallow  If not improved in a week let me know  Also increase water intake (urine is too concentrated)- increase to 5-6 (12 oz) bottles a day -gradually  -see how you do
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
## 2015-04-22 NOTE — Progress Notes (Signed)
Pre visit review using our clinic review tool, if applicable. No additional management support is needed unless otherwise documented below in the visit note. 

## 2015-04-24 NOTE — Assessment & Plan Note (Signed)
ua is clear today- reassuring/ but concentrated  Disc need for more fluids/water Made plan for this  Hope this may help prev uti and preserve renal fxn

## 2015-04-24 NOTE — Assessment & Plan Note (Signed)
?   If poss thrush from prev abx  Trial of nystatin  Fairly nl exam  Burning mouth syndrome is also in the differential Update if not starting to improve in a week or if worsening

## 2015-07-09 ENCOUNTER — Ambulatory Visit: Payer: Medicare PPO | Admitting: Family Medicine

## 2015-07-09 ENCOUNTER — Ambulatory Visit (INDEPENDENT_AMBULATORY_CARE_PROVIDER_SITE_OTHER): Payer: Medicare PPO | Admitting: Internal Medicine

## 2015-07-09 ENCOUNTER — Encounter: Payer: Self-pay | Admitting: Internal Medicine

## 2015-07-09 VITALS — BP 124/58 | HR 71 | Temp 98.0°F | Wt 143.5 lb

## 2015-07-09 DIAGNOSIS — L299 Pruritus, unspecified: Secondary | ICD-10-CM

## 2015-07-09 DIAGNOSIS — R350 Frequency of micturition: Secondary | ICD-10-CM | POA: Diagnosis not present

## 2015-07-09 DIAGNOSIS — R8299 Other abnormal findings in urine: Secondary | ICD-10-CM | POA: Diagnosis not present

## 2015-07-09 DIAGNOSIS — R82998 Other abnormal findings in urine: Secondary | ICD-10-CM

## 2015-07-09 LAB — POC URINALSYSI DIPSTICK (AUTOMATED)
Bilirubin, UA: NEGATIVE
Blood, UA: NEGATIVE
Glucose, UA: NEGATIVE
KETONES UA: NEGATIVE
Nitrite, UA: NEGATIVE
PROTEIN UA: NEGATIVE
Spec Grav, UA: 1.02
Urobilinogen, UA: NEGATIVE
pH, UA: 6
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
## 2015-07-09 NOTE — Patient Instructions (Signed)
Pruritus °Pruritus is an itching feeling. There are many different conditions and factors that can make your skin itchy. Dry skin is one of the most common causes of itching. Most cases of itching do not require medical attention. Itchy skin can turn into a rash.  °HOME CARE INSTRUCTIONS  °Watch your pruritus for any changes. Take these steps to help with your condition:  °Skin Care °· Moisturize your skin as needed. A moisturizer that contains petroleum jelly is best for keeping moisture in your skin. °· Take or apply medicines only as directed by your health care provider. This may include: °¨ Corticosteroid cream. °¨ Anti-itch lotions. °¨ Oral anti-histamines. °· Apply cool compresses to the affected areas. °· Try taking a bath with: °¨ Epsom salts. Follow the instructions on the packaging. You can get these at your local pharmacy or grocery store. °¨ Baking soda. Pour a small amount into the bath as directed by your health care provider. °¨ Colloidal oatmeal. Follow the instructions on the packaging. You can get this at your local pharmacy or grocery store. °· Try applying baking soda paste to your skin. Stir water into baking soda until it reaches a paste-like consistency.   °· Do not scratch your skin. °· Avoid hot showers or baths, which can make itching worse. A cold shower may help with itching as long as you use a moisturizer after. °· Avoid scented soaps, detergents, and perfumes. Use gentle soaps, detergents, perfumes, and other cosmetic products. °General Instructions °· Avoid wearing tight clothes. °· Keep a journal to help track what causes your itch. Write down: °¨ What you eat. °¨ What cosmetic products you use. °¨ What you drink. °¨ What you wear. This includes jewelry. °· Use a humidifier. This keeps the air moist, which helps to prevent dry skin. °SEEK MEDICAL CARE IF: °· The itching does not go away after several days. °· You sweat at night. °· You have weight loss. °· You are unusually  thirsty. °· You urinate more than normal. °· You are more tired than normal. °· You have abdominal pain. °· Your skin tingles. °· You feel weak. °· Your skin or the whites of your eyes look yellow (jaundice). °· Your skin feels numb. °  °This information is not intended to replace advice given to you by your health care provider. Make sure you discuss any questions you have with your health care provider. °  °Document Released: 11/04/2010 Document Revised: 07/09/2014 Document Reviewed: 02/18/2014 °Elsevier Interactive Patient Education ©2016 [employer] ° °

## 2015-07-09 NOTE — Addendum Note (Signed)
Addended by: Roena MaladyEVONTENNO, Kambria Grima Y on: 07/09/2015 04:57 PM   Modules accepted: Orders

## 2015-07-09 NOTE — Progress Notes (Signed)
Pre visit review using our clinic review tool, if applicable. No additional management support is needed unless otherwise documented below in the visit note. 

## 2015-07-12 LAB — URINE CULTURE: Colony Count: 50000

## 2015-08-21 ENCOUNTER — Telehealth: Payer: Self-pay | Admitting: Family Medicine

## 2015-08-21 ENCOUNTER — Ambulatory Visit: Payer: Medicare PPO | Admitting: Family Medicine

## 2015-08-21 NOTE — Telephone Encounter (Signed)
Patient cancelled her appointment for today with Dr.Gutierrez.  I let the person who called know that patient needed to schedule her AWV.  Patient said she only comes to the doctor when she's sick and she didn't want to schedule an AWV.

## 2015-11-19 ENCOUNTER — Ambulatory Visit (INDEPENDENT_AMBULATORY_CARE_PROVIDER_SITE_OTHER): Payer: Medicare PPO

## 2015-11-19 ENCOUNTER — Encounter: Payer: Self-pay | Admitting: Family Medicine

## 2015-11-19 ENCOUNTER — Telehealth: Payer: Self-pay | Admitting: Radiology

## 2015-11-19 ENCOUNTER — Telehealth (INDEPENDENT_AMBULATORY_CARE_PROVIDER_SITE_OTHER): Payer: Medicare PPO | Admitting: Family Medicine

## 2015-11-19 VITALS — BP 118/66 | HR 78 | Temp 98.8°F | Ht 61.0 in | Wt 146.2 lb

## 2015-11-19 DIAGNOSIS — E559 Vitamin D deficiency, unspecified: Secondary | ICD-10-CM | POA: Diagnosis not present

## 2015-11-19 DIAGNOSIS — N390 Urinary tract infection, site not specified: Secondary | ICD-10-CM

## 2015-11-19 DIAGNOSIS — Z0000 Encounter for general adult medical examination without abnormal findings: Secondary | ICD-10-CM | POA: Diagnosis not present

## 2015-11-19 DIAGNOSIS — R7989 Other specified abnormal findings of blood chemistry: Secondary | ICD-10-CM | POA: Diagnosis not present

## 2015-11-19 DIAGNOSIS — I10 Essential (primary) hypertension: Secondary | ICD-10-CM

## 2015-11-19 DIAGNOSIS — E7800 Pure hypercholesterolemia, unspecified: Secondary | ICD-10-CM

## 2015-11-19 LAB — LDL CHOLESTEROL, DIRECT: LDL DIRECT: 134 mg/dL

## 2015-11-19 LAB — LIPID PANEL
CHOL/HDL RATIO: 5
Cholesterol: 212 mg/dL — ABNORMAL HIGH (ref 0–200)
HDL: 40.1 mg/dL (ref >39.00)
NONHDL: 171.51
Triglycerides: 269 mg/dL — ABNORMAL HIGH (ref 0.0–149.0)
VLDL: 53.8 mg/dL — AB (ref 0.0–40.0)

## 2015-11-19 LAB — COMPREHENSIVE METABOLIC PANEL
ALK PHOS: 51 U/L (ref 39–117)
ALT: 8 U/L (ref 0–35)
AST: 13 U/L (ref 0–37)
Albumin: 4 g/dL (ref 3.5–5.2)
BUN: 20 mg/dL (ref 6–23)
CO2: 28 meq/L (ref 19–32)
Calcium: 9.2 mg/dL (ref 8.4–10.5)
Chloride: 103 mEq/L (ref 96–112)
Creatinine, Ser: 0.95 mg/dL (ref 0.40–1.20)
GFR: 57.84 mL/min — ABNORMAL LOW (ref >60.00)
GLUCOSE: 89 mg/dL (ref 70–99)
POTASSIUM: 4.4 meq/L (ref 3.5–5.1)
SODIUM: 137 meq/L (ref 135–145)
TOTAL PROTEIN: 6.7 g/dL (ref 6.0–8.3)
Total Bilirubin: 0.3 mg/dL (ref 0.2–1.2)

## 2015-11-19 LAB — CBC WITH DIFFERENTIAL/PLATELET
BASOS ABS: 0 10*3/uL (ref 0.0–0.1)
Basophils Relative: 0.5 % (ref 0.0–3.0)
EOS PCT: 1.8 % (ref 0.0–5.0)
Eosinophils Absolute: 0.1 10*3/uL (ref 0.0–0.7)
HCT: 23.1 % — CL (ref 36.0–46.0)
Hemoglobin: 6.9 g/dL — CL (ref 12.0–15.0)
LYMPHS ABS: 2 10*3/uL (ref 0.7–4.0)
Lymphocytes Relative: 29.4 % (ref 12.0–46.0)
MCHC: 30 g/dL (ref 30.0–36.0)
MCV: 67.9 fl — ABNORMAL LOW (ref 78.0–100.0)
MONO ABS: 0.7 10*3/uL (ref 0.1–1.0)
Monocytes Relative: 10 % (ref 3.0–12.0)
NEUTROS PCT: 58.3 % (ref 43.0–77.0)
Neutro Abs: 3.9 10*3/uL (ref 1.4–7.7)
Platelets: 297 10*3/uL (ref 150.0–400.0)
RBC: 3.4 Mil/uL — AB (ref 3.87–5.11)
RDW: 18.5 % — ABNORMAL HIGH (ref 11.5–15.5)
WBC: 6.7 10*3/uL (ref 4.0–10.5)

## 2015-11-19 LAB — POC URINALSYSI DIPSTICK (AUTOMATED)
BILIRUBIN UA: NEGATIVE
Glucose, UA: NEGATIVE
KETONES UA: NEGATIVE
NITRITE UA: POSITIVE
PH UA: 6
Protein, UA: NEGATIVE
RBC UA: NEGATIVE
SPEC GRAV UA: 1.025
Urobilinogen, UA: 0.2

## 2015-11-19 LAB — VITAMIN D 25 HYDROXY (VIT D DEFICIENCY, FRACTURES): VITD: 36.35 ng/mL (ref 30.00–100.00)

## 2015-11-19 LAB — TSH: TSH: 2.13 u[IU]/mL (ref 0.35–4.50)

## 2015-11-19 NOTE — Patient Instructions (Signed)
[name] , Thank you for taking time to come for your Medicare Wellness Visit. I appreciate your ongoing commitment to your health goals. Please review the following plan we discussed and let me know if I can assist you in the future.   These are the goals we discussed: Goals    . Increase water intake          Starting 11/19/2015, I will continue to drink at least 6-8 glasses of water daily.        This is a list of the screening recommended for you and due dates:  Health Maintenance  Topic Date Due  . Flu Shot  04/09/2020*  . Mammogram  10/12/2020*  . Tetanus Vaccine  04/14/2022  . DEXA scan (bone density measurement)  Completed  . Shingles Vaccine  Addressed  . Pneumonia vaccines  Completed  *Topic was postponed. The date shown is not the original due date.   Preventive Care for Adults  A healthy lifestyle and preventive care can promote health and wellness. Preventive health guidelines for adults include the following key practices.  . A routine yearly physical is a good way to check with your health care provider about your health and preventive screening. It is a chance to share any concerns and updates on your health and to receive a thorough exam.  . Visit your dentist for a routine exam and preventive care every 6 months. Brush your teeth twice a day and floss once a day. Good oral hygiene prevents tooth decay and gum disease.  . The frequency of eye exams is based on your age, health, family medical history, use  of contact lenses, and other factors. Follow your health care provider's ecommendations for frequency of eye exams.  . Eat a healthy diet. Foods like vegetables, fruits, whole grains, low-fat dairy products, and lean protein foods contain the nutrients you need without too many calories. Decrease your intake of foods high in solid fats, added sugars, and salt. Eat the right amount of calories for you. Get information about a proper diet from your health care  provider, if necessary.  . Regular physical exercise is one of the most important things you can do for your health. Most adults should get at least 150 minutes of moderate-intensity exercise (any activity that increases your heart rate and causes you to sweat) each week. In addition, most adults need muscle-strengthening exercises on 2 or more days a week.  Silver Sneakers may be a benefit available to you. To determine eligibility, you may visit the website: www.silversneakers.com or contact program at [phone])[phone] Mon-Fri between 8AM-8PM.   . Maintain a healthy weight. The body mass index (BMI) is a screening tool to identify possible weight problems. It provides an estimate of body fat based on height and weight. Your health care provider can find your BMI and can help you achieve or maintain a healthy weight.   For adults 20 years and older: ? A BMI below 18.5 is considered underweight. ? A BMI of 18.5 to 24.9 is normal. ? A BMI of 25 to 29.9 is considered overweight. ? A BMI of 30 and above is considered obese.   . Maintain normal blood lipids and cholesterol levels by exercising and minimizing your intake of saturated fat. Eat a balanced diet with plenty of fruit and vegetables. Blood tests for lipids and cholesterol should begin at age 80 and be repeated every 5 years. If your lipid or cholesterol levels are high, you are over  50, or you are at high risk for heart disease, you may need your cholesterol levels checked more frequently. Ongoing high lipid and cholesterol levels should be treated with medicines if diet and exercise are not working.  . If you smoke, find out from your health care provider how to quit. If you do not use tobacco, please do not start.  . If you choose to drink alcohol, please do not consume more than 2 drinks per day. One drink is considered to be 12 ounces (355 mL) of beer, 5 ounces (148 mL) of wine, or 1.5 ounces (44 mL) of liquor.  . If you are 46-79 years  old, ask your health care provider if you should take aspirin to prevent strokes.  . Use sunscreen. Apply sunscreen liberally and repeatedly throughout the day. You should seek shade when your shadow is shorter than you. Protect yourself by wearing long sleeves, pants, a wide-brimmed hat, and sunglasses year round, whenever you are outdoors.  . Once a month, do a whole body skin exam, using a mirror to look at the skin on your back. Tell your health care provider of new moles, moles that have irregular borders, moles that are larger than a pencil eraser, or moles that have changed in shape or color.

## 2015-11-19 NOTE — Telephone Encounter (Signed)
Lab order for today

## 2015-11-19 NOTE — Progress Notes (Signed)
Subjective:   Chloe Cooper is a 82 y.o. female who presents for Medicare Annual (Subsequent) preventive examination.  Review of Systems:  N/A Cardiac Risk Factors include: advanced age (>4655men, 61>65 women);hypertension;dyslipidemia;sedentary lifestyle     Objective:     Vitals: BP 118/66 (BP Location: Left Arm, Patient Position: Sitting, Cuff Size: Normal)   Pulse 78   Temp 98.8 F (37.1 C) (Oral)   Ht 5\' 1"  (1.549 m) Comment: no shoes  Wt 146 lb 4 oz (66.3 kg)   SpO2 95%   BMI 27.63 kg/m   Body mass index is 27.63 kg/m.   Tobacco History  Smoking Status  . Never Smoker  Smokeless Tobacco  . Never Used     Counseling given: No   Past Medical History:  Diagnosis Date  . Arthritis    Osteoarthritis hands and knees/?RA  . Baker's cyst   . Cataract   . Degenerative disc disease, lumbar    some spinal stenosis  . Diverticulitis 2009  . Gallstones 2009  . GERD (gastroesophageal reflux disease)   . Hyperlipidemia   . Osteoporosis   . Recurrent UTI   . Shingles   . Wrist fracture, right 2008   Past Surgical History:  Procedure Laterality Date  . APPENDECTOMY    . BREAST SURGERY     cyst removed left breast  . cataracts surg     bil  . CHOLECYSTECTOMY  2012   . PARTIAL HYSTERECTOMY    . TRIGGER FINGER RELEASE     bil hands  . tumor removed     removed from lining of heart   Family History  Problem Relation Age of Onset  . Heart disease Father     CAD  . Heart disease Brother     CAD  . Heart disease Brother     CAD  . Diverticulitis Sister    History  Sexual Activity  . Sexual activity: No    Outpatient Encounter Prescriptions as of 11/19/2015  Medication Sig  . acetaminophen (TYLENOL) 500 MG tablet Take 500 mg by mouth daily.  Marland Kitchen. aspirin 81 MG tablet Take 81 mg by mouth daily.   . Cholecalciferol (VITAMIN D) 2000 UNITS CAPS Take 1 capsule by mouth daily.   . Hypromellose ([employer] GENTLE LUBRICANT EYE DROPS OP) Apply 2 drops to eye 4 (four)  times daily as needed (dry eyes).   . nystatin (MYCOSTATIN) 100000 UNIT/ML suspension Swish and swallow 1 teaspoon three times daily (Patient not taking: Reported on 11/19/2015)   No facility-administered encounter medications on file as of 11/19/2015.     Activities of Daily Living In your present state of health, do you have any difficulty performing the following activities: 11/19/2015  Hearing? Y  Vision? N  Difficulty concentrating or making decisions? Y  Walking or climbing stairs? Y  Dressing or bathing? N  Doing errands, shopping? Y  Preparing Food and eating ? N  Using the Toilet? N  In the past six months, have you accidently leaked urine? Y  Do you have problems with loss of bowel control? N  Managing your Medications? N  Managing your Finances? Y  Housekeeping or managing your Housekeeping? Y  Some recent data might be hidden    Patient Care Team: Judy PimpleMarne A Tower, MD as PCP - General Karie SodaSteven Gross, MD as Consulting Physician (General Surgery) Iva Booparl E Gessner, MD as Consulting Physician (Gastroenterology) Valeria BatmanPeter W Whitfield, MD as Consulting Physician (Orthopedic Surgery) Mckinley JewelSara Stoneburner, MD as Consulting Physician (  Ophthalmology)    Assessment:    Hearing Screening Comments: Wears bilateral hearing aids Vision Screening Comments: Last vision exam in March 2017 with Dr. Dagoberto Ligas  Exercise Activities and Dietary recommendations Current Exercise Habits: The patient does not participate in regular exercise at present, Exercise limited by: None identified  Goals    . Increase water intake          Starting 11/19/2015, I will continue to drink at least 6-8 glasses of water daily.       Fall Risk Fall Risk  11/19/2015 02/23/2013  Falls in the past year? Yes No  Number falls in past yr: 1 -  Injury with Fall? No -  Follow up Falls evaluation completed -   Depression Screen PHQ 2/9 Scores 11/19/2015 02/23/2013  PHQ - 2 Score 0 0     Cognitive Testing MMSE - Mini  Mental State Exam 11/19/2015  Orientation to time 5  Orientation to Place 5  Registration 3  Attention/ Calculation 0  Recall 3  Language- name 2 objects 0  Language- repeat 1  Language- follow 3 step command 2  Language- follow 3 step command-comments pt was unable to recall 1 step of 3 step command  Language- read & follow direction 0  Write a sentence 0  Copy design 0  Total score 19   PLEASE NOTE: A Mini-Cog screen was completed. Maximum score is 20. A value of 0 denotes this part of Folstein MMSE was not completed or the patient failed this part of the Mini-Cog screening.   Mini-Cog Screening Orientation to Time - Max 5 pts Orientation to Place - Max 5 pts Registration - Max 3 pts Recall - Max 3 pts Language Repeat - Max 1 pts Language Follow 3 Step Command - Max 3 pts  Immunization History  Administered Date(s) Administered  . Pneumococcal Conjugate-13 10/30/2014  . Pneumococcal Polysaccharide-23 10/13/2011  . Td 03/09/2002, 04/14/2012   Screening Tests Health Maintenance  Topic Date Due  . INFLUENZA VACCINE  04/09/2020 (Originally 10/07/2015)  . MAMMOGRAM  10/12/2020 (Originally 10/11/1934)  . TETANUS/TDAP  04/14/2022  . DEXA SCAN  Completed  . ZOSTAVAX  Addressed  . PNA vac Low Risk Adult  Completed      Plan:     I have personally reviewed and addressed the Medicare Annual Wellness questionnaire and have noted the following in the patient's chart:  A. Medical and social history B. Use of alcohol, tobacco or illicit drugs  C. Current medications and supplements D. Functional ability and status E.  Nutritional status F.  Physical activity G. Advance directives H. List of other physicians I.  Hospitalizations, surgeries, and ER visits in previous 12 months J.  Vitals K. Screenings to include hearing, vision, cognitive, depression L. Referrals and appointments - none  In addition, I have reviewed and discussed with patient certain preventive protocols,  quality metrics, and best practice recommendations. A written personalized care plan for preventive services as well as general preventive health recommendations were provided to patient.  See attached scanned questionnaire for additional information.   Signed,   Randa Evens, MHA, BS, LPN Health Advisor

## 2015-11-19 NOTE — Progress Notes (Signed)
Pre visit review using our clinic review tool, if applicable. No additional management support is needed unless otherwise documented below in the visit note. 

## 2015-11-19 NOTE — Telephone Encounter (Signed)
[facility] called a critical result, HGB-6.9, HCT-23.1. Results given to Dr Milinda Antisower

## 2015-11-19 NOTE — Progress Notes (Signed)
PCP notes:   Health maintenance:  No gaps identified or addressed.  Abnormal screenings:   Fall risk: hx of accidental fall without injury Mini-Cog score: 19/20  Patient concerns:   Pt wanted urine checked based on hx of recurrent UTIs.  Nurse concerns:  POCT results revealed need for further testing. Urine was obtained for culture.   Next PCP appt:   Pt declined to schedule an annual exam with PCP at this time.   I reviewed health advisor's note, was available for consultation, and agree with documentation and plan.  Roxy MannsMarne Tower MD

## 2015-11-20 NOTE — Telephone Encounter (Signed)
Left message on answering machine at home number to call back. 

## 2015-11-20 NOTE — Telephone Encounter (Signed)
Vicky notified of lab results and Dr. Royden Purlower's comments. F/u appt scheduled

## 2015-11-20 NOTE — Telephone Encounter (Signed)
Please let pt know she is very anemic (this is new)- likely iron deficiency- not sure what is causing it  Please f/u for a visit to discuss this (30 minutes) - we will likely draw a little more blood and disc options for dx and treatment

## 2015-11-20 NOTE — Telephone Encounter (Signed)
Lynden AngVicky - daughter in law- returned call - please call back at [phone]

## 2015-11-21 ENCOUNTER — Telehealth: Payer: Self-pay | Admitting: Family Medicine

## 2015-11-21 NOTE — Telephone Encounter (Signed)
Patient's daughter,Vicky,called to find out the results of patient's urine culture.

## 2015-11-21 NOTE — Telephone Encounter (Signed)
Pt gave me permission to speak with son because Vicky wasn't there. Son notified of Dr. Royden Purlower's comments and verbalized understanding

## 2015-11-21 NOTE — Telephone Encounter (Signed)
The preliminary report is e coli-but the final result with the sensitivities (ie: what it will respond to) are still pending Will make a plan as soon as that returns

## 2015-11-22 LAB — URINE CULTURE

## 2015-11-23 ENCOUNTER — Telehealth: Payer: Self-pay | Admitting: Family Medicine

## 2015-11-23 MED ORDER — CIPROFLOXACIN HCL 250 MG PO TABS
250.0000 mg | ORAL_TABLET | Freq: Two times a day (BID) | ORAL | 0 refills | Status: DC
Start: 2015-11-23 — End: 2016-02-23

## 2015-11-23 NOTE — Telephone Encounter (Signed)
Urine is growing e coli (different from her last uti)  I want to cover it with low dose cipro- this is sensitive to that  Enc water intake  Take med as directed If symptoms let me know  I will [facility] Result to a family member please

## 2015-11-24 NOTE — Telephone Encounter (Signed)
Son notified of Urine cx results and Dr. Royden Purlower's comments. And family has already pick up Rx from pharmacy

## 2015-11-25 ENCOUNTER — Ambulatory Visit (INDEPENDENT_AMBULATORY_CARE_PROVIDER_SITE_OTHER): Payer: Medicare PPO | Admitting: Family Medicine

## 2015-11-25 ENCOUNTER — Encounter: Payer: Self-pay | Admitting: Family Medicine

## 2015-11-25 VITALS — BP 118/56 | HR 76 | Temp 98.0°F | Ht 60.5 in | Wt 146.5 lb

## 2015-11-25 DIAGNOSIS — D649 Anemia, unspecified: Secondary | ICD-10-CM | POA: Insufficient documentation

## 2015-11-25 MED ORDER — POLYSACCHARIDE IRON COMPLEX 150 MG PO CAPS: 150.0000 mg | ORAL_CAPSULE | Freq: Two times a day (BID) | ORAL | 3 refills | Status: DC
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
## 2015-11-25 NOTE — Patient Instructions (Signed)
You are very anemic -likely iron deficient and I do not know why We are going to draw more blood today to check iron levels  We will also give you some cards to put a stool sample on - to bring back - those will test for blood in your stool  Once you have done those- start iron supplement twice daily with food  You can get a stool softener like colace over the counter to keep you from getting constipated from the iron   Once results are back -I will let you know what we need to do next

## 2015-11-25 NOTE — Progress Notes (Signed)
Pre visit review using our clinic review tool, if applicable. No additional management support is needed unless otherwise documented below in the visit note. 

## 2015-11-26 ENCOUNTER — Telehealth: Payer: Self-pay | Admitting: Radiology

## 2015-11-26 LAB — CBC WITH DIFFERENTIAL/PLATELET
BASOS PCT: 2 % (ref 0.0–3.0)
Basophils Absolute: 0.1 10*3/uL (ref 0.0–0.1)
EOS ABS: 0.1 10*3/uL (ref 0.0–0.7)
Eosinophils Relative: 1.9 % (ref 0.0–5.0)
HCT: 23.1 % — CL (ref 36.0–46.0)
Hemoglobin: 6.9 g/dL — CL (ref 12.0–15.0)
LYMPHS ABS: 1.8 10*3/uL (ref 0.7–4.0)
Lymphocytes Relative: 25.9 % (ref 12.0–46.0)
MCHC: 29.9 g/dL — ABNORMAL LOW (ref 30.0–36.0)
MCV: 68.2 fl — ABNORMAL LOW (ref 78.0–100.0)
MONO ABS: 0.6 10*3/uL (ref 0.1–1.0)
Monocytes Relative: 9.2 % (ref 3.0–12.0)
NEUTROS ABS: 4.2 10*3/uL (ref 1.4–7.7)
Neutrophils Relative %: 61 % (ref 43.0–77.0)
PLATELETS: 309 10*3/uL (ref 150.0–400.0)
RBC: 3.39 Mil/uL — ABNORMAL LOW (ref 3.87–5.11)
RDW: 18.5 % — AB (ref 11.5–15.5)
WBC: 6.8 10*3/uL (ref 4.0–10.5)

## 2015-11-26 LAB — FERRITIN: FERRITIN: 6.4 ng/mL — AB (ref 10.0–291.0)

## 2015-11-26 LAB — VITAMIN B12: VITAMIN B 12: 154 pg/mL — AB (ref 211–911)

## 2015-11-26 NOTE — Telephone Encounter (Signed)
[facility] called critical results, HGB - 6.9, HCT 23.1. Results given to Dr Milinda Antisower

## 2015-11-26 NOTE — Assessment & Plan Note (Signed)
New and significant with Hb of 6.9 Low mcv-suspect iron def Also want to r/o other causes  No GI c/o or bleeding and no symptoms overall except for chronic back pain Disc poss of blood loss or poor absorb of iron  Re check cbc with ferritin/iron and B12 today Given 3 heme cards to check stool for blood Nu iron 150 bid px to start after finishing carbs Will likely need ref to GI or heme dep on the results  Explained to pt and her son in detail  >25 minutes spent in face to face time with patient, >50% spent in counselling or coordination of care

## 2015-11-26 NOTE — Telephone Encounter (Signed)
Pt's son notified of lab results and Dr.  Royden Purlower's comments and verbalized understanding. They will bring stool cards back once she has completed them and 1st b12 appt scheduled

## 2015-11-26 NOTE — Telephone Encounter (Signed)
Thanks - her iron is very low and her B12 is also low (these can both add to anemia)  I want her to start getting B12 shots (4 shots weekly for 4 weeks and then one every 6 weeks after that please) Once her stool cards are complete and results in - she can start the iron and we can make a further plan   Please talk to both the patient and a family member about this (since her family does the driving) Thanks

## 2015-12-01 ENCOUNTER — Other Ambulatory Visit: Payer: Medicare PPO

## 2015-12-01 DIAGNOSIS — D649 Anemia, unspecified: Secondary | ICD-10-CM

## 2015-12-01 LAB — HEMOCCULT SLIDES (X 3 CARDS)
FECAL OCCULT BLD: NEGATIVE
OCCULT 1: NEGATIVE
OCCULT 2: NEGATIVE
OCCULT 3: NEGATIVE
OCCULT 4: NEGATIVE
OCCULT 5: NEGATIVE

## 2015-12-01 NOTE — Telephone Encounter (Signed)
I don't have results yet  Will cc to Terri for an update Thanks

## 2015-12-01 NOTE — Telephone Encounter (Signed)
Pt returned you call Checking on her stool samples Best number [phone]

## 2015-12-03 ENCOUNTER — Ambulatory Visit (INDEPENDENT_AMBULATORY_CARE_PROVIDER_SITE_OTHER): Payer: Medicare PPO | Admitting: *Deleted

## 2015-12-03 DIAGNOSIS — E538 Deficiency of other specified B group vitamins: Secondary | ICD-10-CM

## 2015-12-03 MED ORDER — CYANOCOBALAMIN 1000 MCG/ML IJ SOLN
1000.0000 ug | INTRAMUSCULAR | Status: AC
  Administered 2015-12-03 – 2015-12-17 (×3): 1000 ug via INTRAMUSCULAR

## 2015-12-03 NOTE — Progress Notes (Signed)
Order for B12 106800mcg/ml inj once weekly x 4 weeks then  Q6 weeks given by Dr. Milinda Antisower in 11/26/15 phone note.

## 2015-12-10 ENCOUNTER — Ambulatory Visit (INDEPENDENT_AMBULATORY_CARE_PROVIDER_SITE_OTHER): Payer: Medicare PPO | Admitting: *Deleted

## 2015-12-10 DIAGNOSIS — E538 Deficiency of other specified B group vitamins: Secondary | ICD-10-CM

## 2015-12-14 ENCOUNTER — Telehealth: Payer: Self-pay | Admitting: Family Medicine

## 2015-12-14 DIAGNOSIS — D509 Iron deficiency anemia, unspecified: Secondary | ICD-10-CM

## 2015-12-14 NOTE — Telephone Encounter (Signed)
-----   Message from Alvina Chouerri J Walsh sent at 12/10/2015  4:12 PM EDT ----- Regarding: Lab orders for Friday, 10.13.17 Repeat lab orrders

## 2015-12-15 NOTE — Addendum Note (Signed)
Addended by: Alvina ChouWALSH, TERRI J on: 12/15/2015 12:39 PM   Modules accepted: Orders

## 2015-12-17 ENCOUNTER — Ambulatory Visit (INDEPENDENT_AMBULATORY_CARE_PROVIDER_SITE_OTHER): Payer: Medicare PPO | Admitting: *Deleted

## 2015-12-17 DIAGNOSIS — E538 Deficiency of other specified B group vitamins: Secondary | ICD-10-CM

## 2015-12-19 ENCOUNTER — Other Ambulatory Visit (INDEPENDENT_AMBULATORY_CARE_PROVIDER_SITE_OTHER): Payer: Medicare PPO

## 2015-12-19 DIAGNOSIS — D509 Iron deficiency anemia, unspecified: Secondary | ICD-10-CM | POA: Diagnosis not present

## 2015-12-19 LAB — CBC WITH DIFFERENTIAL/PLATELET
BASOS PCT: 0.5 % (ref 0.0–3.0)
Basophils Absolute: 0 10*3/uL (ref 0.0–0.1)
EOS ABS: 0.1 10*3/uL (ref 0.0–0.7)
Eosinophils Relative: 1.4 % (ref 0.0–5.0)
HEMATOCRIT: 26.2 % — AB (ref 36.0–46.0)
LYMPHS PCT: 24.4 % (ref 12.0–46.0)
Lymphs Abs: 1.9 10*3/uL (ref 0.7–4.0)
MCHC: 30.2 g/dL (ref 30.0–36.0)
MCV: 71.1 fl — ABNORMAL LOW (ref 78.0–100.0)
Monocytes Absolute: 0.6 10*3/uL (ref 0.1–1.0)
Monocytes Relative: 7.5 % (ref 3.0–12.0)
Neutro Abs: 5.1 10*3/uL (ref 1.4–7.7)
Neutrophils Relative %: 66.2 % (ref 43.0–77.0)
Platelets: 324 10*3/uL (ref 150.0–400.0)
RBC: 3.68 Mil/uL — AB (ref 3.87–5.11)
RDW: 21.7 % — AB (ref 11.5–15.5)
WBC: 7.8 10*3/uL (ref 4.0–10.5)

## 2015-12-19 LAB — FERRITIN: Ferritin: 6.3 ng/mL — ABNORMAL LOW (ref 10.0–291.0)

## 2015-12-24 ENCOUNTER — Ambulatory Visit (INDEPENDENT_AMBULATORY_CARE_PROVIDER_SITE_OTHER): Payer: Medicare PPO | Admitting: Family Medicine

## 2015-12-24 ENCOUNTER — Ambulatory Visit: Payer: Medicare PPO

## 2015-12-24 ENCOUNTER — Encounter: Payer: Self-pay | Admitting: Family Medicine

## 2015-12-24 VITALS — BP 138/60 | HR 69 | Temp 97.9°F | Ht 60.5 in | Wt 148.2 lb

## 2015-12-24 DIAGNOSIS — D509 Iron deficiency anemia, unspecified: Secondary | ICD-10-CM

## 2015-12-24 DIAGNOSIS — K5903 Drug induced constipation: Secondary | ICD-10-CM

## 2015-12-24 DIAGNOSIS — E538 Deficiency of other specified B group vitamins: Secondary | ICD-10-CM | POA: Insufficient documentation

## 2015-12-24 MED ORDER — CYANOCOBALAMIN 1000 MCG/ML IJ SOLN
1000.0000 ug | Freq: Once | INTRAMUSCULAR | Status: AC
  Administered 2015-12-24: 1000 ug via INTRAMUSCULAR

## 2015-12-24 NOTE — Patient Instructions (Signed)
Your anemia is improving with iron ! I want you to continue your iron twice daily  This is causing some constipation/ stool back up so I want you to take miralax over the counter  Measure one capful and mix with water or other liquid (8 oz at least)- mix it well before you drink it  Do this once per day  It may cause some loose stool-it that gets intolerable, let me know   B12 shot today  Then you will get a B12 shot every month   Please schedule next lab work for 2 months (this is for your cbc/iron and B12 levels)  If any new symptoms let me know please

## 2015-12-24 NOTE — Progress Notes (Signed)
Pre visit review using our clinic review tool, if applicable. No additional management support is needed unless otherwise documented below in the visit note. 
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
## 2015-12-25 NOTE — Assessment & Plan Note (Signed)
Worsened by iron lately which she must continue to take  She has to manually remove stool the last several days  Suspect she may also have a rectocele  Rectal exam is fairly normal  Will begin a regimen of miralax one serving daily-explained how to use Continue good fluid intake  Rev with pt and her son in detail- voiced understanding- will update if no improvement

## 2015-12-25 NOTE — Assessment & Plan Note (Signed)
4th B12 shot today  Then space out to every 4 weeks Check level in 2 mo  Lab Results  Component Value Date   VITAMINB12 154 (L) 11/25/2015

## 2015-12-25 NOTE — Assessment & Plan Note (Addendum)
Improving with iron at this time  Do not know cause but heme stool cards were negative and responding to iron  Pt and her son agree at this time we will tx the anemia and continue to follow w/o an aggressive work up  [employer] to watch for symptoms like abd pain or melena or weakness

## 2016-01-23 ENCOUNTER — Other Ambulatory Visit (INDEPENDENT_AMBULATORY_CARE_PROVIDER_SITE_OTHER): Payer: Medicare PPO

## 2016-01-23 ENCOUNTER — Ambulatory Visit (INDEPENDENT_AMBULATORY_CARE_PROVIDER_SITE_OTHER): Payer: Medicare PPO | Admitting: *Deleted

## 2016-01-23 ENCOUNTER — Telehealth (INDEPENDENT_AMBULATORY_CARE_PROVIDER_SITE_OTHER): Payer: Medicare PPO | Admitting: Family Medicine

## 2016-01-23 ENCOUNTER — Ambulatory Visit: Payer: Medicare PPO | Admitting: Family Medicine

## 2016-01-23 DIAGNOSIS — D509 Iron deficiency anemia, unspecified: Secondary | ICD-10-CM | POA: Diagnosis not present

## 2016-01-23 DIAGNOSIS — E538 Deficiency of other specified B group vitamins: Secondary | ICD-10-CM

## 2016-01-23 LAB — CBC WITH DIFFERENTIAL/PLATELET
BASOS ABS: 0 10*3/uL (ref 0.0–0.1)
Basophils Relative: 0.5 % (ref 0.0–3.0)
Eosinophils Absolute: 0.1 10*3/uL (ref 0.0–0.7)
Eosinophils Relative: 0.9 % (ref 0.0–5.0)
HEMATOCRIT: 31.7 % — AB (ref 36.0–46.0)
Hemoglobin: 10 g/dL — ABNORMAL LOW (ref 12.0–15.0)
LYMPHS PCT: 25.3 % (ref 12.0–46.0)
Lymphs Abs: 1.8 10*3/uL (ref 0.7–4.0)
MCHC: 31.7 g/dL (ref 30.0–36.0)
MCV: 78.2 fl (ref 78.0–100.0)
MONOS PCT: 6.7 % (ref 3.0–12.0)
Monocytes Absolute: 0.5 10*3/uL (ref 0.1–1.0)
NEUTROS PCT: 66.6 % (ref 43.0–77.0)
Neutro Abs: 4.8 10*3/uL (ref 1.4–7.7)
Platelets: 246 10*3/uL (ref 150.0–400.0)
RBC: 4.05 Mil/uL (ref 3.87–5.11)
RDW: 25.3 % — ABNORMAL HIGH (ref 11.5–15.5)
WBC: 7.2 10*3/uL (ref 4.0–10.5)

## 2016-01-23 LAB — FERRITIN: Ferritin: 10.4 ng/mL (ref 10.0–291.0)

## 2016-01-23 LAB — VITAMIN B12: Vitamin B-12: >1500 pg/mL — ABNORMAL HIGH (ref 211–911)

## 2016-01-23 MED ORDER — CYANOCOBALAMIN 1000 MCG/ML IJ SOLN
1000.0000 ug | INTRAMUSCULAR | Status: DC
  Administered 2016-01-23 – 2016-10-12 (×6): 1000 ug via INTRAMUSCULAR

## 2016-01-23 NOTE — Telephone Encounter (Signed)
labs

## 2016-01-23 NOTE — Progress Notes (Signed)
order for b12 inj once weekly x 4 weeks then Q6 weeks given in 11/26/15 phone note.

## 2016-01-26 ENCOUNTER — Encounter: Payer: Self-pay | Admitting: *Deleted

## 2016-02-11 ENCOUNTER — Telehealth: Payer: Self-pay

## 2016-02-11 NOTE — Telephone Encounter (Signed)
Thanks- perhaps punching them all out when she gets them and putting them in a days of the week pill box would be helpful

## 2016-02-11 NOTE — Telephone Encounter (Signed)
Patient's son, Maurine MinisterDennis, called.  [facility] speak with him [facility].  He is concerned as to why pharmacy is stating that it is too early to fill her iron (niferex).  I contacted [facility] and they show patient picked up #60 on 11/18 for a 30 day supply.  Patient's son has no idea what happened to the extra pills but states she does have a hard time punching them out of the foil packing. Son is  unsure that they actually got 60 pills to begin with as he feels strongly that patient has been taking correctly.  He will watch this next month's supply very carefully.  Pharmacy did go ahead and fill refill on file and insurance did cover it.  Cost to patient on $11.00.  Son made aware and will pick up later today.

## 2016-02-16 NOTE — Telephone Encounter (Signed)
I spoke with patient's son Maurine MinisterDennis and he likes the idea of using a pill box and helping her with punching out the pills.  He thoughts maybe they had to stay in the packing so he hadn't tried that yet.    Maurine MinisterDennis will talk to his mom about starting this process.

## 2016-02-23 ENCOUNTER — Ambulatory Visit (INDEPENDENT_AMBULATORY_CARE_PROVIDER_SITE_OTHER): Payer: Medicare PPO | Admitting: Family Medicine

## 2016-02-23 ENCOUNTER — Encounter: Payer: Self-pay | Admitting: Family Medicine

## 2016-02-23 VITALS — BP 132/76 | HR 68 | Temp 98.4°F | Ht 60.5 in | Wt 147.8 lb

## 2016-02-23 DIAGNOSIS — E538 Deficiency of other specified B group vitamins: Secondary | ICD-10-CM

## 2016-02-23 DIAGNOSIS — D509 Iron deficiency anemia, unspecified: Secondary | ICD-10-CM

## 2016-02-23 DIAGNOSIS — K5903 Drug induced constipation: Secondary | ICD-10-CM | POA: Diagnosis not present

## 2016-02-23 DIAGNOSIS — I10 Essential (primary) hypertension: Secondary | ICD-10-CM | POA: Diagnosis not present

## 2016-02-23 MED ORDER — POLYSACCHARIDE IRON COMPLEX 150 MG PO CAPS: 150.0000 mg | ORAL_CAPSULE | Freq: Two times a day (BID) | ORAL | 5 refills | Status: DC

## 2016-02-23 NOTE — Patient Instructions (Addendum)
B12 shot today  Continue iron twice daily  Continue miralax either once daily or every other day (you decide)  Keep up your fluids (water)  Schedule labs in 2 months   Take care of yourself

## 2016-02-23 NOTE — Assessment & Plan Note (Signed)
B12 shot today  Level improved last mo  Will continue every 6-8 weeks Re check in 2 mo (level)and next shot  Doing well

## 2016-02-23 NOTE — Progress Notes (Signed)
Pre visit review using our clinic review tool, if applicable. No additional management support is needed unless otherwise documented below in the visit note. 

## 2016-02-23 NOTE — Assessment & Plan Note (Signed)
Iron def anemia of? Etiology- improving with bid iron  Pt tolerates well as long as we avoid constipation with miralax  Re check cbc and ferritin 2 mo  Not going to aggressively work up - but just treat

## 2016-02-23 NOTE — Assessment & Plan Note (Signed)
bp in fair control at this time  BP Readings from Last 1 Encounters:  02/23/16 132/76   No changes needed- no meds presently for this  Disc lifstyle change with low sodium diet and exercise

## 2016-02-23 NOTE — Assessment & Plan Note (Signed)
Doing well with miralax (to counter act the iron)  Takes it daily  inst that QOD is ok if she gets diarrhea She also keeps up a good water intake
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
## 2016-02-25 ENCOUNTER — Other Ambulatory Visit: Payer: Self-pay | Admitting: Physician Assistant

## 2016-04-27 ENCOUNTER — Ambulatory Visit (INDEPENDENT_AMBULATORY_CARE_PROVIDER_SITE_OTHER): Payer: Medicare PPO | Admitting: *Deleted

## 2016-04-27 ENCOUNTER — Other Ambulatory Visit (INDEPENDENT_AMBULATORY_CARE_PROVIDER_SITE_OTHER): Payer: Medicare PPO

## 2016-04-27 DIAGNOSIS — E538 Deficiency of other specified B group vitamins: Secondary | ICD-10-CM

## 2016-04-27 DIAGNOSIS — D509 Iron deficiency anemia, unspecified: Secondary | ICD-10-CM

## 2016-04-27 LAB — FERRITIN: FERRITIN: 49.8 ng/mL (ref 10.0–291.0)

## 2016-04-27 LAB — CBC WITH DIFFERENTIAL/PLATELET
BASOS ABS: 0 10*3/uL (ref 0.0–0.1)
Basophils Relative: 0.5 % (ref 0.0–3.0)
EOS ABS: 0.1 10*3/uL (ref 0.0–0.7)
Eosinophils Relative: 1.3 % (ref 0.0–5.0)
HCT: 39.7 % (ref 36.0–46.0)
HEMOGLOBIN: 13.5 g/dL (ref 12.0–15.0)
LYMPHS ABS: 2.2 10*3/uL (ref 0.7–4.0)
Lymphocytes Relative: 33.1 % (ref 12.0–46.0)
MCHC: 34 g/dL (ref 30.0–36.0)
MCV: 91.8 fl (ref 78.0–100.0)
MONO ABS: 0.6 10*3/uL (ref 0.1–1.0)
Monocytes Relative: 9.4 % (ref 3.0–12.0)
NEUTROS PCT: 55.7 % (ref 43.0–77.0)
Neutro Abs: 3.7 10*3/uL (ref 1.4–7.7)
Platelets: 210 10*3/uL (ref 150.0–400.0)
RBC: 4.33 Mil/uL (ref 3.87–5.11)
RDW: 15.4 % (ref 11.5–15.5)
WBC: 6.7 10*3/uL (ref 4.0–10.5)

## 2016-04-27 LAB — VITAMIN B12: VITAMIN B 12: 289 pg/mL (ref 211–911)

## 2016-05-25 ENCOUNTER — Encounter: Payer: Self-pay | Admitting: Family Medicine

## 2016-05-25 ENCOUNTER — Ambulatory Visit (INDEPENDENT_AMBULATORY_CARE_PROVIDER_SITE_OTHER): Payer: Medicare PPO | Admitting: Family Medicine

## 2016-05-25 VITALS — BP 124/64 | HR 64 | Temp 98.4°F | Ht 60.5 in | Wt 147.5 lb

## 2016-05-25 DIAGNOSIS — D509 Iron deficiency anemia, unspecified: Secondary | ICD-10-CM

## 2016-05-25 DIAGNOSIS — E538 Deficiency of other specified B group vitamins: Secondary | ICD-10-CM

## 2016-05-25 DIAGNOSIS — K5903 Drug induced constipation: Secondary | ICD-10-CM

## 2016-05-25 DIAGNOSIS — G8929 Other chronic pain: Secondary | ICD-10-CM | POA: Diagnosis not present

## 2016-05-25 DIAGNOSIS — I10 Essential (primary) hypertension: Secondary | ICD-10-CM | POA: Diagnosis not present

## 2016-05-25 DIAGNOSIS — M545 Low back pain, unspecified: Secondary | ICD-10-CM

## 2016-05-25 MED ORDER — POLYSACCHARIDE IRON COMPLEX 150 MG PO CAPS: 150.0000 mg | ORAL_CAPSULE | Freq: Every day | ORAL | 0 refills | Status: DC

## 2016-05-25 NOTE — Progress Notes (Signed)
Pre visit review using our clinic review tool, if applicable. No additional management support is needed unless otherwise documented below in the visit note. 
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
## 2016-05-25 NOTE — Patient Instructions (Addendum)
B12 shot today  Stop at check out to schedule next B12 shot and labs for 1 month  Keep eating a balanced diet  Continue iron once daily   Take care of yourself  Check the directions on your tylenol - to see how often you can take it (? Extended release vs regular)  Take as needed for pain

## 2016-05-26 NOTE — Assessment & Plan Note (Signed)
B12 shot today  Re check level in another month  Continue monthly shots

## 2016-05-26 NOTE — Assessment & Plan Note (Signed)
Chronic back pain  Disc use of tylenol and activity as tolerated

## 2016-05-26 NOTE — Assessment & Plan Note (Signed)
bp in fair control at this time  BP Readings from Last 1 Encounters:  05/25/16 124/64   No changes needed Disc lifstyle change with low sodium diet and exercise

## 2016-05-26 NOTE — Assessment & Plan Note (Signed)
From iron  Improved with decreased dose and use of metamucil Enc good fluid intake

## 2016-05-26 NOTE — Assessment & Plan Note (Signed)
Cbc is much improved Iron was cut to qd from bid  Re check cbc and ferritin in 1 mo  Adj dose accordingly  Not looking for source at pt req -due to age

## 2016-06-28 ENCOUNTER — Other Ambulatory Visit: Payer: Self-pay | Admitting: Family Medicine

## 2016-06-28 DIAGNOSIS — E538 Deficiency of other specified B group vitamins: Secondary | ICD-10-CM

## 2016-06-29 ENCOUNTER — Other Ambulatory Visit (INDEPENDENT_AMBULATORY_CARE_PROVIDER_SITE_OTHER): Payer: Medicare PPO

## 2016-06-29 ENCOUNTER — Ambulatory Visit (INDEPENDENT_AMBULATORY_CARE_PROVIDER_SITE_OTHER): Payer: Medicare PPO | Admitting: *Deleted

## 2016-06-29 DIAGNOSIS — E538 Deficiency of other specified B group vitamins: Secondary | ICD-10-CM

## 2016-06-29 LAB — FERRITIN: Ferritin: 75.9 ng/mL (ref 10.0–291.0)

## 2016-06-29 LAB — VITAMIN B12: VITAMIN B 12: 491 pg/mL (ref 211–911)

## 2016-06-30 ENCOUNTER — Encounter: Payer: Self-pay | Admitting: *Deleted

## 2016-07-01 ENCOUNTER — Other Ambulatory Visit (INDEPENDENT_AMBULATORY_CARE_PROVIDER_SITE_OTHER): Payer: Medicare PPO

## 2016-07-01 DIAGNOSIS — E538 Deficiency of other specified B group vitamins: Secondary | ICD-10-CM | POA: Diagnosis not present

## 2016-07-01 LAB — CBC WITH DIFFERENTIAL/PLATELET
BASOS ABS: 0 10*3/uL (ref 0.0–0.1)
BASOS PCT: 0.6 % (ref 0.0–3.0)
EOS ABS: 0.1 10*3/uL (ref 0.0–0.7)
Eosinophils Relative: 1.7 % (ref 0.0–5.0)
HEMATOCRIT: 38.4 % (ref 36.0–46.0)
HEMOGLOBIN: 13.1 g/dL (ref 12.0–15.0)
LYMPHS PCT: 35.1 % (ref 12.0–46.0)
Lymphs Abs: 2.3 10*3/uL (ref 0.7–4.0)
MCHC: 34.2 g/dL (ref 30.0–36.0)
MCV: 95.2 fl (ref 78.0–100.0)
MONO ABS: 0.6 10*3/uL (ref 0.1–1.0)
Monocytes Relative: 9.6 % (ref 3.0–12.0)
Neutro Abs: 3.5 10*3/uL (ref 1.4–7.7)
Neutrophils Relative %: 53 % (ref 43.0–77.0)
PLATELETS: 236 10*3/uL (ref 150.0–400.0)
RBC: 4.04 Mil/uL (ref 3.87–5.11)
RDW: 13.2 % (ref 11.5–15.5)
WBC: 6.6 10*3/uL (ref 4.0–10.5)

## 2016-07-02 ENCOUNTER — Other Ambulatory Visit: Payer: Medicare PPO

## 2016-07-02 ENCOUNTER — Encounter: Payer: Self-pay | Admitting: *Deleted

## 2016-07-29 ENCOUNTER — Telehealth: Payer: Self-pay | Admitting: Family Medicine

## 2016-07-29 NOTE — Telephone Encounter (Signed)
Caregiver aware will call back to schedule

## 2016-07-29 NOTE — Telephone Encounter (Signed)
Chloe Cooper called stating someone called pt stating she needs to r/s appointment when does she need to r/s too?

## 2016-07-29 NOTE — Telephone Encounter (Signed)
Not sure who was calling [facility] would have AMW in oct and then annual f/u with me  (labs the day of amw) If she does not want to do amw (she may not) -then just oct annual exam with me lab prior

## 2016-08-03 ENCOUNTER — Ambulatory Visit (INDEPENDENT_AMBULATORY_CARE_PROVIDER_SITE_OTHER): Payer: Medicare PPO

## 2016-08-03 DIAGNOSIS — E538 Deficiency of other specified B group vitamins: Secondary | ICD-10-CM

## 2016-08-03 MED ORDER — CYANOCOBALAMIN 1000 MCG/ML IJ SOLN
1000.0000 ug | Freq: Once | INTRAMUSCULAR | Status: AC
  Administered 2016-08-03: 1000 ug via INTRAMUSCULAR

## 2016-09-07 ENCOUNTER — Ambulatory Visit (INDEPENDENT_AMBULATORY_CARE_PROVIDER_SITE_OTHER): Payer: Medicare PPO

## 2016-09-07 DIAGNOSIS — E538 Deficiency of other specified B group vitamins: Secondary | ICD-10-CM

## 2016-09-07 MED ORDER — CYANOCOBALAMIN 1000 MCG/ML IJ SOLN
1000.0000 ug | Freq: Once | INTRAMUSCULAR | Status: AC
  Administered 2016-09-07: 1000 ug via INTRAMUSCULAR

## 2016-10-12 ENCOUNTER — Ambulatory Visit (INDEPENDENT_AMBULATORY_CARE_PROVIDER_SITE_OTHER): Payer: Medicare PPO | Admitting: *Deleted

## 2016-10-12 ENCOUNTER — Other Ambulatory Visit: Payer: Self-pay | Admitting: Physician Assistant

## 2016-10-12 DIAGNOSIS — E538 Deficiency of other specified B group vitamins: Secondary | ICD-10-CM | POA: Diagnosis not present

## 2016-11-17 ENCOUNTER — Ambulatory Visit (INDEPENDENT_AMBULATORY_CARE_PROVIDER_SITE_OTHER): Payer: Medicare PPO

## 2016-11-17 DIAGNOSIS — E538 Deficiency of other specified B group vitamins: Secondary | ICD-10-CM | POA: Diagnosis not present

## 2016-11-17 MED ORDER — CYANOCOBALAMIN 1000 MCG/ML IJ SOLN
1000.0000 ug | Freq: Once | INTRAMUSCULAR | Status: AC
  Administered 2016-11-17: 1000 ug via INTRAMUSCULAR

## 2016-12-01 ENCOUNTER — Encounter: Payer: Self-pay | Admitting: Family Medicine

## 2016-12-01 ENCOUNTER — Ambulatory Visit (INDEPENDENT_AMBULATORY_CARE_PROVIDER_SITE_OTHER): Payer: Medicare PPO | Admitting: Family Medicine

## 2016-12-01 VITALS — BP 112/66 | HR 77 | Temp 98.1°F | Ht 60.5 in | Wt 142.2 lb

## 2016-12-01 DIAGNOSIS — N390 Urinary tract infection, site not specified: Secondary | ICD-10-CM

## 2016-12-01 DIAGNOSIS — I10 Essential (primary) hypertension: Secondary | ICD-10-CM | POA: Diagnosis not present

## 2016-12-01 DIAGNOSIS — Z87440 Personal history of urinary (tract) infections: Secondary | ICD-10-CM

## 2016-12-01 DIAGNOSIS — H6982 Other specified disorders of Eustachian tube, left ear: Secondary | ICD-10-CM | POA: Diagnosis not present

## 2016-12-01 DIAGNOSIS — H6980 Other specified disorders of Eustachian tube, unspecified ear: Secondary | ICD-10-CM | POA: Insufficient documentation

## 2016-12-01 LAB — POC URINALSYSI DIPSTICK (AUTOMATED)
Bilirubin, UA: NEGATIVE
GLUCOSE UA: NEGATIVE
Ketones, UA: NEGATIVE
NITRITE UA: NEGATIVE
Protein, UA: NEGATIVE
RBC UA: NEGATIVE
Spec Grav, UA: 1.02 (ref 1.010–1.025)
UROBILINOGEN UA: 1 U/dL
pH, UA: 6.5 (ref 5.0–8.0)

## 2016-12-01 MED ORDER — FLUTICASONE PROPIONATE 50 MCG/ACT NA SUSP: 1.0000 | Freq: Every day | NASAL | 1 refills | Status: AC
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
## 2016-12-01 NOTE — Patient Instructions (Signed)
Use the flonase nasal spray to help open up your ear  When the ear is better the dizziness should be improved also  You can take your meclizine if you need it for dizziness as well - but use care as it can sedate   We will culture your urine and then let you know if you need an antibiotic Make sure to drink plenty of water   Take care of yourself  If symptoms worsen please alert me

## 2016-12-02 NOTE — Assessment & Plan Note (Signed)
bp in fair control at this time  BP Readings from Last 1 Encounters:  12/01/16 112/66   No changes needed Disc lifstyle change with low sodium diet and exercise

## 2016-12-02 NOTE — Assessment & Plan Note (Signed)
L side with some dizziness  Trial of flonase ns daily for 2-4 wk Watch for ear pain or fever Update if not starting to improve in a week or if worsening

## 2016-12-02 NOTE — Assessment & Plan Note (Signed)
ua is pos for leukocytes  Urinary frequency is acute on chronic  Unsure If colonized ucx sent  tx if positive Enc good water intake

## 2016-12-04 LAB — URINE CULTURE
MICRO NUMBER:: 81067165
SPECIMEN QUALITY:: ADEQUATE

## 2016-12-06 ENCOUNTER — Telehealth: Payer: Self-pay | Admitting: *Deleted

## 2016-12-06 MED ORDER — CEPHALEXIN 250 MG PO CAPS: 250.0000 mg | ORAL_CAPSULE | Freq: Two times a day (BID) | ORAL | 0 refills | Status: DC

## 2016-12-06 NOTE — Telephone Encounter (Signed)
Pt's son notified of Urine cx results and Dr. Royden Purl comments. Rx sent to pharmacy

## 2016-12-06 NOTE — Telephone Encounter (Signed)
-----   Message from Judy Pimple, MD sent at 12/05/2016 11:59 AM EDT ----- Urine is pos for uti  Please call in keflex 250 mg 1 po bid for 7 d #14 no refills Alert if any problems  Try to maintain a good water intake

## 2016-12-11 ENCOUNTER — Other Ambulatory Visit: Payer: Self-pay | Admitting: Family Medicine

## 2016-12-30 ENCOUNTER — Ambulatory Visit: Payer: Medicare PPO

## 2016-12-30 ENCOUNTER — Telehealth: Payer: Self-pay | Admitting: Family Medicine

## 2016-12-30 DIAGNOSIS — I10 Essential (primary) hypertension: Secondary | ICD-10-CM

## 2016-12-30 DIAGNOSIS — E559 Vitamin D deficiency, unspecified: Secondary | ICD-10-CM

## 2016-12-30 DIAGNOSIS — E538 Deficiency of other specified B group vitamins: Secondary | ICD-10-CM

## 2016-12-30 DIAGNOSIS — E7800 Pure hypercholesterolemia, unspecified: Secondary | ICD-10-CM

## 2016-12-30 DIAGNOSIS — D509 Iron deficiency anemia, unspecified: Secondary | ICD-10-CM

## 2016-12-30 NOTE — Telephone Encounter (Signed)
-----   Message from Robert Bellowarlesia R Pinson, LPN sent at 16/10/960410/24/2018  4:28 PM EDT ----- Regarding: Labs 10/25 Lab orders needed. Thank you.  Insurance:  [employer]

## 2017-02-16 ENCOUNTER — Ambulatory Visit: Payer: Medicare PPO | Admitting: Family Medicine

## 2017-03-02 ENCOUNTER — Ambulatory Visit: Payer: Medicare PPO | Admitting: Family Medicine

## 2017-03-02 ENCOUNTER — Encounter: Payer: Self-pay | Admitting: Family Medicine

## 2017-03-02 VITALS — BP 122/64 | HR 73 | Temp 97.7°F | Ht 60.5 in | Wt 138.2 lb

## 2017-03-02 DIAGNOSIS — D509 Iron deficiency anemia, unspecified: Secondary | ICD-10-CM

## 2017-03-02 DIAGNOSIS — E538 Deficiency of other specified B group vitamins: Secondary | ICD-10-CM | POA: Diagnosis not present

## 2017-03-02 DIAGNOSIS — E7800 Pure hypercholesterolemia, unspecified: Secondary | ICD-10-CM | POA: Diagnosis not present

## 2017-03-02 DIAGNOSIS — N390 Urinary tract infection, site not specified: Secondary | ICD-10-CM | POA: Diagnosis not present

## 2017-03-02 DIAGNOSIS — E559 Vitamin D deficiency, unspecified: Secondary | ICD-10-CM

## 2017-03-02 DIAGNOSIS — R8290 Unspecified abnormal findings in urine: Secondary | ICD-10-CM | POA: Diagnosis not present

## 2017-03-02 DIAGNOSIS — I10 Essential (primary) hypertension: Secondary | ICD-10-CM

## 2017-03-02 LAB — CBC WITH DIFFERENTIAL/PLATELET
BASOS ABS: 0 10*3/uL (ref 0.0–0.1)
BASOS PCT: 0.7 % (ref 0.0–3.0)
EOS PCT: 1.5 % (ref 0.0–5.0)
Eosinophils Absolute: 0.1 10*3/uL (ref 0.0–0.7)
HCT: 34.7 % — ABNORMAL LOW (ref 36.0–46.0)
Hemoglobin: 11.2 g/dL — ABNORMAL LOW (ref 12.0–15.0)
LYMPHS PCT: 24.4 % (ref 12.0–46.0)
Lymphs Abs: 1.8 10*3/uL (ref 0.7–4.0)
MCHC: 32.2 g/dL (ref 30.0–36.0)
MCV: 95 fl (ref 78.0–100.0)
MONOS PCT: 10.3 % (ref 3.0–12.0)
Monocytes Absolute: 0.7 10*3/uL (ref 0.1–1.0)
NEUTROS ABS: 4.6 10*3/uL (ref 1.4–7.7)
Neutrophils Relative %: 63.1 % (ref 43.0–77.0)
PLATELETS: 297 10*3/uL (ref 150.0–400.0)
RBC: 3.65 Mil/uL — ABNORMAL LOW (ref 3.87–5.11)
RDW: 13 % (ref 11.5–15.5)
WBC: 7.3 10*3/uL (ref 4.0–10.5)

## 2017-03-02 LAB — COMPREHENSIVE METABOLIC PANEL
ALK PHOS: 71 U/L (ref 39–117)
ALT: 7 U/L (ref 0–35)
AST: 11 U/L (ref 0–37)
Albumin: 4.1 g/dL (ref 3.5–5.2)
BILIRUBIN TOTAL: 0.4 mg/dL (ref 0.2–1.2)
BUN: 27 mg/dL — ABNORMAL HIGH (ref 6–23)
CALCIUM: 9.5 mg/dL (ref 8.4–10.5)
CO2: 29 meq/L (ref 19–32)
Chloride: 102 mEq/L (ref 96–112)
Creatinine, Ser: 1.14 mg/dL (ref 0.40–1.20)
GFR: 46.74 mL/min — AB (ref >60.00)
Glucose, Bld: 93 mg/dL (ref 70–99)
Potassium: 4.5 mEq/L (ref 3.5–5.1)
Sodium: 137 mEq/L (ref 135–145)
Total Protein: 6.9 g/dL (ref 6.0–8.3)

## 2017-03-02 LAB — POC URINALSYSI DIPSTICK (AUTOMATED)
BILIRUBIN UA: NEGATIVE
Glucose, UA: NEGATIVE
Ketones, UA: NEGATIVE
Nitrite, UA: POSITIVE
PH UA: 6 (ref 5.0–8.0)
Protein, UA: NEGATIVE
RBC UA: NEGATIVE
Spec Grav, UA: >=1.03 — AB (ref 1.010–1.025)
Urobilinogen, UA: 0.2 E.U./dL

## 2017-03-02 MED ORDER — CYANOCOBALAMIN 1000 MCG/ML IJ SOLN
1000.0000 ug | Freq: Once | INTRAMUSCULAR | Status: AC
  Administered 2017-03-02: 1000 ug via INTRAMUSCULAR

## 2017-03-02 NOTE — Progress Notes (Signed)
Subjective:    Patient ID: Chloe Cooper, female    DOB: 03/01/1917, 83 y.o.   MRN: [id number]  HPI Here for f/u of chronic medical problems   Had 5 generations of family at her house for xmas  Has been feeling fair  Her back and knees continue to hurt  Cannot stand for very long  Saw ortho - diag with ortho    Wt Readings from Last 3 Encounters:  03/02/17 138 lb 4 oz (62.7 kg)  12/01/16 142 lb 4 oz (64.5 kg)  05/25/16 147 lb 8 oz (66.9 kg)  she has been eating pretty well but smaller portions  26.56 kg/m   bp is stable today  No cp or palpitations or headaches or edema  Controlled with lifestyle /no medication   BP Readings from Last 3 Encounters:  03/02/17 122/64  12/01/16 112/66  05/25/16 124/64   Lab Results  Component Value Date   CREATININE 0.95 11/19/2015   BUN 20 11/19/2015   NA 137 11/19/2015   K 4.4 11/19/2015   CL 103 11/19/2015   CO2 28 11/19/2015   Lab Results  Component Value Date   ALT 8 11/19/2015   AST 13 11/19/2015   ALKPHOS 51 11/19/2015   BILITOT 0.3 11/19/2015       H/o hyperlipidemia  Lab Results  Component Value Date   CHOL 212 (H) 11/19/2015   HDL 40.10 11/19/2015   LDLCALC 90 10/05/2011   LDLDIRECT 134.0 11/19/2015   TRIG 269.0 (H) 11/19/2015   CHOLHDL 5 11/19/2015  used to be on simvastatin  No longer wants to monitor at her age of 83  B12 def  Lab Results  Component Value Date   VITAMINB12 491 06/29/2016   Supposed to get shots monhly  Last was 11/17/16 -overdue  Due for a shot today  In general energy level is low- suspects that is more age related   Anemia  Iron def of ? Cause (declines w/u at her age) Was on ferrex 150 daily and ran out of it - about a week ago  Constipation stays about the same  Lab Results  Component Value Date   WBC 6.6 07/01/2016   HGB 13.1 07/01/2016   HCT 38.4 07/01/2016   MCV 95.2 07/01/2016   PLT 236.0 07/01/2016   Lab Results  Component Value Date   FERRITIN 75.9 06/29/2016     Declines flu shots   Wants her kidneys checked / has frequent urination baseline   Due for D level  Takes her D every day   Urine is dark/occ odor  No dysuria  Tries to drink at least 4 big glasses of water daily     Patient Active Problem List   Diagnosis Date Noted  . B12 deficiency 12/24/2015  . Anemia 11/25/2015  . Burning sensation of mouth 04/22/2015  . Lumbar pain 10/30/2014  . Bilateral knee pain 10/30/2014  . Ingrown toenail 07/17/2014  . Encounter for Medicare annual wellness exam 02/23/2013  . Recurrent UTI 05/25/2011  . Back pain, thoracic 11/11/2010  . Constipation 09/30/2010  . Osteoarthritis 08/21/2010  . Vitamin D deficiency 10/08/2008  . ESSENTIAL HYPERTENSION, BENIGN 10/02/2008  . HYPERCHOLESTEROLEMIA 07/19/2006  . OSTEOPOROSIS 07/19/2006   Past Medical History:  Diagnosis Date  . Arthritis    Osteoarthritis hands and knees/?RA  . Baker's cyst   . Cataract   . Degenerative disc disease, lumbar    some spinal stenosis  . Diverticulitis 2009  . Gallstones 2009  .  GERD (gastroesophageal reflux disease)   . Hyperlipidemia   . Osteoporosis   . Recurrent UTI   . Shingles   . Wrist fracture, right 2008   Past Surgical History:  Procedure Laterality Date  . APPENDECTOMY    . BREAST SURGERY     cyst removed left breast  . cataracts surg     bil  . CHOLECYSTECTOMY  2012   . PARTIAL HYSTERECTOMY    . TRIGGER FINGER RELEASE     bil hands  . tumor removed     removed from lining of heart   Social History   Tobacco Use  . Smoking status: Never Smoker  . Smokeless tobacco: Never Used  Substance Use Topics  . Alcohol use: No    Alcohol/week: 0.0 oz  . Drug use: No   Family History  Problem Relation Age of Onset  . Heart disease Father        CAD  . Heart disease Brother        CAD  . Heart disease Brother        CAD  . Diverticulitis Sister    Allergies  Allergen Reactions  . Alendronate Sodium Swelling    REACTION: GI  .  Penicillins     REACTION: rash  . Simvastatin     Leg pain and cramping   . Sulfonamide Derivatives     REACTION: rash   Current Outpatient Medications on File Prior to Visit  Medication Sig Dispense Refill  . acetaminophen (TYLENOL) 500 MG tablet Take 500 mg by mouth every 8 (eight) hours as needed.    . Cholecalciferol (VITAMIN D3 PO) Take 1 capsule by mouth daily.    . fluticasone (FLONASE) 50 MCG/ACT nasal spray Place 1 spray into both nostrils daily. (Patient taking differently: Place 1 spray into both nostrils daily as needed. ) 16 g 1  . Hypromellose ([employer] GENTLE LUBRICANT EYE DROPS OP) Apply 2 drops to eye 4 (four) times daily as needed (dry eyes).     . iron polysaccharides (FERREX 150) 150 MG capsule Take 1 capsule (150 mg total) by mouth daily. (Patient not taking: Reported on 03/02/2017) 30 capsule 5  . [DISCONTINUED] ranitidine (ZANTAC) 75 MG tablet Take 75 mg by mouth 2 (two) times daily.       No current facility-administered medications on file prior to visit.     Review of Systems  Constitutional: Positive for fatigue. Negative for activity change, appetite change, fever and unexpected weight change.  HENT: Negative for congestion, ear pain, rhinorrhea, sinus pressure and sore throat.   Eyes: Negative for pain, redness and visual disturbance.  Respiratory: Negative for cough, shortness of breath and wheezing.   Cardiovascular: Negative for chest pain and palpitations.  Gastrointestinal: Negative for abdominal pain, blood in stool, constipation and diarrhea.  Endocrine: Negative for polydipsia and polyuria.  Genitourinary: Negative for dysuria, frequency and urgency.  Musculoskeletal: Positive for arthralgias, back pain and gait problem. Negative for joint swelling and myalgias.  Skin: Negative for pallor and rash.  Allergic/Immunologic: Negative for environmental allergies.  Neurological: Negative for dizziness, syncope and headaches.  Hematological: Negative for  adenopathy. Does not bruise/bleed easily.  Psychiatric/Behavioral: Negative for decreased concentration and dysphoric mood. The patient is not nervous/anxious.        Objective:   Physical Exam  Constitutional: She appears well-developed and well-nourished. No distress.  Frail appearing elderly female w/o distress  Sitting in chair for exam   HENT:  Head: Normocephalic and atraumatic.  Mouth/Throat: Oropharynx is clear and moist.  Hard of hearing  Not wearing hearing aides today  Eyes: Conjunctivae and EOM are normal. Pupils are equal, round, and reactive to light.  Neck: Normal range of motion. Neck supple. No JVD present. Carotid bruit is not present. No thyromegaly present.  Cardiovascular: Normal rate, regular rhythm, normal heart sounds and intact distal pulses. Exam reveals no gallop.  Pulmonary/Chest: Effort normal and breath sounds normal. No respiratory distress. She has no wheezes. She has no rales.  No crackles  Abdominal: Soft. Bowel sounds are normal. She exhibits no distension, no abdominal bruit and no mass. There is no tenderness.  Musculoskeletal: She exhibits no edema.  No pedal edema today  Limited rom of knees and LS -baseline Walks with cane  Lymphadenopathy:    She has no cervical adenopathy.  Neurological: She is alert. She has normal reflexes. No cranial nerve deficit. She exhibits normal muscle tone. Coordination normal.  Skin: Skin is warm and dry. No rash noted. No pallor.  Nl color and turgor   Psychiatric: She has a normal mood and affect.  Nl affect  Mentally sharp           Assessment & Plan:   Problem List Items Addressed This Visit      Cardiovascular and Mediastinum   ESSENTIAL HYPERTENSION, BENIGN    No medications at this time Labs - cmet today  Declines lipid check at her age  BP: 122/64  Doing well       Relevant Orders   CBC with Differential/Platelet   Comprehensive metabolic panel     Genitourinary   Recurrent UTI     Pt has frequent urination ua today  States urine is darker than usual- disc need for 64 oz daily         Other   Anemia - Primary    ferrex 150 once daily up until a week ago when she ran out  Hgb was 13.1 in April with ferritin in 70s Labs today for both and then will adv re: iron and whether to refill and start back       Relevant Orders   CBC with Differential/Platelet   Ferritin   B12 deficiency    Level today  Shot today (last was 9/18)= supposed to get monthly No change in energy level       Relevant Orders   Vitamin B12   HYPERCHOLESTEROLEMIA    Diet is fair  Declines further monitor at her age       Vitamin D deficiency    Level today  Continues to take it daily  Disc imp to bone and overall health      Relevant Orders   VITAMIN D 25 Hydroxy (Vit-D Deficiency, Fractures)

## 2017-03-02 NOTE — Assessment & Plan Note (Signed)
ferrex 150 once daily up until a week ago when she ran out  Hgb was 13.1 in April with ferritin in 70s Labs today for both and then will adv re: iron and whether to refill and start back

## 2017-03-02 NOTE — Assessment & Plan Note (Signed)
No medications at this time Labs - cmet today  Declines lipid check at her age  BP: 122/64  Doing well

## 2017-03-02 NOTE — Assessment & Plan Note (Signed)
Pt has frequent urination ua today  States urine is darker than usual- disc need for 64 oz daily

## 2017-03-02 NOTE — Assessment & Plan Note (Signed)
Diet is fair  Declines further monitor at her age

## 2017-03-02 NOTE — Assessment & Plan Note (Signed)
Level today  Continues to take it daily  Disc imp to bone and overall health

## 2017-03-02 NOTE — Assessment & Plan Note (Signed)
Level today  Shot today (last was 9/18)= supposed to get monthly No change in energy level

## 2017-03-02 NOTE — Patient Instructions (Signed)
Try to get at least 64 oz of fluids daily (mostly water)  Labs today  Also urine test today We will call with results Be as active as you can be   I will tell you whether to start iron when I get labs back   B12 shot today   Take care of yourself

## 2017-03-03 ENCOUNTER — Telehealth: Payer: Self-pay | Admitting: *Deleted

## 2017-03-03 LAB — VITAMIN B12: VITAMIN B 12: 345 pg/mL (ref 211–911)

## 2017-03-03 LAB — VITAMIN D 25 HYDROXY (VIT D DEFICIENCY, FRACTURES): VITD: 45.35 ng/mL (ref 30.00–100.00)

## 2017-03-03 LAB — URINE CULTURE
MICRO NUMBER:: 81448439
SPECIMEN QUALITY:: ADEQUATE

## 2017-03-03 LAB — FERRITIN: FERRITIN: 13.7 ng/mL (ref 10.0–291.0)

## 2017-03-03 MED ORDER — POLYSACCHARIDE IRON COMPLEX 150 MG PO CAPS: 150.0000 mg | ORAL_CAPSULE | Freq: Two times a day (BID) | ORAL | 3 refills | Status: DC

## 2017-03-03 NOTE — Telephone Encounter (Signed)
Son notified of lab results and Dr. Royden Purlower's comments. Son declined to schedule f/u appts he said he will check with pt 1st to see when she can come in and then call back to schedule b12 and lab appt., Rx sent to pharmacy

## 2017-03-03 NOTE — Telephone Encounter (Signed)
-----   Message from Judy PimpleMarne A Tower, MD sent at 03/03/2017 11:36 AM EST ----- D level is ok  Iron stores are drifting down again  B12 level was down due to missed shots -but should improve (had shot yesterday)- try to get them once monthly (can schedule next one)  BUN is elevated- do try to drink more water for kidney health and to prevent utis  Blood count is down again (to 11.2 from prev 13.1)  Please re start the ferrex at 1 po bid for 3 mo and re check cbc and ferritin in 3 mo please for iron def anemia  Please send in ferrex 150 mg 1 po bid #[phone] ref (or 3 mo supply if she pref)  Thanks

## 2017-03-04 ENCOUNTER — Encounter: Payer: Self-pay | Admitting: *Deleted

## 2017-03-21 ENCOUNTER — Other Ambulatory Visit: Payer: Self-pay

## 2017-03-21 ENCOUNTER — Emergency Department (HOSPITAL_COMMUNITY)
Admission: EM | Admit: 2017-03-21 | Discharge: 2017-03-21 | Disposition: A | Discharge status: Home / Self Care | Payer: Medicare PPO | Attending: Emergency Medicine | Admitting: Emergency Medicine

## 2017-03-21 ENCOUNTER — Encounter (HOSPITAL_COMMUNITY): Payer: Self-pay | Admitting: Emergency Medicine

## 2017-03-21 DIAGNOSIS — Y33XXXA Other specified events, undetermined intent, initial encounter: Secondary | ICD-10-CM | POA: Insufficient documentation

## 2017-03-21 DIAGNOSIS — Y998 Other external cause status: Secondary | ICD-10-CM | POA: Insufficient documentation

## 2017-03-21 DIAGNOSIS — Y939 Activity, unspecified: Secondary | ICD-10-CM | POA: Diagnosis not present

## 2017-03-21 DIAGNOSIS — Y929 Unspecified place or not applicable: Secondary | ICD-10-CM | POA: Insufficient documentation

## 2017-03-21 DIAGNOSIS — E785 Hyperlipidemia, unspecified: Secondary | ICD-10-CM | POA: Insufficient documentation

## 2017-03-21 DIAGNOSIS — T162XXA Foreign body in left ear, initial encounter: Secondary | ICD-10-CM | POA: Insufficient documentation

## 2017-03-21 MED ORDER — LIDOCAINE VISCOUS 2 % MT SOLN
15.0000 mL | Freq: Once | OROMUCOSAL | Status: AC
Start: 2017-03-21 — End: 2017-03-21
  Administered 2017-03-21: 15 mL via OROMUCOSAL
  Filled 2017-03-21: qty 15

## 2017-03-21 NOTE — ED Provider Notes (Signed)
[facility]-EMERGENCY DEPT Provider Note   CSN: [id number] Arrival date & time: 03/21/17  0113  Time seen 01:30 AM   History   Chief Complaint Chief Complaint  Patient presents with  . Foreign Body in Ear    HPI Chloe Cooper is a 84 y.o. female.  HPI patient reports at 10:30 PM she took her hearing aides out and the rubber tip of the left 1 remained in her ear canal.  Her son tried to get it out and pushed it in further.  She states her left ear hurts and she cannot wait to have it looked at tomorrow.  PCP Tower, Audrie GallusMarne A, MD   Past Medical History:  Diagnosis Date  . Arthritis    Osteoarthritis hands and knees/?RA  . Baker's cyst   . Cataract   . Degenerative disc disease, lumbar    some spinal stenosis  . Diverticulitis 2009  . Gallstones 2009  . GERD (gastroesophageal reflux disease)   . Hyperlipidemia   . Osteoporosis   . Recurrent UTI   . Shingles   . Wrist fracture, right 2008    Patient Active Problem List   Diagnosis Date Noted  . B12 deficiency 12/24/2015  . Anemia 11/25/2015  . Burning sensation of mouth 04/22/2015  . Lumbar pain 10/30/2014  . Bilateral knee pain 10/30/2014  . Ingrown toenail 07/17/2014  . Encounter for Medicare annual wellness exam 02/23/2013  . Recurrent UTI 05/25/2011  . Back pain, thoracic 11/11/2010  . Constipation 09/30/2010  . Osteoarthritis 08/21/2010  . Vitamin D deficiency 10/08/2008  . ESSENTIAL HYPERTENSION, BENIGN 10/02/2008  . HYPERCHOLESTEROLEMIA 07/19/2006  . OSTEOPOROSIS 07/19/2006    Past Surgical History:  Procedure Laterality Date  . APPENDECTOMY    . BREAST SURGERY     cyst removed left breast  . cataracts surg     bil  . CHOLECYSTECTOMY  2012   . PARTIAL HYSTERECTOMY    . TRIGGER FINGER RELEASE     bil hands  . tumor removed     removed from lining of heart    OB History    No data available       Home Medications    Prior to Admission medications   Medication  Sig Start Date End Date Taking? Authorizing Provider  acetaminophen (TYLENOL) 500 MG tablet Take 500 mg by mouth every 8 (eight) hours as needed.    [provider]  Cholecalciferol (VITAMIN D3 PO) Take 1 capsule by mouth daily.    [provider]  fluticasone (FLONASE) 50 MCG/ACT nasal spray Place 1 spray into both nostrils daily. Patient taking differently: Place 1 spray into both nostrils daily as needed.  12/01/16   Tower, Audrie GallusMarne A, MD  Hypromellose ([employer] GENTLE LUBRICANT EYE DROPS OP) Apply 2 drops to eye 4 (four) times daily as needed (dry eyes).     [provider]  iron polysaccharides (FERREX 150) 150 MG capsule Take 1 capsule (150 mg total) by mouth 2 (two) times daily. 03/03/17   Tower, Audrie GallusMarne A, MD    Family History Family History  Problem Relation Age of Onset  . Heart disease Father        CAD  . Heart disease Brother        CAD  . Heart disease Brother        CAD  . Diverticulitis Sister     Social History Social History   Tobacco Use  . Smoking status: Never Smoker  .  Smokeless tobacco: Never Used  Substance Use Topics  . Alcohol use: No    Alcohol/week: 0.0 oz  . Drug use: No  lives at home 3 sons take turns staying with her   Allergies   Alendronate sodium; Penicillins; Simvastatin; and Sulfonamide derivatives   Review of Systems Review of Systems  All other systems reviewed and are negative.    Physical Exam Updated Vital Signs BP 136/63   Pulse 67   Resp 16   SpO2 96%   Physical Exam  Constitutional: She is oriented to person, place, and time. She appears well-developed and well-nourished. No distress.  Pleasant elderly female  HENT:  Head: Normocephalic and atraumatic.  There is a black foreign body down deep in the left ear canal.   Eyes: Conjunctivae and EOM are normal. Pupils are equal, round, and reactive to light.  Cardiovascular: Normal rate.  Pulmonary/Chest: Effort normal. No respiratory distress.    Musculoskeletal: Normal range of motion.  Neurological: She is alert and oriented to person, place, and time. No cranial nerve deficit.  Skin: Skin is warm and dry.  Psychiatric: She has a normal mood and affect. Her behavior is normal. Thought content normal.  Nursing note and vitals reviewed.    ED Treatments / Results  Labs (all labs ordered are listed, but only abnormal results are displayed) Labs Reviewed - No data to display  EKG  EKG Interpretation None       Radiology No results found.  Procedures Procedures (including critical care time)  Medications Ordered in ED Medications  lidocaine (XYLOCAINE) 2 % viscous mouth solution 15 mL (15 mLs Mouth/Throat Given 03/21/17 0147)     Initial Impression / Assessment and Plan / ED Course  I have reviewed the triage vital signs and the nursing notes.  Pertinent labs & imaging results that were available during my care of the patient were reviewed by me and considered in my medical decision making (see chart for details).     Viscous Xylocaine was placed in her left ear canal to numb it and then the ear canal was irrigated with warm water.  Nurses report that they think the foreign body has come out into the mid canal but now she has bleeding.  When I look however the foreign body is still very deep in the ear canal beyond the bend and there is some blood in the ear canal.  It is too deep for me to try to attempt to remove even with alligator forceps.  They will be referred to ENT to be seen tomorrow (later today)  to have this removed.  Final Clinical Impressions(s) / ED Diagnoses   Final diagnoses:  Foreign body of left ear, initial encounter    ED Discharge Orders    None      Plan discharge  Devoria Albe, MD, Concha Pyo, MD 03/21/17 [phone]

## 2017-03-21 NOTE — Discharge Instructions (Signed)
We are unable to remove the rubber tip that is stuck deep into your ear canal. Please call Dr Tona SensingByer's office, the ENT on call, to get an appointment to have the foreign body removed. They should hopefully see you later today. You had some bleeding, you can put a cotton ball in your ear to collect the blood.

## 2017-03-21 NOTE — ED Triage Notes (Signed)
Pt states she has the tip of her hearing aid stuck in her left ear

## 2017-04-19 ENCOUNTER — Ambulatory Visit: Payer: Medicare PPO | Admitting: Family Medicine

## 2017-04-19 ENCOUNTER — Encounter: Payer: Self-pay | Admitting: Family Medicine

## 2017-04-19 VITALS — BP 120/60 | HR 70 | Temp 97.9°F | Wt 137.2 lb

## 2017-04-19 DIAGNOSIS — L304 Erythema intertrigo: Secondary | ICD-10-CM

## 2017-04-19 MED ORDER — CLOTRIMAZOLE 1 % EX CREA: 1.0000 "application " | TOPICAL_CREAM | Freq: Two times a day (BID) | CUTANEOUS | 0 refills | Status: DC

## 2017-04-19 NOTE — Assessment & Plan Note (Signed)
Corn starch has been helpful - seems to be resolving. Skin care reviewed. Rx antifungal. Update if not improving with treatment.  Pt and son agree with plan.

## 2017-04-19 NOTE — Progress Notes (Signed)
BP 120/60 (BP Location: Left Arm, Patient Position: Sitting, Cuff Size: Normal)   Pulse 70   Temp 97.9 F (36.6 C) (Oral)   Wt 137 lb 4 oz (62.3 kg)   SpO2 98%   BMI 26.36 kg/m    CC: rash Subjective:    Patient ID: Chloe Cooper, female    DOB: 09-22-1916, 84 y.o.   MRN: [id number]  HPI: Chloe Cooper is a 85 y.o. female presenting on 04/19/2017 for Rash (Located under bilateral breasts. Started about 1 wk ago. Area is itchy. Has applied corn starch, helpful.)   Here with son.  1 wk h/o itchy rash under bilateral breasts. Raised red patches. Corn starch has been helpful.  Denies new lotions, soaps, shampoos, detergents, medicines or foods.   Relevant past medical, surgical, family and social history reviewed and updated as indicated. Interim medical history since our last visit reviewed. Allergies and medications reviewed and updated. Outpatient Medications Prior to Visit  Medication Sig Dispense Refill  . acetaminophen (TYLENOL) 500 MG tablet Take 500 mg by mouth every 8 (eight) hours as needed.    . Cholecalciferol (VITAMIN D3 PO) Take 1 capsule by mouth daily.    . fluticasone (FLONASE) 50 MCG/ACT nasal spray Place 1 spray into both nostrils daily. (Patient taking differently: Place 1 spray into both nostrils daily as needed. ) 16 g 1  . Hypromellose ([employer] GENTLE LUBRICANT EYE DROPS OP) Apply 2 drops to eye 4 (four) times daily as needed (dry eyes).     . iron polysaccharides (FERREX 150) 150 MG capsule Take 1 capsule (150 mg total) by mouth 2 (two) times daily. 180 capsule 3   No facility-administered medications prior to visit.      Per HPI unless specifically indicated in ROS section below Review of Systems     Objective:    BP 120/60 (BP Location: Left Arm, Patient Position: Sitting, Cuff Size: Normal)   Pulse 70   Temp 97.9 F (36.6 C) (Oral)   Wt 137 lb 4 oz (62.3 kg)   SpO2 98%   BMI 26.36 kg/m   Wt Readings from Last 3 Encounters:  04/19/17 137 lb  4 oz (62.3 kg)  03/02/17 138 lb 4 oz (62.7 kg)  12/01/16 142 lb 4 oz (64.5 kg)    Physical Exam  Constitutional: She appears well-developed and well-nourished. No distress.  Pulmonary/Chest:    Musculoskeletal: She exhibits no edema.  Skin: Skin is warm and dry. Rash noted. There is erythema.  Pink splotchy rash under breasts L>R, pruritic  Nursing note and vitals reviewed.     Assessment & Plan:   Problem List Items Addressed This Visit    Intertrigo - Primary    Corn starch has been helpful - seems to be resolving. Skin care reviewed. Rx antifungal. Update if not improving with treatment.  Pt and son agree with plan.           Meds ordered this encounter  Medications  . clotrimazole (LOTRIMIN AF) 1 % cream    Sig: Apply 1 application topically 2 (two) times daily.    Dispense:  45 g    Refill:  0   No orders of the defined types were placed in this encounter.   Follow up plan: No Follow-up on file.  Chloe Boyden, MD

## 2017-04-19 NOTE — Patient Instructions (Addendum)
I think you have intertrigo - or fungal skin infection below breasts. Treat with antifungal cream sent to pharmacy. Use twice daily on skin. Let us know after 1-2 weeks if not improving with this.   Intertrigo Intertrigo is skin irritation or inflammation (dermatitis) that occurs when folds of skin rub together. The irritation can cause a rash and make skin raw and itchy. This condition most commonly occurs in the skin folds of these areas:  Toes.  Armpits.  Groin.  Belly.  Breasts.  Buttocks.  Intertrigo is not passed from person to person (is not contagious). What are the causes? This condition is caused by heat, moisture, friction, and lack of air circulation. The condition can be made worse by:  Sweat.  Bacteria or a fungus, such as yeast.  What increases the risk? This condition is more likely to occur if you have moisture in your skin folds. It is also more likely to develop in people who:  Have diabetes.  Are overweight.  Are on bed rest.  Live in a warm and moist climate.  Wear splints, braces, or other medical devices.  Are not able to control their bowels or bladder (have incontinence).  What are the signs or symptoms? Symptoms of this condition include:  A pink or red skin rash.  Brown patches on the skin.  Raw or scaly skin.  Itchiness.  A burning feeling.  Bleeding.  Leaking fluid.  A bad smell.  How is this diagnosed? This condition is diagnosed with a medical history and physical exam. You may also have a skin swab to test for bacteria or a fungus, such as yeast. How is this treated? Treatment may include:  Cleaning and drying your skin.  An oral antibiotic medicine or antibiotic skin cream for a bacterial infection.  Antifungal cream or pills for an infection that was caused by a fungus, such as yeast.  Steroid ointment to relieve itchiness and irritation.  Follow these instructions at home:  Keep the affected area clean and  dry.  Do not scratch your skin.  Stay in a cool environment as much as possible. Use an air conditioner or fan, if available.  Apply over-the-counter and prescription medicines only as told by your health care provider.  If you were prescribed an antibiotic medicine, use it as told by your health care provider. Do not stop using the antibiotic even if your condition improves.  Keep all follow-up visits as told by your health care provider. This is important. How is this prevented?  Maintain a healthy weight.  Take care of your feet, especially if you have diabetes. Foot care includes: ? Wearing shoes that fit well. ? Keeping your feet dry. ? Wearing clean, breathable socks.  Protect the skin around your groin and buttocks, especially if you have incontinence. Skin protection includes: ? Following a regular cleaning routine. ? Using moisturizers and skin protectants. ? Changing protection pads frequently.  Do not wear tight clothes. Wear clothes that are loose and absorbent. Wear clothes that are made of cotton.  Wear a bra that gives good support, if needed.  Shower and dry yourself thoroughly after activity. Use a hair dryer on a cool setting to dry between skin folds, especially after you bathe.  If you have diabetes, keep your blood sugar under control. Contact a health care provider if:  Your symptoms do not improve with treatment.  Your symptoms get worse or they spread.  You notice increased redness and warmth.  You have  a fever. This information is not intended to replace advice given to you by your health care provider. Make sure you discuss any questions you have with your health care provider. Document Released: 02/22/2005 Document Revised: 07/31/2015 Document Reviewed: 08/26/2014 Elsevier Interactive Patient Education  2018 [employer]

## 2017-04-26 ENCOUNTER — Other Ambulatory Visit: Payer: Self-pay | Admitting: Physician Assistant

## 2017-07-19 ENCOUNTER — Other Ambulatory Visit: Payer: Self-pay | Admitting: Physician Assistant

## 2017-08-05 ENCOUNTER — Encounter: Payer: Self-pay | Admitting: Internal Medicine

## 2017-08-05 ENCOUNTER — Ambulatory Visit: Payer: Medicare PPO | Admitting: Internal Medicine

## 2017-08-05 VITALS — BP 108/70 | HR 73 | Temp 98.0°F | Ht 60.5 in | Wt 131.0 lb

## 2017-08-05 DIAGNOSIS — M546 Pain in thoracic spine: Secondary | ICD-10-CM | POA: Diagnosis not present

## 2017-08-05 NOTE — Assessment & Plan Note (Signed)
May be same dermatome as past shingles--but with no rash after 10 days---very unlikely No pulmonary symptoms and no pleuritic component--unlikely to be lung related No rib or spine tenderness Probably muscle or other soft tissue Discussed heat and regular acetaminophen Recheck if worry symptoms like cough, fever, etc

## 2017-08-05 NOTE — Patient Instructions (Signed)
Please try heat on the painful area and take the acetaminophen  every 4 hours till the pain is better. Come back in if it gets worse, or you develop cough, fever, etc

## 2017-08-05 NOTE — Progress Notes (Signed)
Subjective:    Patient ID: Chloe Cooper, female    DOB: [birth year], 84 y.o.   MRN: [id number]  HPI Here with son, [name] due to shoulder pain  Actually having pain along right scapula Points to lower lateral area  Goes back 1-2 weeks Son had to encourage visit Pain is all the time No fall or injury Describes as achy now but has told son it is deep stabbing pain  No rash Has tried acetaminophen --eases pain (mostly in AM and prn)  Takes metamucil --then has loose stools and will take imodium Discussed not using the imodium  Current Outpatient Medications on File Prior to Visit  Medication Sig Dispense Refill  . acetaminophen (TYLENOL) 500 MG tablet Take 500 mg by mouth every 8 (eight) hours as needed.    . Cholecalciferol (VITAMIN D3 PO) Take 1 capsule by mouth daily.    . fluticasone (FLONASE) 50 MCG/ACT nasal spray Place 1 spray into both nostrils daily. (Patient taking differently: Place 1 spray into both nostrils daily as needed. ) 16 g 1  . Hypromellose ([employer] GENTLE LUBRICANT EYE DROPS OP) Apply 2 drops to eye 4 (four) times daily as needed (dry eyes).     . iron polysaccharides (FERREX 150) 150 MG capsule Take 1 capsule (150 mg total) by mouth 2 (two) times daily. 180 capsule 3  . [DISCONTINUED] ranitidine (ZANTAC) 75 MG tablet Take 75 mg by mouth 2 (two) times daily.       No current facility-administered medications on file prior to visit.     Allergies  Allergen Reactions  . Alendronate Sodium Swelling    REACTION: GI  . Penicillins     REACTION: rash  . Simvastatin     Leg pain and cramping   . Sulfonamide Derivatives     REACTION: rash    Past Medical History:  Diagnosis Date  . Arthritis    Osteoarthritis hands and knees/?RA  . Baker's cyst   . Cataract   . Degenerative disc disease, lumbar    some spinal stenosis  . Diverticulitis 2009  . Gallstones 2009  . GERD (gastroesophageal reflux disease)   . Hyperlipidemia   . Osteoporosis   .  Recurrent UTI   . Shingles   . Wrist fracture, right 2008    Past Surgical History:  Procedure Laterality Date  . APPENDECTOMY    . BREAST SURGERY     cyst removed left breast  . cataracts surg     bil  . CHOLECYSTECTOMY  2012   . PARTIAL HYSTERECTOMY    . TRIGGER FINGER RELEASE     bil hands  . tumor removed     removed from lining of heart    Family History  Problem Relation Age of Onset  . Heart disease Father        CAD  . Heart disease Brother        CAD  . Heart disease Brother        CAD  . Diverticulitis Sister     Social History   Socioeconomic History  . Marital status: Married    Spouse name: Not on file  . Number of children: Not on file  . Years of education: Not on file  . Highest education level: Not on file  Occupational History  . Occupation: Retired   Engineer, production  . Financial resource strain: Not on file  . Food insecurity:    Worry: Not on file    Inability:  Not on file  . Transportation needs:    Medical: Not on file    Non-medical: Not on file  Tobacco Use  . Smoking status: Never Smoker  . Smokeless tobacco: Never Used  Substance and Sexual Activity  . Alcohol use: No    Alcohol/week: 0.0 oz  . Drug use: No  . Sexual activity: Never  Lifestyle  . Physical activity:    Days per week: Not on file    Minutes per session: Not on file  . Stress: Not on file  Relationships  . Social connections:    Talks on phone: Not on file    Gets together: Not on file    Attends religious service: Not on file    Active member of club or organization: Not on file    Attends meetings of clubs or organizations: Not on file    Relationship status: Not on file  . Intimate partner violence:    Fear of current or ex partner: Not on file    Emotionally abused: Not on file    Physically abused: Not on file    Forced sexual activity: Not on file  Other Topics Concern  . Not on file  Social History Narrative   Coffee and [employer] daily    Review  of Systems No cough or SOB No fever Did have shingles under breast many years ago    Objective:   Physical Exam  Constitutional: She appears well-developed. No distress.  Neck: No thyromegaly present.  Cardiovascular: Normal rate, regular rhythm and normal heart sounds. Exam reveals no gallop.  No murmur heard. Respiratory: Effort normal and breath sounds normal. No respiratory distress. She has no wheezes. She has no rales.  No dullness Pain area is along ~T9-10 on right  Musculoskeletal:  Normal ROM of right shoulder  Lymphadenopathy:    She has no cervical adenopathy.  Skin:  No rash on back in pain area           Assessment & Plan:

## 2017-08-26 ENCOUNTER — Ambulatory Visit: Payer: Medicare PPO | Admitting: Family Medicine

## 2017-08-26 ENCOUNTER — Encounter: Payer: Self-pay | Admitting: Family Medicine

## 2017-08-26 VITALS — BP 118/60 | HR 86 | Temp 98.3°F | Ht 60.5 in | Wt 129.5 lb

## 2017-08-26 DIAGNOSIS — N3000 Acute cystitis without hematuria: Secondary | ICD-10-CM | POA: Diagnosis not present

## 2017-08-26 DIAGNOSIS — R1030 Lower abdominal pain, unspecified: Secondary | ICD-10-CM

## 2017-08-26 DIAGNOSIS — E538 Deficiency of other specified B group vitamins: Secondary | ICD-10-CM

## 2017-08-26 LAB — POC URINALSYSI DIPSTICK (AUTOMATED)
Blood, UA: NEGATIVE
Glucose, UA: NEGATIVE
NITRITE UA: POSITIVE
Protein, UA: POSITIVE — AB
Spec Grav, UA: >=1.03 — AB (ref 1.010–1.025)
Urobilinogen, UA: 0.2 E.U./dL
pH, UA: 6 (ref 5.0–8.0)

## 2017-08-26 MED ORDER — CYANOCOBALAMIN 1000 MCG/ML IJ SOLN
1000.0000 ug | Freq: Once | INTRAMUSCULAR | Status: AC
  Administered 2017-08-26: 1000 ug via INTRAMUSCULAR

## 2017-08-26 MED ORDER — CEPHALEXIN 500 MG PO CAPS: 500.0000 mg | ORAL_CAPSULE | Freq: Two times a day (BID) | ORAL | 0 refills | Status: DC

## 2017-08-26 NOTE — Patient Instructions (Addendum)
b12 shot today - schedule a monthly nurse visit.  I think you may have urine infection - treat with keflex twice daily.  Urine culture sent in.  Urinary Tract Infection, Adult A urinary tract infection (UTI) is an infection of any part of the urinary tract. The urinary tract includes the:  Kidneys.  Ureters.  Bladder.  Urethra.  These organs make, store, and get rid of pee (urine) in the body. Follow these instructions at home:  Take over-the-counter and prescription medicines only as told by your doctor.  If you were prescribed an antibiotic medicine, take it as told by your doctor. Do not stop taking the antibiotic even if you start to feel better.  Avoid the following drinks: ? Alcohol. ? Caffeine. ? Tea. ? Carbonated drinks.  Drink enough fluid to keep your pee clear or pale yellow.  Keep all follow-up visits as told by your doctor. This is important.  Make sure to: ? Empty your bladder often and completely. Do not to hold pee for long periods of time. ? Empty your bladder before and after sex. ? Wipe from front to back after a bowel movement if you are female. Use each tissue one time when you wipe. Contact a doctor if:  You have back pain.  You have a fever.  You feel sick to your stomach (nauseous).  You throw up (vomit).  Your symptoms do not get better after 3 days.  Your symptoms go away and then come back. Get help right away if:  You have very bad back pain.  You have very bad lower belly (abdominal) pain.  You are throwing up and cannot keep down any medicines or water. This information is not intended to replace advice given to you by your health care provider. Make sure you discuss any questions you have with your health care provider. Document Released: 08/11/2007 Document Revised: 07/31/2015 Document Reviewed: 01/13/2015 Elsevier Interactive Patient Education  [employer]2018 [employer] Inc.

## 2017-08-26 NOTE — Assessment & Plan Note (Addendum)
UA and story consistent with UTI. Start keflex 7d course BID. UCx sent. Update if not improving with treatment. No recent UTI.

## 2017-08-26 NOTE — Progress Notes (Signed)
BP 118/60 (BP Location: Left Arm, Patient Position: Sitting, Cuff Size: Normal)   Pulse 86   Temp 98.3 F (36.8 C) (Oral)   Ht 5' 0.5" (1.537 m)   Wt 129 lb 8 oz (58.7 kg)   SpO2 96%   BMI 24.87 kg/m    CC: UTI Subjective:    Patient ID: Lowella Bandy, female    DOB: 02-21-1917, 84 y.o.   MRN: [id number]  HPI: KIMIYE BARMAN is a 84 y.o. female presenting on 08/26/2017 for Nocturia (States she had to use restroom a lot last night. Pt accompanied by son, Kathlene November.); Abdominal Pain (C/o lower abd pain. Started about 3 days ago. ); and B12 Injection (States she stopped getting vit B12 inj. )   3d h/o increased urinary frequency, urgency, nocturia, bad odor. Some lower abd pain. No dysuria, hematuria, fevers/chills, nausea/vomiting. No flank pain.   Last UTI was late last year.   Requests B12 injection.   Relevant past medical, surgical, family and social history reviewed and updated as indicated. Interim medical history since our last visit reviewed. Allergies and medications reviewed and updated. Outpatient Medications Prior to Visit  Medication Sig Dispense Refill  . acetaminophen (TYLENOL) 500 MG tablet Take 500 mg by mouth every 8 (eight) hours as needed.    . Cholecalciferol (VITAMIN D3 PO) Take 1 capsule by mouth daily.    . fluticasone (FLONASE) 50 MCG/ACT nasal spray Place 1 spray into both nostrils daily. (Patient taking differently: Place 1 spray into both nostrils daily as needed. ) 16 g 1  . Hypromellose ([employer] GENTLE LUBRICANT EYE DROPS OP) Apply 2 drops to eye 4 (four) times daily as needed (dry eyes).     . iron polysaccharides (FERREX 150) 150 MG capsule Take 1 capsule (150 mg total) by mouth 2 (two) times daily. 180 capsule 3  . ranitidine (ZANTAC) 75 MG tablet Take 75 mg by mouth 2 (two) times daily. As needed     No facility-administered medications prior to visit.      Per HPI unless specifically indicated in ROS section below Review of Systems       Objective:    BP 118/60 (BP Location: Left Arm, Patient Position: Sitting, Cuff Size: Normal)   Pulse 86   Temp 98.3 F (36.8 C) (Oral)   Ht 5' 0.5" (1.537 m)   Wt 129 lb 8 oz (58.7 kg)   SpO2 96%   BMI 24.87 kg/m   Wt Readings from Last 3 Encounters:  08/26/17 129 lb 8 oz (58.7 kg)  08/05/17 131 lb (59.4 kg)  04/19/17 137 lb 4 oz (62.3 kg)    Physical Exam  Constitutional: She appears well-developed and well-nourished. She does not appear ill.  HENT:  Mouth/Throat: Oropharynx is clear and moist. No oropharyngeal exudate.  Cardiovascular: Normal rate and normal heart sounds.  No murmur heard. Pulmonary/Chest: Effort normal and breath sounds normal. No respiratory distress. She has no wheezes. She has no rhonchi. She has no rales.  Abdominal: Soft. Normal appearance, normal aorta and bowel sounds are normal. There is no hepatosplenomegaly. There is no tenderness. There is no rebound, no CVA tenderness and negative Murphy's sign.  Nursing note and vitals reviewed.  Results for orders placed or performed in visit on 08/26/17  POCT Urinalysis Dipstick (Automated)  Result Value Ref Range   Color, UA yellow    Clarity, UA cloudy    Glucose, UA Negative Negative   Bilirubin, UA 1+    Ketones,  UA trace    Spec Grav, UA >=1.030 (A) 1.010 - 1.025   Blood, UA negative    pH, UA 6.0 5.0 - 8.0   Protein, UA Positive (A) Negative   Urobilinogen, UA 0.2 0.2 or 1.0 E.U./dL   Nitrite, UA positive    Leukocytes, UA Large (3+) (A) Negative   Lab Results  Component Value Date   CREATININE 1.14 03/02/2017       Assessment & Plan:   Problem List Items Addressed This Visit    B12 deficiency    b12 shot today.  Encouraged they schedule monthly shots.      Acute cystitis without hematuria - Primary    UA and story consistent with UTI. Start keflex 7d course BID. UCx sent. Update if not improving with treatment. No recent UTI.       Relevant Orders   Urine Culture    Other Visit  Diagnoses    Lower abdominal pain       Relevant Orders   POCT Urinalysis Dipstick (Automated) (Completed)       Meds ordered this encounter  Medications  . cephALEXin (KEFLEX) 500 MG capsule    Sig: Take 1 capsule (500 mg total) by mouth 2 (two) times daily.    Dispense:  14 capsule    Refill:  0   Orders Placed This Encounter  Procedures  . Urine Culture  . POCT Urinalysis Dipstick (Automated)    Follow up plan: Return if symptoms worsen or fail to improve.  Eustaquio Boyden, MD

## 2017-08-26 NOTE — Assessment & Plan Note (Addendum)
b12 shot today.  Encouraged they schedule monthly shots.

## 2017-08-26 NOTE — Addendum Note (Signed)
Addended by: Nanci PinaGOINS, Breionna Punt on: 08/26/2017 11:04 AM   Modules accepted: Orders

## 2017-08-28 LAB — URINE CULTURE
MICRO NUMBER:: 90745393
SPECIMEN QUALITY: ADEQUATE

## 2017-09-07 ENCOUNTER — Other Ambulatory Visit: Payer: Self-pay | Admitting: Physician Assistant

## 2017-09-23 ENCOUNTER — Encounter: Payer: Self-pay | Admitting: Family Medicine

## 2017-09-23 ENCOUNTER — Ambulatory Visit: Payer: Medicare PPO | Admitting: Family Medicine

## 2017-09-23 VITALS — BP 120/64 | HR 74 | Temp 98.2°F | Ht 60.5 in | Wt 129.0 lb

## 2017-09-23 DIAGNOSIS — R21 Rash and other nonspecific skin eruption: Secondary | ICD-10-CM | POA: Diagnosis not present

## 2017-09-23 MED ORDER — TRIAMCINOLONE ACETONIDE 0.1 % EX CREA: 1.0000 "application " | TOPICAL_CREAM | Freq: Two times a day (BID) | CUTANEOUS | 0 refills | Status: DC

## 2017-09-23 NOTE — Progress Notes (Signed)
BP 120/64 (BP Location: Left Arm, Patient Position: Sitting, Cuff Size: Normal)   Pulse 74   Temp 98.2 F (36.8 C) (Oral)   Ht 5' 0.5" (1.537 m)   Wt 129 lb (58.5 kg)   SpO2 95%   BMI 24.78 kg/m    CC: rash  Subjective:    Patient ID: Chloe Cooper, female    DOB: 1917/01/17, 84 y.o.   MRN: [id number]  HPI: Chloe Cooper is a 84 y.o. female presenting on 09/23/2017 for Rash (Noticed rash on left side of neck 2 wks ago. Area itches. Pt has applied Vaseline and hydrogen peroxide. Pt is accompanied by her son. )   2 wk h/o rash L side of neck - red itchy, stinging and burning.   No new lotions, detergents, soaps or shampoos.  No new medicines or foods.   Treating with vaseline and peroxide  Relevant past medical, surgical, family and social history reviewed and updated as indicated. Interim medical history since our last visit reviewed. Allergies and medications reviewed and updated. Outpatient Medications Prior to Visit  Medication Sig Dispense Refill  . acetaminophen (TYLENOL) 500 MG tablet Take 500 mg by mouth every 8 (eight) hours as needed.    . Cholecalciferol (VITAMIN D3 PO) Take 1 capsule by mouth daily.    . fluticasone (FLONASE) 50 MCG/ACT nasal spray Place 1 spray into both nostrils daily. (Patient taking differently: Place 1 spray into both nostrils daily as needed. ) 16 g 1  . Hypromellose ([employer]S GENTLE LUBRICANT EYE DROPS OP) Apply 2 drops to eye 4 (four) times daily as needed (dry eyes).     . iron polysaccharides (FERREX 150) 150 MG capsule Take 1 capsule (150 mg total) by mouth 2 (two) times daily. 180 capsule 3  . ranitidine (ZANTAC) 75 MG tablet Take 75 mg by mouth 2 (two) times daily. As needed    . cephALEXin (KEFLEX) 500 MG capsule Take 1 capsule (500 mg total) by mouth 2 (two) times daily. 14 capsule 0   No facility-administered medications prior to visit.      Per HPI unless specifically indicated in ROS section below Review of Systems       Objective:    BP 120/64 (BP Location: Left Arm, Patient Position: Sitting, Cuff Size: Normal)   Pulse 74   Temp 98.2 F (36.8 C) (Oral)   Ht 5' 0.5" (1.537 m)   Wt 129 lb (58.5 kg)   SpO2 95%   BMI 24.78 kg/m   Wt Readings from Last 3 Encounters:  09/23/17 129 lb (58.5 kg)  08/26/17 129 lb 8 oz (58.7 kg)  08/05/17 131 lb (59.4 kg)    Physical Exam  Constitutional: She appears well-developed and well-nourished. No distress.  Skin: Rash noted. There is erythema.  Raised erythematous patch L lateral neck that is pruritic and tender No vesicles or papules appreciated  Nursing note and vitals reviewed.     Assessment & Plan:   Problem List Items Addressed This Visit    Skin rash - Primary    Anticipate dermatitis of unclear etiology.  Not consistent with shingles.  Treat with TCI steroid cream for max 2 weeks, may continue vaseline PRN.           Meds ordered this encounter  Medications  . triamcinolone cream (KENALOG) 0.1 %    Sig: Apply 1 application topically 2 (two) times daily. Apply to AA.    Dispense:  45 g    Refill:  0   No orders of the defined types were placed in this encounter.   Follow up plan: No follow-ups on file.  Eustaquio Boyden, MD

## 2017-09-23 NOTE — Assessment & Plan Note (Signed)
Anticipate dermatitis of unclear etiology.  Not consistent with shingles.  Treat with TCI steroid cream for max 2 weeks, may continue vaseline PRN.

## 2017-09-23 NOTE — Patient Instructions (Addendum)
I think you have contact dermatitis - treat with steroid cream sent to pharmacy. May use twice daily for max 2 weeks.   Rash A rash is a change in the color of the skin. A rash can also change the way your skin feels. There are many different conditions and factors that can cause a rash. Follow these instructions at home: Pay attention to any changes in your symptoms. Follow these instructions to help with your condition: Medicine Take or apply over-the-counter and prescription medicines only as told by your health care provider. These may include:  Corticosteroid cream.  Anti-itch lotions.  Oral antihistamines.  Skin Care  Apply cool compresses to the affected areas.  Try taking a bath with: ? Epsom salts. Follow the instructions on the packaging. You can get these at your local pharmacy or grocery store. ? Baking soda. Pour a small amount into the bath as told by your health care provider. ? Colloidal oatmeal. Follow the instructions on the packaging. You can get this at your local pharmacy or grocery store.  Try applying baking soda paste to your skin. Stir water into baking soda until it reaches a paste-like consistency.  Do not scratch or rub your skin.  Avoid covering the rash. Make sure the rash is exposed to air as much as possible. General instructions  Avoid hot showers or baths, which can make itching worse. A cold shower may help.  Avoid scented soaps, detergents, and perfumes. Use gentle soaps, detergents, perfumes, and other cosmetic products.  Avoid any substance that causes your rash. Keep a journal to help track what causes your rash. Write down: ? What you eat. ? What cosmetic products you use. ? What you drink. ? What you wear. This includes jewelry.  Keep all follow-up visits as told by your health care provider. This is important. Contact a health care provider if:  You sweat at night.  You lose weight.  You urinate more than normal.  You feel  weak.  You vomit.  Your skin or the whites of your eyes look yellow (jaundice).  Your skin: ? Tingles. ? Is numb.  Your rash: ? Does not go away after several days. ? Gets worse.  You are: ? Unusually thirsty. ? More tired than normal.  You have: ? New symptoms. ? Pain in your abdomen. ? A fever. ? Diarrhea. Get help right away if:  You develop a rash that covers all or most of your body. The rash may or may not be painful.  You develop blisters that: ? Are on top of the rash. ? Grow larger or grow together. ? Are painful. ? Are inside your nose or mouth.  You develop a rash that: ? Looks like purple pinprick-sized spots all over your body. ? Has a "bull's eye" or looks like a target. ? Is not related to sun exposure, is red and painful, and causes your skin to peel. This information is not intended to replace advice given to you by your health care provider. Make sure you discuss any questions you have with your health care provider. Document Released: 02/12/2002 Document Revised: 07/29/2015 Document Reviewed: 07/10/2014 Elsevier Interactive Patient Education  2018 [employer]

## 2017-09-28 ENCOUNTER — Ambulatory Visit (INDEPENDENT_AMBULATORY_CARE_PROVIDER_SITE_OTHER): Payer: Medicare PPO

## 2017-09-28 DIAGNOSIS — E538 Deficiency of other specified B group vitamins: Secondary | ICD-10-CM

## 2017-09-28 MED ORDER — CYANOCOBALAMIN 1000 MCG/ML IJ SOLN
1000.0000 ug | Freq: Once | INTRAMUSCULAR | Status: AC
  Administered 2017-09-28: 1000 ug via INTRAMUSCULAR

## 2017-09-28 NOTE — Progress Notes (Signed)
Per orders of Dr. Tower, injection of Vitamin B12 given by Zyir Gassert C Benjamin Casanas, RN. Patient tolerated injection well.  

## 2017-11-02 ENCOUNTER — Ambulatory Visit: Payer: Medicare PPO | Admitting: Family Medicine

## 2017-11-02 ENCOUNTER — Encounter: Payer: Self-pay | Admitting: Family Medicine

## 2017-11-02 ENCOUNTER — Ambulatory Visit (INDEPENDENT_AMBULATORY_CARE_PROVIDER_SITE_OTHER): Payer: Medicare PPO

## 2017-11-02 VITALS — BP 98/56 | HR 80 | Temp 97.7°F | Ht 60.5 in | Wt 124.0 lb

## 2017-11-02 DIAGNOSIS — R350 Frequency of micturition: Secondary | ICD-10-CM

## 2017-11-02 DIAGNOSIS — E538 Deficiency of other specified B group vitamins: Secondary | ICD-10-CM | POA: Diagnosis not present

## 2017-11-02 DIAGNOSIS — N3090 Cystitis, unspecified without hematuria: Secondary | ICD-10-CM

## 2017-11-02 LAB — POC URINALSYSI DIPSTICK (AUTOMATED)
Blood, UA: NEGATIVE
GLUCOSE UA: NEGATIVE
Ketones, UA: 1
Nitrite, UA: POSITIVE
PROTEIN UA: NEGATIVE
Spec Grav, UA: >=1.03 — AB (ref 1.010–1.025)
UROBILINOGEN UA: 0.2 U/dL
pH, UA: 5 (ref 5.0–8.0)

## 2017-11-02 MED ORDER — CYANOCOBALAMIN 1000 MCG/ML IJ SOLN
1000.0000 ug | Freq: Once | INTRAMUSCULAR | Status: AC
  Administered 2017-11-02: 1000 ug via INTRAMUSCULAR

## 2017-11-02 MED ORDER — CEPHALEXIN 500 MG PO CAPS: 500.0000 mg | ORAL_CAPSULE | Freq: Two times a day (BID) | ORAL | 0 refills | Status: DC

## 2017-11-02 NOTE — Progress Notes (Signed)
Subjective:    Patient ID: Chloe Cooper, female    DOB: 05/08/1916, 84 y.o.   MRN: [id number]  HPI This is a 84 yo female, accompanied by her son, who presents today with 2 days of urinary frequency. Had some diarrhea about 4 days ago. [facility] [place] [employer] within short period of time. Resolved with immodium, no abdominal pain, chronic midline low back pain. No fever. No nausea or vomiting. No dysuria. Feeling well other than urinary frequency. No recent falls.  Had cystitis 2 months ago, E.coli, resolved with keflex.   Past Medical History:  Diagnosis Date  . Arthritis    Osteoarthritis hands and knees/?RA  . Baker's cyst   . Cataract   . Degenerative disc disease, lumbar    some spinal stenosis  . Diverticulitis 2009  . Gallstones 2009  . GERD (gastroesophageal reflux disease)   . Hyperlipidemia   . Osteoporosis   . Recurrent UTI   . Shingles   . Wrist fracture, right 2008   Past Surgical History:  Procedure Laterality Date  . APPENDECTOMY    . BREAST SURGERY     cyst removed left breast  . cataracts surg     bil  . CHOLECYSTECTOMY  2012   . PARTIAL HYSTERECTOMY    . TRIGGER FINGER RELEASE     bil hands  . tumor removed     removed from lining of heart   Family History  Problem Relation Age of Onset  . Heart disease Father        CAD  . Heart disease Brother        CAD  . Heart disease Brother        CAD  . Diverticulitis Sister    Social History   Tobacco Use  . Smoking status: Never Smoker  . Smokeless tobacco: Never Used  Substance Use Topics  . Alcohol use: No    Alcohol/week: 0.0 standard drinks  . Drug use: No      Review of Systems Per HPI    Objective:   Physical Exam Physical Exam  Constitutional: She is oriented to person, place, and time. She appears well-developed and well-nourished. No distress. Ambulating with cane and holding on to son. HENT:  Head: Normocephalic and atraumatic.  Cardiovascular: Normal rate,  regular rhythm and normal heart sounds.   Pulmonary/Chest: Effort normal and breath sounds normal.  Abdominal: Soft. She exhibits no distension. There is no tenderness. There is no rebound, no guarding and no CVA tenderness.  Neurological: She is alert and oriented to person, place, and time.  Skin: Skin is warm and dry. She is not diaphoretic.  Psychiatric: She has a normal mood and affect. Her behavior is normal. Judgment and thought content normal.  Vitals reviewed.  BP (!) 98/56 (BP Location: Left Arm, Patient Position: Sitting, Cuff Size: Normal)   Pulse 80   Temp 97.7 F (36.5 C) (Oral)   Ht 5' 0.5" (1.537 m)   Wt 124 lb (56.2 kg)   SpO2 98%   BMI 23.82 kg/m  BP Readings from Last 3 Encounters:  11/02/17 (!) 98/56  09/23/17 120/64  08/26/17 118/60   Wt Readings from Last 3 Encounters:  11/02/17 124 lb (56.2 kg)  09/23/17 129 lb (58.5 kg)  08/26/17 129 lb 8 oz (58.7 kg)   Results for orders placed or performed in visit on 11/02/17  POCT Urinalysis Dipstick (Automated)  Result Value Ref Range   Color, UA Yellow  Clarity, UA dark    Glucose, UA Negative Negative   Bilirubin, UA 1+    Ketones, UA 1    Spec Grav, UA >=1.030 (A) 1.010 - 1.025   Blood, UA Neg    pH, UA 5.0 5.0 - 8.0   Protein, UA Negative Negative   Urobilinogen, UA 0.2 0.2 or 1.0 E.U./dL   Nitrite, UA pos    Leukocytes, UA Small (1+) (A) Negative       Assessment & Plan:  1. Frequent urination - POCT Urinalysis Dipstick (Automated) - Urine Culture  2. Cystitis - Provided written and verbal information regarding diagnosis and treatment. - RTC precautions reviewed - encouraged increased fluids - cephALEXin (KEFLEX) 500 MG capsule; Take 1 capsule (500 mg total) by mouth 2 (two) times daily.  Dispense: 14 capsule; Refill: 0   Chloe Ree, FNP-BC  [facility], [facility]  11/02/2017 3:41 PM

## 2017-11-02 NOTE — Progress Notes (Signed)
B12 Injection given in Left Deltoid today. Pt tolerated well.

## 2017-11-02 NOTE — Patient Instructions (Signed)
Good to see you today  Increase your water intake- drink enough to make your urine light yellow  I have sent in an antibiotic to your pharmacy and will call you with your urine culture results   Urinary Tract Infection, Adult A urinary tract infection (UTI) is an infection of any part of the urinary tract. The urinary tract includes the:  Kidneys.  Ureters.  Bladder.  Urethra.  These organs make, store, and get rid of pee (urine) in the body. Follow these instructions at home:  Take over-the-counter and prescription medicines only as told by your doctor.  If you were prescribed an antibiotic medicine, take it as told by your doctor. Do not stop taking the antibiotic even if you start to feel better.  Avoid the following drinks: ? Alcohol. ? Caffeine. ? Tea. ? Carbonated drinks.  Drink enough fluid to keep your pee clear or pale yellow.  Keep all follow-up visits as told by your doctor. This is important.  Make sure to: ? Empty your bladder often and completely. Do not to hold pee for long periods of time. ? Empty your bladder before and after sex. ? Wipe from front to back after a bowel movement if you are female. Use each tissue one time when you wipe. Contact a doctor if:  You have back pain.  You have a fever.  You feel sick to your stomach (nauseous).  You throw up (vomit).  Your symptoms do not get better after 3 days.  Your symptoms go away and then come back. Get help right away if:  You have very bad back pain.  You have very bad lower belly (abdominal) pain.  You are throwing up and cannot keep down any medicines or water. This information is not intended to replace advice given to you by your health care provider. Make sure you discuss any questions you have with your health care provider. Document Released: 08/11/2007 Document Revised: 07/31/2015 Document Reviewed: 01/13/2015 Elsevier Interactive Patient Education  [employer]2018 [employer]

## 2017-11-04 LAB — URINE CULTURE
MICRO NUMBER: 91029883
SPECIMEN QUALITY: ADEQUATE

## 2017-12-07 ENCOUNTER — Ambulatory Visit (INDEPENDENT_AMBULATORY_CARE_PROVIDER_SITE_OTHER): Payer: Medicare PPO

## 2017-12-07 DIAGNOSIS — E538 Deficiency of other specified B group vitamins: Secondary | ICD-10-CM

## 2017-12-07 MED ORDER — CYANOCOBALAMIN 1000 MCG/ML IJ SOLN
1000.0000 ug | Freq: Once | INTRAMUSCULAR | Status: AC
  Administered 2017-12-07: 1000 ug via INTRAMUSCULAR

## 2017-12-07 NOTE — Progress Notes (Signed)
Pt given 1000mcg/ml in Right Deltoid. Pt tolerated well. 

## 2018-01-11 ENCOUNTER — Ambulatory Visit (INDEPENDENT_AMBULATORY_CARE_PROVIDER_SITE_OTHER): Payer: Medicare PPO | Admitting: *Deleted

## 2018-01-11 DIAGNOSIS — E538 Deficiency of other specified B group vitamins: Secondary | ICD-10-CM | POA: Diagnosis not present

## 2018-01-11 MED ORDER — CYANOCOBALAMIN 1000 MCG/ML IJ SOLN
1000.0000 ug | Freq: Once | INTRAMUSCULAR | Status: AC
  Administered 2018-01-11: 1000 ug via INTRAMUSCULAR

## 2018-01-11 NOTE — Progress Notes (Addendum)
Per orders of Dr. Milinda Antis injection of Vitamin B12 given by Ileana Ladd. Patient tolerated injection well.  I reviewed order, was available for consultation, and agree with documentation and plan.

## 2018-02-13 ENCOUNTER — Ambulatory Visit (INDEPENDENT_AMBULATORY_CARE_PROVIDER_SITE_OTHER)
Admission: RE | Admit: 2018-02-13 | Discharge: 2018-02-13 | Disposition: A | Discharge status: Home / Self Care | Payer: Medicare PPO | Source: Ambulatory Visit | Attending: Family Medicine | Admitting: Family Medicine

## 2018-02-13 ENCOUNTER — Ambulatory Visit (INDEPENDENT_AMBULATORY_CARE_PROVIDER_SITE_OTHER): Payer: Medicare PPO | Admitting: Family Medicine

## 2018-02-13 ENCOUNTER — Encounter: Payer: Self-pay | Admitting: Family Medicine

## 2018-02-13 VITALS — BP 108/60 | HR 81 | Temp 97.5°F | Ht 60.5 in | Wt 123.8 lb

## 2018-02-13 DIAGNOSIS — I10 Essential (primary) hypertension: Secondary | ICD-10-CM

## 2018-02-13 DIAGNOSIS — R058 Other specified cough: Secondary | ICD-10-CM

## 2018-02-13 DIAGNOSIS — E559 Vitamin D deficiency, unspecified: Secondary | ICD-10-CM

## 2018-02-13 DIAGNOSIS — D509 Iron deficiency anemia, unspecified: Secondary | ICD-10-CM | POA: Diagnosis not present

## 2018-02-13 DIAGNOSIS — M546 Pain in thoracic spine: Secondary | ICD-10-CM

## 2018-02-13 DIAGNOSIS — M810 Age-related osteoporosis without current pathological fracture: Secondary | ICD-10-CM | POA: Diagnosis not present

## 2018-02-13 DIAGNOSIS — R05 Cough: Secondary | ICD-10-CM

## 2018-02-13 DIAGNOSIS — R51 Headache: Secondary | ICD-10-CM | POA: Diagnosis not present

## 2018-02-13 DIAGNOSIS — G8929 Other chronic pain: Secondary | ICD-10-CM | POA: Diagnosis not present

## 2018-02-13 DIAGNOSIS — E538 Deficiency of other specified B group vitamins: Secondary | ICD-10-CM | POA: Diagnosis not present

## 2018-02-13 DIAGNOSIS — N390 Urinary tract infection, site not specified: Secondary | ICD-10-CM

## 2018-02-13 DIAGNOSIS — R109 Unspecified abdominal pain: Secondary | ICD-10-CM | POA: Diagnosis not present

## 2018-02-13 DIAGNOSIS — R519 Headache, unspecified: Secondary | ICD-10-CM

## 2018-02-13 DIAGNOSIS — R1032 Left lower quadrant pain: Secondary | ICD-10-CM | POA: Diagnosis not present

## 2018-02-13 LAB — SEDIMENTATION RATE: Sed Rate: 39 mm/hr — ABNORMAL HIGH (ref 0–30)

## 2018-02-13 LAB — POC URINALSYSI DIPSTICK (AUTOMATED)
Bilirubin, UA: 1
Blood, UA: NEGATIVE
Glucose, UA: NEGATIVE
LEUKOCYTES UA: NEGATIVE
Nitrite, UA: POSITIVE
PH UA: 5.5 (ref 5.0–8.0)
Protein, UA: POSITIVE — AB
Urobilinogen, UA: 1 E.U./dL

## 2018-02-13 LAB — CBC WITH DIFFERENTIAL/PLATELET
Basophils Absolute: 0 10*3/uL (ref 0.0–0.1)
Basophils Relative: 0.5 % (ref 0.0–3.0)
EOS PCT: 0.8 % (ref 0.0–5.0)
Eosinophils Absolute: 0.1 10*3/uL (ref 0.0–0.7)
HEMATOCRIT: 35.1 % — AB (ref 36.0–46.0)
Hemoglobin: 11.6 g/dL — ABNORMAL LOW (ref 12.0–15.0)
Lymphocytes Relative: 19.9 % (ref 12.0–46.0)
Lymphs Abs: 1.6 10*3/uL (ref 0.7–4.0)
MCHC: 32.9 g/dL (ref 30.0–36.0)
MCV: 96.7 fl (ref 78.0–100.0)
Monocytes Absolute: 0.7 10*3/uL (ref 0.1–1.0)
Monocytes Relative: 9.4 % (ref 3.0–12.0)
Neutro Abs: 5.4 10*3/uL (ref 1.4–7.7)
Neutrophils Relative %: 69.4 % (ref 43.0–77.0)
Platelets: 328 10*3/uL (ref 150.0–400.0)
RBC: 3.64 Mil/uL — ABNORMAL LOW (ref 3.87–5.11)
RDW: 13.8 % (ref 11.5–15.5)
WBC: 7.8 10*3/uL (ref 4.0–10.5)

## 2018-02-13 LAB — LIPID PANEL
CHOLESTEROL: 219 mg/dL — AB (ref 0–200)
HDL: 47.7 mg/dL (ref >39.00)
NonHDL: 171.25
TRIGLYCERIDES: 253 mg/dL — AB (ref 0.0–149.0)
Total CHOL/HDL Ratio: 5
VLDL: 50.6 mg/dL — ABNORMAL HIGH (ref 0.0–40.0)

## 2018-02-13 LAB — COMPREHENSIVE METABOLIC PANEL
ALT: 5 U/L (ref 0–35)
AST: 10 U/L (ref 0–37)
Albumin: 4 g/dL (ref 3.5–5.2)
Alkaline Phosphatase: 88 U/L (ref 39–117)
BUN: 19 mg/dL (ref 6–23)
CALCIUM: 10 mg/dL (ref 8.4–10.5)
CO2: 28 mEq/L (ref 19–32)
Chloride: 100 mEq/L (ref 96–112)
Creatinine, Ser: 0.91 mg/dL (ref 0.40–1.20)
GFR: 60.5 mL/min (ref >60.00)
Glucose, Bld: 129 mg/dL — ABNORMAL HIGH (ref 70–99)
Potassium: 3.8 mEq/L (ref 3.5–5.1)
Sodium: 136 mEq/L (ref 135–145)
Total Bilirubin: 0.3 mg/dL (ref 0.2–1.2)
Total Protein: 7 g/dL (ref 6.0–8.3)

## 2018-02-13 LAB — VITAMIN D 25 HYDROXY (VIT D DEFICIENCY, FRACTURES): VITD: 46.3 ng/mL (ref 30.00–100.00)

## 2018-02-13 LAB — LDL CHOLESTEROL, DIRECT: Direct LDL: 140 mg/dL

## 2018-02-13 LAB — VITAMIN B12: Vitamin B-12: 764 pg/mL (ref 211–911)

## 2018-02-13 LAB — FERRITIN: Ferritin: 37.5 ng/mL (ref 10.0–291.0)

## 2018-02-13 LAB — TSH: TSH: 1.54 u[IU]/mL (ref 0.35–4.50)

## 2018-02-13 MED ORDER — CEPHALEXIN 250 MG PO CAPS: 250.0000 mg | ORAL_CAPSULE | Freq: Two times a day (BID) | ORAL | 0 refills | Status: DC

## 2018-02-13 NOTE — Assessment & Plan Note (Signed)
Vit D level today  Declines further eval or tx Some TS pain -xray ordered

## 2018-02-13 NOTE — Assessment & Plan Note (Signed)
Acute on chronic with long hx of headaches More in temple area lately  No tenderness over TA area (pt also does not have a headache) Takes tylenol- ? Analgesic rebound  Sed rate with labs today

## 2018-02-13 NOTE — Progress Notes (Signed)
Subjective:    Patient ID: Chloe Cooper, female    DOB: 05/08/1916, 84 y.o.   MRN: [id number]  HPI Here for c/o of back and flank pain  Also dizziness and headache   Wt Readings from Last 3 Encounters:  02/13/18 123 lb 12 oz (56.1 kg)  11/02/17 124 lb (56.2 kg)  09/23/17 129 lb (58.5 kg)  lost 6 lb since July  23.77 kg/m   No falls or trauma lately  Upper lumbar area -points to  5 years or more   Urine  Results for orders placed or performed in visit on 02/13/18  POCT Urinalysis Dipstick (Automated)  Result Value Ref Range   Color, UA Dark Yellow    Clarity, UA Clear    Glucose, UA Negative Negative   Bilirubin, UA 1 mg/dL    Ketones, UA 5mg /dL    Spec Grav, UA >=4.010>=1.030 (A) 1.010 - 1.025   Blood, UA Negative    pH, UA 5.5 5.0 - 8.0   Protein, UA Positive (A) Negative   Urobilinogen, UA 1.0 0.2 or 1.0 E.U./dL   Nitrite, UA Positive    Leukocytes, UA Negative Negative   no burning with urination  No more frequency than usual  Has not felt feverish  No nausea    Some times has loose stool  On and off   Gets her B12 shots every month  Wonders if she has kidney stones - has LLQ and flank pain before she has a bm    Some prod cough - ? What color phlegm is  No sob   Some headache  On and off -for the past month  In temples  No vision changes No sens to light or sound  Throbbing pain -not severe  Some dizziness with her head ache    H/o OP-declines dexa  H/o recurrent uti  H/o chronic low back pain -with known deg disc dz   bp is stable today  No cp or palpitations or headaches or edema  No medications currently  BP Readings from Last 3 Encounters:  02/13/18 108/60  11/02/17 (!) 98/56  09/23/17 120/64      Patient Active Problem List   Diagnosis Date Noted  . Productive cough 02/13/2018  . Abdominal pain, left lower quadrant 02/13/2018  . Acute cystitis without hematuria 08/26/2017  . Intertrigo 04/19/2017  . B12 deficiency 12/24/2015  .  Anemia 11/25/2015  . Burning sensation of mouth 04/22/2015  . Lumbar pain 10/30/2014  . Bilateral knee pain 10/30/2014  . Ingrown toenail 07/17/2014  . Encounter for Medicare annual wellness exam 02/23/2013  . Headache 06/09/2012  . Recurrent UTI 05/25/2011  . Back pain, thoracic 11/11/2010  . Thoracic back pain 11/07/2010  . Constipation 09/30/2010  . Osteoarthritis 08/21/2010  . Skin rash 05/21/2010  . Vitamin D deficiency 10/08/2008  . ESSENTIAL HYPERTENSION, BENIGN 10/02/2008  . HYPERCHOLESTEROLEMIA 07/19/2006  . Osteoporosis 07/19/2006   Past Medical History:  Diagnosis Date  . Arthritis    Osteoarthritis hands and knees/?RA  . Baker's cyst   . Cataract   . Degenerative disc disease, lumbar    some spinal stenosis  . Diverticulitis 2009  . Gallstones 2009  . GERD (gastroesophageal reflux disease)   . Hyperlipidemia   . Osteoporosis   . Recurrent UTI   . Shingles   . Wrist fracture, right 2008   Past Surgical History:  Procedure Laterality Date  . APPENDECTOMY    . BREAST SURGERY  cyst removed left breast  . cataracts surg     bil  . CHOLECYSTECTOMY  2012   . PARTIAL HYSTERECTOMY    . TRIGGER FINGER RELEASE     bil hands  . tumor removed     removed from lining of heart   Social History   Tobacco Use  . Smoking status: Never Smoker  . Smokeless tobacco: Never Used  Substance Use Topics  . Alcohol use: No    Alcohol/week: 0.0 standard drinks  . Drug use: No   Family History  Problem Relation Age of Onset  . Heart disease Father        CAD  . Heart disease Brother        CAD  . Heart disease Brother        CAD  . Diverticulitis Sister    Allergies  Allergen Reactions  . Alendronate Sodium Swelling    REACTION: GI  . Penicillins     REACTION: rash  . Simvastatin     Leg pain and cramping   . Sulfonamide Derivatives     REACTION: rash   Current Outpatient Medications on File Prior to Visit  Medication Sig Dispense Refill  .  acetaminophen (TYLENOL) 500 MG tablet Take 500 mg by mouth every 8 (eight) hours as needed.    . Cholecalciferol (VITAMIN D3 PO) Take 1 capsule by mouth daily.    . fluticasone (FLONASE) 50 MCG/ACT nasal spray Place 1 spray into both nostrils daily. (Patient taking differently: Place 1 spray into both nostrils daily as needed. ) 16 g 1  . Hypromellose ([employer] GENTLE LUBRICANT EYE DROPS OP) Apply 2 drops to eye 4 (four) times daily as needed (dry eyes).     . iron polysaccharides (FERREX 150) 150 MG capsule Take 1 capsule (150 mg total) by mouth 2 (two) times daily. 180 capsule 3  . ranitidine (ZANTAC) 75 MG tablet Take 75 mg by mouth 2 (two) times daily. As needed     No current facility-administered medications on file prior to visit.     Review of Systems  Constitutional: Positive for activity change and fatigue. Negative for appetite change, fever and unexpected weight change.  HENT: Positive for postnasal drip and rhinorrhea. Negative for congestion, ear pain, sinus pressure, sore throat and voice change.   Eyes: Negative for pain, redness and visual disturbance.  Respiratory: Positive for cough. Negative for choking, chest tightness, shortness of breath, wheezing and stridor.   Cardiovascular: Negative for chest pain, palpitations and leg swelling.  Gastrointestinal: Positive for abdominal pain. Negative for abdominal distention, anal bleeding, blood in stool, constipation, diarrhea, nausea, rectal pain and vomiting.  Endocrine: Negative for polydipsia and polyuria.  Genitourinary: Positive for flank pain. Negative for dysuria, frequency, hematuria, urgency, vaginal bleeding and vaginal discharge.  Musculoskeletal: Positive for arthralgias, back pain and gait problem. Negative for joint swelling and myalgias.  Skin: Negative for pallor and rash.  Allergic/Immunologic: Negative for environmental allergies.  Neurological: Positive for dizziness and headaches. Negative for tremors, seizures,  syncope, facial asymmetry, speech difficulty, weakness, light-headedness and numbness.  Hematological: Negative for adenopathy. Does not bruise/bleed easily.  Psychiatric/Behavioral: Negative for decreased concentration and dysphoric mood. The patient is not nervous/anxious.        Objective:   Physical Exam  Constitutional: She is oriented to person, place, and time. She appears well-developed and well-nourished. No distress.  Well appearing elderly female in no distress  HENT:  Head: Normocephalic and atraumatic.  Right Ear: External  ear normal.  Left Ear: External ear normal.  Nose: Nose normal.  Mouth/Throat: Oropharynx is clear and moist. No oropharyngeal exudate.  No sinus tenderness No temporal tenderness  No TMJ tendernes  Wearing hearing aides  Eyes: Pupils are equal, round, and reactive to light. Conjunctivae and EOM are normal. Right eye exhibits no discharge. Left eye exhibits no discharge. No scleral icterus.  No nystagmus  Neck: Normal range of motion and full passive range of motion without pain. Neck supple. No JVD present. Carotid bruit is not present. No tracheal deviation present. No thyromegaly present.  Cardiovascular: Normal rate, regular rhythm, normal heart sounds and intact distal pulses. Exam reveals no gallop and no friction rub.  No murmur heard. Pulmonary/Chest: Effort normal and breath sounds normal. No stridor. No respiratory distress. She has no wheezes. She has no rales. She exhibits no tenderness.  Abdominal: Soft. Bowel sounds are normal. She exhibits no distension and no mass. There is tenderness in the suprapubic area. There is no rigidity, no rebound, no guarding and no CVA tenderness. No hernia.  She does not have abdominal pain today  Mild suprapubic tenderness w/o fullness No rebound or guarding    Musculoskeletal: She exhibits no edema or deformity.       Thoracic back: She exhibits decreased range of motion, tenderness and bony tenderness.  She exhibits no edema, no deformity, no spasm and normal pulse.  Mild kyphosis   Tender at lower TS midline/bony No muscular tenderness Nl rom hips Neg SLR  Lymphadenopathy:    She has no cervical adenopathy.  Neurological: She is alert and oriented to person, place, and time. She has normal strength and normal reflexes. She displays no atrophy, no tremor and normal reflexes. No cranial nerve deficit or sensory deficit. She exhibits normal muscle tone. She displays a negative Romberg sign. Coordination and gait normal.  No focal cerebellar signs   Skin: Skin is warm and dry. No rash noted. No erythema. No pallor.  sks and lentigines diffusely  Psychiatric: She has a normal mood and affect. Her behavior is normal. Thought content normal.  Pleasant           Assessment & Plan:   Problem List Items Addressed This Visit      Cardiovascular and Mediastinum   ESSENTIAL HYPERTENSION, BENIGN    bp in fair control at this time  BP Readings from Last 1 Encounters:  02/13/18 108/60   No changes needed Most recent labs reviewed  Disc lifstyle change with low sodium diet and exercise  Labs ordered today      Relevant Orders   CBC with Differential/Platelet (Completed)   Comprehensive metabolic panel (Completed)   Lipid panel (Completed)   TSH (Completed)     Musculoskeletal and Integument   Osteoporosis    Vit D level today  Declines further eval or tx Some TS pain -xray ordered       Relevant Orders   VITAMIN D 25 Hydroxy (Vit-D Deficiency, Fractures) (Completed)     Genitourinary   Recurrent UTI    UA today with nitrites and protein  Also high SG Long discussion re: hydration  Also cover with keflex for 5 d -pend urine culture      Relevant Medications   cephALEXin (KEFLEX) 250 MG capsule     Other   Vitamin D deficiency    With OP  Takes otc D daily  Level today       Thoracic back pain - Primary  Acute on chronic  Pain in lower TS  Will check xray in  light of her osteoporosis  She has bony tenderness       Relevant Orders   DG Thoracic Spine W/Swimmers   DG Chest 2 View   Productive cough    Per family  cxr today      Relevant Orders   DG Chest 2 View   Headache    Acute on chronic with long hx of headaches More in temple area lately  No tenderness over TA area (pt also does not have a headache) Takes tylenol- ? Analgesic rebound  Sed rate with labs today       Relevant Orders   Sedimentation Rate (Completed)   B12 deficiency    Getting monthly injections B12 level today  Some fatigue       Relevant Orders   Vitamin B12 (Completed)   Anemia    Lab today She continues to take iron       Relevant Orders   Ferritin (Completed)   Abdominal pain, left lower quadrant    Intermittent in conjunction with occ diarrhea Not clear if she gets constipation Hx of renal stones in the past- no blood in urine today KUB ordered       Relevant Orders   DG Abd 1 View    Other Visit Diagnoses    Flank pain       Relevant Orders   POCT Urinalysis Dipstick (Automated) (Completed)   Urine Culture

## 2018-02-13 NOTE — Assessment & Plan Note (Signed)
bp in fair control at this time  BP Readings from Last 1 Encounters:  02/13/18 108/60   No changes needed Most recent labs reviewed  Disc lifstyle change with low sodium diet and exercise  Labs ordered today

## 2018-02-13 NOTE — Assessment & Plan Note (Addendum)
Lab today She continues to take iron

## 2018-02-13 NOTE — Assessment & Plan Note (Signed)
Acute on chronic  Pain in lower TS  Will check xray in light of her osteoporosis  She has bony tenderness

## 2018-02-13 NOTE — Patient Instructions (Addendum)
Please aim to get 64 oz of fluids (mostly water) every day  Your urine is concentrated - indicating you may be dehydrated -this can cause headache and urinary problems   We will culture urine  Take the keflex for uti as directed   Xrays today    Labs today

## 2018-02-13 NOTE — Assessment & Plan Note (Signed)
With OP  Takes otc D daily  Level today

## 2018-02-13 NOTE — Assessment & Plan Note (Signed)
Intermittent in conjunction with occ diarrhea Not clear if she gets constipation Hx of renal stones in the past- no blood in urine today KUB ordered

## 2018-02-13 NOTE — Assessment & Plan Note (Signed)
Per family  cxr today

## 2018-02-13 NOTE — Assessment & Plan Note (Signed)
Getting monthly injections B12 level today  Some fatigue

## 2018-02-13 NOTE — Assessment & Plan Note (Signed)
UA today with nitrites and protein  Also high SG Long discussion re: hydration  Also cover with keflex for 5 d -pend urine culture

## 2018-02-15 LAB — URINE CULTURE
MICRO NUMBER:: 91471045
SPECIMEN QUALITY:: ADEQUATE

## 2018-02-23 ENCOUNTER — Other Ambulatory Visit: Payer: Self-pay

## 2018-02-23 ENCOUNTER — Inpatient Hospital Stay (HOSPITAL_COMMUNITY)
Admission: EM | Admit: 2018-02-23 | Discharge: 2018-02-28 | DRG: 470 | Disposition: A | Discharge status: Skilled Nursing Facility | Payer: Medicare PPO | Attending: Internal Medicine | Admitting: Internal Medicine

## 2018-02-23 ENCOUNTER — Emergency Department (HOSPITAL_COMMUNITY): Payer: Medicare PPO

## 2018-02-23 ENCOUNTER — Inpatient Hospital Stay (HOSPITAL_COMMUNITY): Payer: Medicare PPO

## 2018-02-23 DIAGNOSIS — Z96649 Presence of unspecified artificial hip joint: Secondary | ICD-10-CM

## 2018-02-23 DIAGNOSIS — Z87440 Personal history of urinary (tract) infections: Secondary | ICD-10-CM | POA: Diagnosis not present

## 2018-02-23 DIAGNOSIS — M48061 Spinal stenosis, lumbar region without neurogenic claudication: Secondary | ICD-10-CM | POA: Diagnosis present

## 2018-02-23 DIAGNOSIS — Z9049 Acquired absence of other specified parts of digestive tract: Secondary | ICD-10-CM | POA: Diagnosis not present

## 2018-02-23 DIAGNOSIS — S72002A Fracture of unspecified part of neck of left femur, initial encounter for closed fracture: Principal | ICD-10-CM | POA: Diagnosis present

## 2018-02-23 DIAGNOSIS — Z974 Presence of external hearing-aid: Secondary | ICD-10-CM | POA: Diagnosis not present

## 2018-02-23 DIAGNOSIS — M5136 Other intervertebral disc degeneration, lumbar region: Secondary | ICD-10-CM | POA: Diagnosis present

## 2018-02-23 DIAGNOSIS — M19041 Primary osteoarthritis, right hand: Secondary | ICD-10-CM | POA: Diagnosis present

## 2018-02-23 DIAGNOSIS — W010XXA Fall on same level from slipping, tripping and stumbling without subsequent striking against object, initial encounter: Secondary | ICD-10-CM | POA: Diagnosis not present

## 2018-02-23 DIAGNOSIS — Z66 Do not resuscitate: Secondary | ICD-10-CM | POA: Diagnosis present

## 2018-02-23 DIAGNOSIS — H9193 Unspecified hearing loss, bilateral: Secondary | ICD-10-CM | POA: Diagnosis not present

## 2018-02-23 DIAGNOSIS — M19042 Primary osteoarthritis, left hand: Secondary | ICD-10-CM | POA: Diagnosis present

## 2018-02-23 DIAGNOSIS — Z9842 Cataract extraction status, left eye: Secondary | ICD-10-CM

## 2018-02-23 DIAGNOSIS — Z9989 Dependence on other enabling machines and devices: Secondary | ICD-10-CM | POA: Diagnosis not present

## 2018-02-23 DIAGNOSIS — S72009A Fracture of unspecified part of neck of unspecified femur, initial encounter for closed fracture: Secondary | ICD-10-CM | POA: Diagnosis present

## 2018-02-23 DIAGNOSIS — D62 Acute posthemorrhagic anemia: Secondary | ICD-10-CM | POA: Diagnosis not present

## 2018-02-23 DIAGNOSIS — F05 Delirium due to known physiological condition: Secondary | ICD-10-CM | POA: Diagnosis not present

## 2018-02-23 DIAGNOSIS — Z9841 Cataract extraction status, right eye: Secondary | ICD-10-CM

## 2018-02-23 DIAGNOSIS — K219 Gastro-esophageal reflux disease without esophagitis: Secondary | ICD-10-CM | POA: Diagnosis present

## 2018-02-23 DIAGNOSIS — R9431 Abnormal electrocardiogram [ECG] [EKG]: Secondary | ICD-10-CM

## 2018-02-23 DIAGNOSIS — Z888 Allergy status to other drugs, medicaments and biological substances status: Secondary | ICD-10-CM

## 2018-02-23 DIAGNOSIS — Z880 Allergy status to penicillin: Secondary | ICD-10-CM

## 2018-02-23 DIAGNOSIS — M25552 Pain in left hip: Secondary | ICD-10-CM | POA: Diagnosis present

## 2018-02-23 DIAGNOSIS — E785 Hyperlipidemia, unspecified: Secondary | ICD-10-CM | POA: Diagnosis not present

## 2018-02-23 DIAGNOSIS — Z882 Allergy status to sulfonamides status: Secondary | ICD-10-CM

## 2018-02-23 DIAGNOSIS — Y92009 Unspecified place in unspecified non-institutional (private) residence as the place of occurrence of the external cause: Secondary | ICD-10-CM

## 2018-02-23 DIAGNOSIS — Z8379 Family history of other diseases of the digestive system: Secondary | ICD-10-CM

## 2018-02-23 DIAGNOSIS — W19XXXA Unspecified fall, initial encounter: Secondary | ICD-10-CM | POA: Diagnosis not present

## 2018-02-23 DIAGNOSIS — Z8249 Family history of ischemic heart disease and other diseases of the circulatory system: Secondary | ICD-10-CM

## 2018-02-23 DIAGNOSIS — Y92008 Other place in unspecified non-institutional (private) residence as the place of occurrence of the external cause: Secondary | ICD-10-CM

## 2018-02-23 DIAGNOSIS — L89229 Pressure ulcer of left hip, unspecified stage: Secondary | ICD-10-CM | POA: Diagnosis not present

## 2018-02-23 DIAGNOSIS — Z90711 Acquired absence of uterus with remaining cervical stump: Secondary | ICD-10-CM | POA: Diagnosis not present

## 2018-02-23 DIAGNOSIS — M810 Age-related osteoporosis without current pathological fracture: Secondary | ICD-10-CM | POA: Diagnosis present

## 2018-02-23 DIAGNOSIS — I4581 Long QT syndrome: Secondary | ICD-10-CM | POA: Diagnosis present

## 2018-02-23 DIAGNOSIS — E559 Vitamin D deficiency, unspecified: Secondary | ICD-10-CM | POA: Diagnosis not present

## 2018-02-23 DIAGNOSIS — Z79899 Other long term (current) drug therapy: Secondary | ICD-10-CM

## 2018-02-23 DIAGNOSIS — S0003XA Contusion of scalp, initial encounter: Secondary | ICD-10-CM | POA: Diagnosis present

## 2018-02-23 DIAGNOSIS — M170 Bilateral primary osteoarthritis of knee: Secondary | ICD-10-CM | POA: Diagnosis present

## 2018-02-23 DIAGNOSIS — Z7951 Long term (current) use of inhaled steroids: Secondary | ICD-10-CM

## 2018-02-23 DIAGNOSIS — L8990 Pressure ulcer of unspecified site, unspecified stage: Secondary | ICD-10-CM

## 2018-02-23 LAB — BASIC METABOLIC PANEL
Anion gap: 12 (ref 5–15)
BUN: 21 mg/dL (ref 8–23)
CO2: 24 mmol/L (ref 22–32)
Calcium: 9.2 mg/dL (ref 8.9–10.3)
Chloride: 99 mmol/L (ref 98–111)
Creatinine, Ser: 0.97 mg/dL (ref 0.44–1.00)
GFR calc Af Amer: 56 mL/min — ABNORMAL LOW (ref >60)
GFR calc non Af Amer: 48 mL/min — ABNORMAL LOW (ref >60)
Glucose, Bld: 102 mg/dL — ABNORMAL HIGH (ref 70–99)
Potassium: 3.9 mmol/L (ref 3.5–5.1)
Sodium: 135 mmol/L (ref 135–145)

## 2018-02-23 LAB — CBC WITH DIFFERENTIAL/PLATELET
Abs Immature Granulocytes: 0.1 10*3/uL — ABNORMAL HIGH (ref 0.00–0.07)
BASOS ABS: 0 10*3/uL (ref 0.0–0.1)
Basophils Relative: 0 %
EOS ABS: 0 10*3/uL (ref 0.0–0.5)
Eosinophils Relative: 0 %
HCT: 33.9 % — ABNORMAL LOW (ref 36.0–46.0)
Hemoglobin: 10.7 g/dL — ABNORMAL LOW (ref 12.0–15.0)
Immature Granulocytes: 1 %
Lymphocytes Relative: 13 %
Lymphs Abs: 1.4 10*3/uL (ref 0.7–4.0)
MCH: 31.8 pg (ref 26.0–34.0)
MCHC: 31.6 g/dL (ref 30.0–36.0)
MCV: 100.6 fL — ABNORMAL HIGH (ref 80.0–100.0)
Monocytes Absolute: 0.8 10*3/uL (ref 0.1–1.0)
Monocytes Relative: 7 %
NEUTROS PCT: 79 %
Neutro Abs: 9 10*3/uL — ABNORMAL HIGH (ref 1.7–7.7)
Platelets: 306 10*3/uL (ref 150–400)
RBC: 3.37 MIL/uL — ABNORMAL LOW (ref 3.87–5.11)
RDW: 12.9 % (ref 11.5–15.5)
WBC: 11.3 10*3/uL — ABNORMAL HIGH (ref 4.0–10.5)
nRBC: 0 % (ref 0.0–0.2)

## 2018-02-23 LAB — PROTIME-INR
INR: 0.89
Prothrombin Time: 12 seconds (ref 11.4–15.2)

## 2018-02-23 MED ORDER — HYDROCODONE-ACETAMINOPHEN 5-325 MG PO TABS
1.0000 | ORAL_TABLET | Freq: Four times a day (QID) | ORAL | Status: DC | PRN
  Administered 2018-02-24 (×2): 2 via ORAL
  Filled 2018-02-23 (×2): qty 2

## 2018-02-23 MED ORDER — FENTANYL CITRATE (PF) 100 MCG/2ML IJ SOLN
50.0000 ug | Freq: Once | INTRAMUSCULAR | Status: AC
  Administered 2018-02-23: 50 ug via INTRAVENOUS
  Filled 2018-02-23: qty 2

## 2018-02-23 MED ORDER — MORPHINE SULFATE (PF) 2 MG/ML IV SOLN: 0.5000 mg | INTRAVENOUS | Status: DC | PRN

## 2018-02-23 MED ORDER — ENOXAPARIN SODIUM 40 MG/0.4ML ~~LOC~~ SOLN
40.0000 mg | Freq: Every day | SUBCUTANEOUS | Status: DC
  Filled 2018-02-23: qty 0.4

## 2018-02-23 MED ORDER — TRANEXAMIC ACID-NACL 1000-0.7 MG/100ML-% IV SOLN
1000.0000 mg | INTRAVENOUS | Status: AC
  Administered 2018-02-24: 1000 mg via INTRAVENOUS
  Filled 2018-02-23 (×3): qty 100

## 2018-02-23 NOTE — H&P (Signed)
History and Physical    Lowella BandyMolene C Cacciola ZOX:[id number] DOB: 07/07/1916 DOA: 02/23/2018  Referring MD/NP/PA: Meridee ScoreMichael Butler, MD PCP: Judy Pimpleower, Marne A, MD  Patient coming from: home via EMS  Chief Complaint: Fall  I have personally briefly reviewed patient's old medical records in [employer]   HPI: Eather ColasMolene C Sedonia SmallRoberson is a 84 y.o. female with medical history significant of HLD, arthritis, and osteoporosis; presents after having a fall at home.  Patient had just came back from [place] when she reports losing her balance causing her to fall hitting her head onto the concrete.  Denies any loss of consciousness and complained of left knee pain.  Movement of the affected leg seem to worsen pain. Patient reports not being on any blood thinners and is relatively healthy.  Normally ambulates with use of a Rollator or cane.  She denies having any chest pain, shortness of breath, nausea, vomiting, diarrhea, leg swelling,  ED Course: Upon admission into the emergency department patient noted to have normal vitals.  Labs revealed WBC 11.3, hemoglobin 10.7, and all other labs relatively within normal limits.  CT scan of the head showed a left parietal hematoma without signs of intracranial bleeding.  X-rays revealed an acute minimally displaced left femoral neck fracture.  Dr. Luiz BlareGraves of orthopedics was consulted and plans on surgery tomorrow and would like the patient [facility].  Review of Systems  Constitutional: Negative for chills, fever and weight loss.  HENT: Positive for hearing loss. Negative for congestion.   Eyes: Negative for photophobia and pain.  Respiratory: Negative for cough and shortness of breath.   Cardiovascular: Negative for chest pain and leg swelling.  Gastrointestinal: Negative for abdominal pain, nausea and vomiting.  Genitourinary: Negative for dysuria and hematuria.  Musculoskeletal: Positive for falls, joint pain and myalgias.  Skin: Negative for itching.    Neurological: Negative for focal weakness and loss of consciousness.  Endo/Heme/Allergies: Bruises/bleeds easily.  Psychiatric/Behavioral: Negative for substance abuse. The patient is not nervous/anxious.     Past Medical History:  Diagnosis Date  . Arthritis    Osteoarthritis hands and knees/?RA  . Baker's cyst   . Cataract   . Degenerative disc disease, lumbar    some spinal stenosis  . Diverticulitis 2009  . Gallstones 2009  . GERD (gastroesophageal reflux disease)   . Hyperlipidemia   . Osteoporosis   . Recurrent UTI   . Shingles   . Wrist fracture, right 2008    Past Surgical History:  Procedure Laterality Date  . APPENDECTOMY    . BREAST SURGERY     cyst removed left breast  . cataracts surg     bil  . CHOLECYSTECTOMY  2012   . PARTIAL HYSTERECTOMY    . TRIGGER FINGER RELEASE     bil hands  . tumor removed     removed from lining of heart     reports that she has never smoked. She has never used smokeless tobacco. She reports that she does not drink alcohol or use drugs.  Allergies  Allergen Reactions  . Alendronate Sodium Swelling    REACTION: GI  . Penicillins     REACTION: rash  . Simvastatin     Leg pain and cramping   . Sulfonamide Derivatives     REACTION: rash    Family History  Problem Relation Age of Onset  . Heart disease Father        CAD  . Heart disease Brother  CAD  . Heart disease Brother        CAD  . Diverticulitis Sister     Prior to Admission medications   Medication Sig Start Date End Date Taking? Authorizing Provider  acetaminophen (TYLENOL) 500 MG tablet Take 500 mg by mouth every 8 (eight) hours as needed for mild pain.    Yes [provider]  Cholecalciferol (VITAMIN D3 PO) Take 1 capsule by mouth daily.   Yes [provider]  fluticasone (FLONASE) 50 MCG/ACT nasal spray Place 1 spray into both nostrils daily. Patient taking differently: Place 1 spray into both nostrils daily as needed.   12/01/16  Yes Tower, Audrie GallusMarne A, MD  Hypromellose ([employer] GENTLE LUBRICANT EYE DROPS OP) Apply 2 drops to eye 4 (four) times daily as needed (dry eyes).    Yes [provider]  iron polysaccharides (FERREX 150) 150 MG capsule Take 1 capsule (150 mg total) by mouth 2 (two) times daily. 03/03/17  Yes Tower, Audrie GallusMarne A, MD  ranitidine (ZANTAC) 75 MG tablet Take 75 mg by mouth daily as needed for heartburn.    Yes [provider]  cephALEXin (KEFLEX) 250 MG capsule Take 1 capsule (250 mg total) by mouth 2 (two) times daily. Patient not taking: Reported on 02/23/2018 02/13/18   Judy Pimpleower, Marne A, MD    Physical Exam:  Constitutional: Female, NAD, calm, comfortable Vitals:   02/23/18 1859 02/23/18 1903 02/23/18 2144  BP: (!) 129/56  133/77  Pulse: 88  95  Resp: (!) 24  (!) 24  Temp: 97.7 F (36.5 C)    TempSrc: Oral    SpO2: 97% 96% 99%   Eyes: PERRL, lids and conjunctivae normal ENMT: Mucous membranes are moist. Posterior pharynx clear of any exudate or lesions. Hearing aids present. Neck: normal, supple, no masses, no thyromegaly Respiratory: clear to auscultation bilaterally, no wheezing, no crackles. Normal respiratory effort. No accessory muscle use.  Cardiovascular: Regular rate and rhythm, no murmurs / rubs / gallops. No extremity edema. 2+ pedal pulses. No carotid bruits.  Abdomen: no tenderness, no masses palpated. No hepatosplenomegaly. Bowel sounds positive.  Musculoskeletal: Deformity of the bilateral hand joints.  Left leg is externally rotated and shortened. Skin: Bruising noted to the left parietal scalp. Neurologic: CN 2-12 grossly intact. Sensation intact, DTR normal. Strength 5/5 in all 4.  Psychiatric: Normal judgment and insight. Alert and oriented x 3. Normal mood.     Labs on Admission: I have personally reviewed following labs and imaging studies  CBC: Recent Labs  Lab 02/23/18 2047  WBC 11.3*  NEUTROABS 9.0*  HGB 10.7*  HCT 33.9*  MCV 100.6*  PLT 306    Basic Metabolic Panel: Recent Labs  Lab 02/23/18 2047  NA 135  K 3.9  CL 99  CO2 24  GLUCOSE 102*  BUN 21  CREATININE 0.97  CALCIUM 9.2   GFR: Estimated Creatinine Clearance: 25.2 mL/min (by C-G formula based on SCr of 0.97 mg/dL). Liver Function Tests: No results for input(s): AST, ALT, ALKPHOS, BILITOT, PROT, ALBUMIN in the last 168 hours. No results for input(s): LIPASE, AMYLASE in the last 168 hours. No results for input(s): AMMONIA in the last 168 hours. Coagulation Profile: Recent Labs  Lab 02/23/18 2047  INR 0.89   Cardiac Enzymes: No results for input(s): CKTOTAL, CKMB, CKMBINDEX, TROPONINI in the last 168 hours. BNP (last 3 results) No results for input(s): PROBNP in the last 8760 hours. HbA1C: No results for input(s): HGBA1C in the last 72 hours. CBG:  No results for input(s): GLUCAP in the last 168 hours. Lipid Profile: No results for input(s): CHOL, HDL, LDLCALC, TRIG, CHOLHDL, LDLDIRECT in the last 72 hours. Thyroid Function Tests: No results for input(s): TSH, T4TOTAL, FREET4, T3FREE, THYROIDAB in the last 72 hours. Anemia Panel: No results for input(s): VITAMINB12, FOLATE, FERRITIN, TIBC, IRON, RETICCTPCT in the last 72 hours. Urine analysis:    Component Value Date/Time   COLORURINE YELLOW 10/30/2010 1601   APPEARANCEUR CLOUDY (A) 10/30/2010 1601   LABSPEC 1.005 10/30/2010 1601   PHURINE 8.0 10/30/2010 1601   GLUCOSEU NEGATIVE 10/30/2010 1601   HGBUR TRACE (A) 10/30/2010 1601   HGBUR trace-intact 03/25/2009 1547   BILIRUBINUR 1 mg/dL 82/95/6213 0865   KETONESUR NEGATIVE 10/30/2010 1601   PROTEINUR Positive (A) 02/13/2018 1410   PROTEINUR NEGATIVE 10/30/2010 1601   UROBILINOGEN 1.0 02/13/2018 1410   UROBILINOGEN 0.2 10/30/2010 1601   NITRITE Positive 02/13/2018 1410   NITRITE NEGATIVE 10/30/2010 1601   LEUKOCYTESUR Negative 02/13/2018 1410   Sepsis Labs: No results found for this or any previous visit (from the past 240 hour(s)).    Radiological Exams on Admission: Dg Chest 1 View  Result Date: 02/23/2018 CLINICAL DATA:  Left hip pain after a fall today. EXAM: CHEST  1 VIEW COMPARISON:  02/13/2018 FINDINGS: Shallow inspiration with elevation of left hemidiaphragm. Linear atelectasis in the lung bases. Mild cardiac enlargement. No vascular congestion, edema, or consolidation. No blunting of costophrenic angles. No pneumothorax. Mediastinal contours appear intact. Similar appearance of the chest to previous study. IMPRESSION: Shallow inspiration with linear atelectasis in the lung bases. Mild cardiac enlargement. No evidence of active pulmonary disease. Electronically Signed   By: Burman Nieves M.D.   On: 02/23/2018 21:50   Dg Knee 1-2 Views Left  Result Date: 02/23/2018 CLINICAL DATA:  84 y/o  F; fall with left hip and knee pain. EXAM: LEFT KNEE - 1-2 VIEW COMPARISON:  None. FINDINGS: Bones are demineralized. No acute fracture or dislocation identified. No joint effusion. Vascular calcifications noted. Calcification with femorotibial compartments is compatible with chondrocalcinosis. IMPRESSION: 1. No acute fracture or dislocation identified. 2. Femorotibial compartments chondrocalcinosis. Electronically Signed   By: Mitzi Hansen M.D.   On: 02/23/2018 20:50   Ct Head Wo Contrast  Result Date: 02/23/2018 CLINICAL DATA:  Fall.  Head injury EXAM: CT HEAD WITHOUT CONTRAST TECHNIQUE: Contiguous axial images were obtained from the base of the skull through the vertex without intravenous contrast. COMPARISON:  None. FINDINGS: Brain: Generalized atrophy. Moderately large bifrontal subdural hygromas. No acute hemorrhage or mass. Mild chronic ischemic change. No acute infarct. No midline shift. Vascular: Arterial calcification.  Negative for hyperdense vessel Skull: Negative for skull fracture.  Left parietal scalp hematoma Sinuses/Orbits: Mild mucosal edema paranasal sinuses. Bilateral ocular surgery. Other: None  IMPRESSION: No acute intracranial abnormality Left parietal scalp hematoma Electronically Signed   By: Marlan Palau M.D.   On: 02/23/2018 20:45   Dg Hip Unilat With Pelvis 2-3 Views Left  Result Date: 02/23/2018 CLINICAL DATA:  84 y/o  F; fall with left hip and knee pain. EXAM: DG HIP (WITH OR WITHOUT PELVIS) 2-3V LEFT COMPARISON:  None. FINDINGS: Acute minimally displaced left femoral neck fracture. No dislocation of the femur joints. No acute pelvic fracture or diastasis. Bones are demineralized. IMPRESSION: Acute minimally displaced left femoral neck fracture. No joint dislocation. Electronically Signed   By: Mitzi Hansen M.D.   On: 02/23/2018 20:48    EKG: Independently reviewed.  Sinus rhythm 97 bpm with prolonged  QTc 505.  Assessment/Plan Left femoral neck fracture 2/2 fall at home: Acute.  Patient had a mechanical fall at home found to have a left femoral neck fracture.  Dr. Luiz Blare of orthopedics was consulted and plans to operate tomorrow.  Patient is otherwise healthy and has a geriatric perioperative myocardial infarction or cardiac arrest risks of 0.3%. - Admit to a MedSurg bed - Hip fracture order set initiated. - Hydrocodone/morphine as needed for pain - N.p.o. after midnight - Appreciate orthopedic consultative services will follow-up for further recommendation  Prolonged QT: Acute.  On initial QTC prolonged at 505. - Correct any electrolyte abnormalities - Recheck EKG in a.m.  Left scalp hematoma: Acute.  CT imaging of the brain showed no intracranial bleeding. - Symptomatic treatment  GERD - Held PPI due to prolonged QT interval  DVT prophylaxis: lovenox  Code Status:  DNR Family Communication: Discussed plan of care with the patient and family present at bedside Disposition Plan: To be determined Consults called: Orthopedic Admission status: Inpatient   Clydie Braun MD [facility] Pager [phone]   If 7PM-7AM, please contact  night-coverage [email or web address] Password [facility]  02/23/2018, 10:25 PM

## 2018-02-23 NOTE — ED Provider Notes (Signed)
[facility]-EMERGENCY DEPT Provider Note   CSN: [id number] Arrival date & time: 02/23/18  6578     History   Chief Complaint Chief Complaint  Patient presents with  . Fall    HPI Chloe Cooper is a 84 y.o. female.  She is a history of arthritis and has a baseline unsteady gait and uses a cane.  She said she was coming back from dinner today and lost her balance on the porch and fell to the ground.  She struck the back of her head and is complaining of severe left knee pain.  There is no LOC and she is not on any blood thinners.  She denies any neck pain numbness or weakness chest pain or shortness of breath abdominal pain.  The history is provided by the patient.  Fall  This is a new problem. The current episode started 1 to 2 hours ago. The problem occurs constantly. The problem has not changed since onset.Pertinent negatives include no chest pain, no abdominal pain, no headaches and no shortness of breath. The symptoms are aggravated by bending and twisting. The symptoms are relieved by position. She has tried nothing for the symptoms. The treatment provided no relief.    Past Medical History:  Diagnosis Date  . Arthritis    Osteoarthritis hands and knees/?RA  . Baker's cyst   . Cataract   . Degenerative disc disease, lumbar    some spinal stenosis  . Diverticulitis 2009  . Gallstones 2009  . GERD (gastroesophageal reflux disease)   . Hyperlipidemia   . Osteoporosis   . Recurrent UTI   . Shingles   . Wrist fracture, right 2008    Patient Active Problem List   Diagnosis Date Noted  . Productive cough 02/13/2018  . Abdominal pain, left lower quadrant 02/13/2018  . Acute cystitis without hematuria 08/26/2017  . Intertrigo 04/19/2017  . B12 deficiency 12/24/2015  . Anemia 11/25/2015  . Burning sensation of mouth 04/22/2015  . Lumbar pain 10/30/2014  . Bilateral knee pain 10/30/2014  . Ingrown toenail 07/17/2014  . Encounter for Medicare  annual wellness exam 02/23/2013  . Headache 06/09/2012  . Recurrent UTI 05/25/2011  . Back pain, thoracic 11/11/2010  . Thoracic back pain 11/07/2010  . Constipation 09/30/2010  . Osteoarthritis 08/21/2010  . Vitamin D deficiency 10/08/2008  . ESSENTIAL HYPERTENSION, BENIGN 10/02/2008  . HYPERCHOLESTEROLEMIA 07/19/2006  . Osteoporosis 07/19/2006    Past Surgical History:  Procedure Laterality Date  . APPENDECTOMY    . BREAST SURGERY     cyst removed left breast  . cataracts surg     bil  . CHOLECYSTECTOMY  2012   . PARTIAL HYSTERECTOMY    . TRIGGER FINGER RELEASE     bil hands  . tumor removed     removed from lining of heart     OB History   No obstetric history on file.      Home Medications    Prior to Admission medications   Medication Sig Start Date End Date Taking? Authorizing Provider  acetaminophen (TYLENOL) 500 MG tablet Take 500 mg by mouth every 8 (eight) hours as needed.    [provider]  cephALEXin (KEFLEX) 250 MG capsule Take 1 capsule (250 mg total) by mouth 2 (two) times daily. 02/13/18   Tower, Audrie Gallus, MD  Cholecalciferol (VITAMIN D3 PO) Take 1 capsule by mouth daily.    [provider]  fluticasone (FLONASE) 50 MCG/ACT nasal spray Place 1 spray  into both nostrils daily. Patient taking differently: Place 1 spray into both nostrils daily as needed.  12/01/16   Tower, Audrie GallusMarne A, MD  Hypromellose ([employer] GENTLE LUBRICANT EYE DROPS OP) Apply 2 drops to eye 4 (four) times daily as needed (dry eyes).     [provider]  iron polysaccharides (FERREX 150) 150 MG capsule Take 1 capsule (150 mg total) by mouth 2 (two) times daily. 03/03/17   Tower, Audrie GallusMarne A, MD  ranitidine (ZANTAC) 75 MG tablet Take 75 mg by mouth 2 (two) times daily. As needed    [provider]    Family History Family History  Problem Relation Age of Onset  . Heart disease Father        CAD  . Heart disease Brother        CAD  . Heart disease Brother         CAD  . Diverticulitis Sister     Social History Social History   Tobacco Use  . Smoking status: Never Smoker  . Smokeless tobacco: Never Used  Substance Use Topics  . Alcohol use: No    Alcohol/week: 0.0 standard drinks  . Drug use: No     Allergies   Alendronate sodium; Penicillins; Simvastatin; and Sulfonamide derivatives   Review of Systems Review of Systems  Constitutional: Negative for fever.  HENT: Negative for sore throat.   Eyes: Negative for visual disturbance.  Respiratory: Negative for shortness of breath.   Cardiovascular: Negative for chest pain.  Gastrointestinal: Negative for abdominal pain.  Genitourinary: Negative for dysuria.  Musculoskeletal: Positive for arthralgias.  Skin: Negative for rash.  Neurological: Negative for headaches.     Physical Exam Updated Vital Signs BP (!) 129/56 (BP Location: Right Arm)   Pulse 88   Temp 97.7 F (36.5 C) (Oral)   Resp (!) 24   SpO2 96%   Physical Exam Vitals signs and nursing note reviewed.  Constitutional:      General: She is not in acute distress.    Appearance: Normal appearance. She is well-developed.  HENT:     Head: Normocephalic and atraumatic.  Eyes:     Conjunctiva/sclera: Conjunctivae normal.  Neck:     Musculoskeletal: Normal range of motion and neck supple. No muscular tenderness.  Cardiovascular:     Rate and Rhythm: Normal rate and regular rhythm.     Heart sounds: No murmur.  Pulmonary:     Effort: Pulmonary effort is normal. No respiratory distress.     Breath sounds: Normal breath sounds.  Abdominal:     Palpations: Abdomen is soft.     Tenderness: There is no abdominal tenderness.  Musculoskeletal:        General: Tenderness present.     Comments: Pelvis is stable to compression.  No apparent tenderness about the left hip.  She is got diffuse tenderness around the left knee although no joint swelling and no bruising.  She is on able to range of motion that knee secondary  to pain.  Ankle nontender.  Right lower extremity and bilateral upper extremities full range of motion without tenderness.  Skin:    General: Skin is warm and dry.     Capillary Refill: Capillary refill takes less than 2 seconds.  Neurological:     General: No focal deficit present.     Mental Status: She is alert.      ED Treatments / Results  Labs (all labs ordered are listed, but only abnormal results are displayed)  Labs Reviewed  BASIC METABOLIC PANEL - Abnormal; Notable for the following components:      Result Value   Glucose, Bld 102 (*)    GFR calc non Af Amer 48 (*)    GFR calc Af Amer 56 (*)    All other components within normal limits  CBC WITH DIFFERENTIAL/PLATELET - Abnormal; Notable for the following components:   WBC 11.3 (*)    RBC 3.37 (*)    Hemoglobin 10.7 (*)    HCT 33.9 (*)    MCV 100.6 (*)    Neutro Abs 9.0 (*)    Abs Immature Granulocytes 0.10 (*)    All other components within normal limits  PROTIME-INR  CBC  BASIC METABOLIC PANEL  TYPE AND SCREEN  TYPE AND SCREEN    EKG EKG Interpretation  Date/Time:  Thursday February 23 2018 21:42:40 EST Ventricular Rate:  97 PR Interval:    QRS Duration: 105 QT Interval:  397 QTC Calculation: 505 R Axis:   32 Text Interpretation:  Sinus rhythm Prolonged PR interval Low voltage, extremity and precordial leads Abnormal R-wave progression, early transition Prolonged QT interval poor baseline Confirmed by Meridee Score [phone]) on 02/23/2018 9:47:02 PM   Radiology Dg Chest 1 View  Result Date: 02/23/2018 CLINICAL DATA:  Left hip pain after a fall today. EXAM: CHEST  1 VIEW COMPARISON:  02/13/2018 FINDINGS: Shallow inspiration with elevation of left hemidiaphragm. Linear atelectasis in the lung bases. Mild cardiac enlargement. No vascular congestion, edema, or consolidation. No blunting of costophrenic angles. No pneumothorax. Mediastinal contours appear intact. Similar appearance of the chest to previous  study. IMPRESSION: Shallow inspiration with linear atelectasis in the lung bases. Mild cardiac enlargement. No evidence of active pulmonary disease. Electronically Signed   By: Burman Nieves M.D.   On: 02/23/2018 21:50   Dg Knee 1-2 Views Left  Result Date: 02/23/2018 CLINICAL DATA:  84 y/o  F; fall with left hip and knee pain. EXAM: LEFT KNEE - 1-2 VIEW COMPARISON:  None. FINDINGS: Bones are demineralized. No acute fracture or dislocation identified. No joint effusion. Vascular calcifications noted. Calcification with femorotibial compartments is compatible with chondrocalcinosis. IMPRESSION: 1. No acute fracture or dislocation identified. 2. Femorotibial compartments chondrocalcinosis. Electronically Signed   By: Mitzi Hansen M.D.   On: 02/23/2018 20:50   Ct Head Wo Contrast  Result Date: 02/23/2018 CLINICAL DATA:  Fall.  Head injury EXAM: CT HEAD WITHOUT CONTRAST TECHNIQUE: Contiguous axial images were obtained from the base of the skull through the vertex without intravenous contrast. COMPARISON:  None. FINDINGS: Brain: Generalized atrophy. Moderately large bifrontal subdural hygromas. No acute hemorrhage or mass. Mild chronic ischemic change. No acute infarct. No midline shift. Vascular: Arterial calcification.  Negative for hyperdense vessel Skull: Negative for skull fracture.  Left parietal scalp hematoma Sinuses/Orbits: Mild mucosal edema paranasal sinuses. Bilateral ocular surgery. Other: None IMPRESSION: No acute intracranial abnormality Left parietal scalp hematoma Electronically Signed   By: Marlan Palau M.D.   On: 02/23/2018 20:45   Dg Hip Unilat With Pelvis 2-3 Views Left  Result Date: 02/23/2018 CLINICAL DATA:  84 y/o  F; fall with left hip and knee pain. EXAM: DG HIP (WITH OR WITHOUT PELVIS) 2-3V LEFT COMPARISON:  None. FINDINGS: Acute minimally displaced left femoral neck fracture. No dislocation of the femur joints. No acute pelvic fracture or diastasis. Bones are  demineralized. IMPRESSION: Acute minimally displaced left femoral neck fracture. No joint dislocation. Electronically Signed   By: Buzzy Han.D.  On: 02/23/2018 20:48    Procedures Procedures (including critical care time)  Medications Ordered in ED Medications  fentaNYL (SUBLIMAZE) injection 50 mcg (has no administration in time range)  fentaNYL (SUBLIMAZE) injection 50 mcg (50 mcg Intravenous Given 02/23/18 1939)     Initial Impression / Assessment and Plan / ED Course  I have reviewed the triage vital signs and the nursing notes.  Pertinent labs & imaging results that were available during my care of the patient were reviewed by me and considered in my medical decision making (see chart for details).  Clinical Course as of Feb 23 2229  Thu Feb 23, 2018  2201 [facility] who recommends admitting the patient to the medical service and anticipate surgery to be done tomorrow probably in the afternoon.   [MB]    Clinical Course User Index [MB] Terrilee FilesButler, Enslee Bibbins C, MD    I soke with the son here he says the patient has a living well and she is to be DNR/DNI.  I updated him with the results of the x-ray and the need to contact orthopedic surgery regarding possible surgery.  Final Clinical Impressions(s) / ED Diagnoses   Final diagnoses:  Closed left hip fracture, initial encounter [facility](HCC)    ED Discharge Orders    None       Terrilee FilesButler, Kyrstan Gotwalt C, MD 02/23/18 2311

## 2018-02-23 NOTE — ED Triage Notes (Signed)
Pt BIB EMS from for mechanical fall while standing outside to wood ramp.  C/o pain to left knee, chronic back pain.  By time EMS got to scene, pt had gotten inside to a chair.  No LOC, no blood thinners, no neck or spine pain.    AOx4.

## 2018-02-23 NOTE — Consult Note (Signed)
Reason for Consult:l hip pain Referring Physician: hospitalists  Eather ColasMolene C Sedonia SmallRoberson is an 84 y.o. female.  HPI:  84 yo female who fell earlier tonight.  She is c/o l hip pain.  She has a history of oa of knees and hands but no previous hip piain.  She is admitted to the hospitalists service and we are consulted for treatment of the hip fracture.  Past Medical History:  Diagnosis Date  . Arthritis    Osteoarthritis hands and knees/?RA  . Baker's cyst   . Cataract   . Degenerative disc disease, lumbar    some spinal stenosis  . Diverticulitis 2009  . Gallstones 2009  . GERD (gastroesophageal reflux disease)   . Hyperlipidemia   . Osteoporosis   . Recurrent UTI   . Shingles   . Wrist fracture, right 2008    Past Surgical History:  Procedure Laterality Date  . APPENDECTOMY    . BREAST SURGERY     cyst removed left breast  . cataracts surg     bil  . CHOLECYSTECTOMY  2012   . PARTIAL HYSTERECTOMY    . TRIGGER FINGER RELEASE     bil hands  . tumor removed     removed from lining of heart    Family History  Problem Relation Age of Onset  . Heart disease Father        CAD  . Heart disease Brother        CAD  . Heart disease Brother        CAD  . Diverticulitis Sister     Social History:  reports that she has never smoked. She has never used smokeless tobacco. She reports that she does not drink alcohol or use drugs.  Allergies:  Allergies  Allergen Reactions  . Alendronate Sodium Swelling    REACTION: GI  . Penicillins     REACTION: rash  . Simvastatin     Leg pain and cramping   . Sulfonamide Derivatives     REACTION: rash    Medications: I have reviewed the patient's current medications.  Results for orders placed or performed during the hospital encounter of 02/23/18 (from the past 48 hour(s))  Basic metabolic panel     Status: Abnormal   Collection Time: 02/23/18  8:47 PM  Result Value Ref Range   Sodium 135 135 - 145 mmol/L   Potassium 3.9 3.5 - 5.1  mmol/L   Chloride 99 98 - 111 mmol/L   CO2 24 22 - 32 mmol/L   Glucose, Bld 102 (H) 70 - 99 mg/dL   BUN 21 8 - 23 mg/dL   Creatinine, Ser 4.090.97 0.44 - 1.00 mg/dL   Calcium 9.2 8.9 - 81.110.3 mg/dL   GFR calc non Af Amer 48 (L) >60 mL/min   GFR calc Af Amer 56 (L) >60 mL/min   Anion gap 12 5 - 15    Comment: Performed at [facility], [address] [place], [place] [address]  CBC with Differential     Status: Abnormal   Collection Time: 02/23/18  8:47 PM  Result Value Ref Range   WBC 11.3 (H) 4.0 - 10.5 K/uL   RBC 3.37 (L) 3.87 - 5.11 MIL/uL   Hemoglobin 10.7 (L) 12.0 - 15.0 g/dL   HCT 29.533.9 (L) 62.136.0 - 30.846.0 %   MCV 100.6 (H) 80.0 - 100.0 fL   MCH 31.8 26.0 - 34.0 pg   MCHC 31.6 30.0 - 36.0 g/dL  RDW 12.9 11.5 - 15.5 %   Platelets 306 150 - 400 K/uL   nRBC 0.0 0.0 - 0.2 %   Neutrophils Relative % 79 %   Neutro Abs 9.0 (H) 1.7 - 7.7 K/uL   Lymphocytes Relative 13 %   Lymphs Abs 1.4 0.7 - 4.0 K/uL   Monocytes Relative 7 %   Monocytes Absolute 0.8 0.1 - 1.0 K/uL   Eosinophils Relative 0 %   Eosinophils Absolute 0.0 0.0 - 0.5 K/uL   Basophils Relative 0 %   Basophils Absolute 0.0 0.0 - 0.1 K/uL   Immature Granulocytes 1 %   Abs Immature Granulocytes 0.10 (H) 0.00 - 0.07 K/uL    Comment: Performed at [facility], [address] [place], [place] [address]  Protime-INR     Status: None   Collection Time: 02/23/18  8:47 PM  Result Value Ref Range   Prothrombin Time 12.0 11.4 - 15.2 seconds   INR 0.89     Comment: Performed at [facility], [address] [place], [place] [address]    Dg Chest 1 View  Result Date: 02/23/2018 CLINICAL DATA:  Left hip pain after a fall today. EXAM: CHEST  1 VIEW COMPARISON:  02/13/2018 FINDINGS: Shallow inspiration with elevation of left hemidiaphragm. Linear atelectasis in the lung bases. Mild cardiac enlargement. No vascular congestion, edema, or consolidation. No blunting of costophrenic  angles. No pneumothorax. Mediastinal contours appear intact. Similar appearance of the chest to previous study. IMPRESSION: Shallow inspiration with linear atelectasis in the lung bases. Mild cardiac enlargement. No evidence of active pulmonary disease. Electronically Signed   By: Burman NievesWilliam  Stevens M.D.   On: 02/23/2018 21:50   Dg Knee 1-2 Views Left  Result Date: 02/23/2018 CLINICAL DATA:  84 y/o  F; fall with left hip and knee pain. EXAM: LEFT KNEE - 1-2 VIEW COMPARISON:  None. FINDINGS: Bones are demineralized. No acute fracture or dislocation identified. No joint effusion. Vascular calcifications noted. Calcification with femorotibial compartments is compatible with chondrocalcinosis. IMPRESSION: 1. No acute fracture or dislocation identified. 2. Femorotibial compartments chondrocalcinosis. Electronically Signed   By: Mitzi HansenLance  Furusawa-Stratton M.D.   On: 02/23/2018 20:50   Ct Head Wo Contrast  Result Date: 02/23/2018 CLINICAL DATA:  Fall.  Head injury EXAM: CT HEAD WITHOUT CONTRAST TECHNIQUE: Contiguous axial images were obtained from the base of the skull through the vertex without intravenous contrast. COMPARISON:  None. FINDINGS: Brain: Generalized atrophy. Moderately large bifrontal subdural hygromas. No acute hemorrhage or mass. Mild chronic ischemic change. No acute infarct. No midline shift. Vascular: Arterial calcification.  Negative for hyperdense vessel Skull: Negative for skull fracture.  Left parietal scalp hematoma Sinuses/Orbits: Mild mucosal edema paranasal sinuses. Bilateral ocular surgery. Other: None IMPRESSION: No acute intracranial abnormality Left parietal scalp hematoma Electronically Signed   By: Marlan Palauharles  Clark M.D.   On: 02/23/2018 20:45   Dg Hip Unilat With Pelvis 2-3 Views Left  Result Date: 02/23/2018 CLINICAL DATA:  84 y/o  F; fall with left hip and knee pain. EXAM: DG HIP (WITH OR WITHOUT PELVIS) 2-3V LEFT COMPARISON:  None. FINDINGS: Acute minimally displaced left  femoral neck fracture. No dislocation of the femur joints. No acute pelvic fracture or diastasis. Bones are demineralized. IMPRESSION: Acute minimally displaced left femoral neck fracture. No joint dislocation. Electronically Signed   By: Mitzi HansenLance  Furusawa-Stratton M.D.   On: 02/23/2018 20:48    ROS  ROS: I have reviewed the patient's review of systems thoroughly and there are  no positive responses as relates to the HPI. Blood pressure 133/77, pulse 95, temperature 97.7 F (36.5 C), temperature source Oral, resp. rate (!) 24, SpO2 99 %. Physical Exam Well-developed well-nourished patient in no acute distress. Alert and oriented x3 HEENT:within normal limits Cardiac: Regular rate and rhythm Pulmonary: Lungs clear to auscultation Abdomen: Soft and nontender.  Normal active bowel sounds  Musculoskeletal: (l hip : piainful rom limited rom.   Assessment/Plan: 84 yo female with displaced fem neck fracture// She needs hemi arthroplasty.  Pt is aware of risks of surgery including but not limited to bleeding, infection, dislocation, need for further suregry and death in and around the time of surgery.  She wishes toi proceed with surgery.   Harvie Junior 02/23/2018, 10:58 PM

## 2018-02-23 NOTE — ED Notes (Signed)
[facility] Expected date:  Expected time:  Means of arrival:  Comments: Ems-FALL-KNEE PAIN

## 2018-02-24 ENCOUNTER — Inpatient Hospital Stay (HOSPITAL_COMMUNITY): Payer: Medicare PPO | Admitting: Certified Registered"

## 2018-02-24 ENCOUNTER — Encounter (HOSPITAL_COMMUNITY)
Admission: EM | Disposition: A | Discharge status: Skilled Nursing Facility | Payer: Self-pay | Source: Home / Self Care | Attending: Family Medicine

## 2018-02-24 ENCOUNTER — Inpatient Hospital Stay: Admit: 2018-02-24 | Payer: Medicare PPO | Admitting: Orthopedic Surgery

## 2018-02-24 ENCOUNTER — Encounter (HOSPITAL_COMMUNITY): Payer: Self-pay

## 2018-02-24 ENCOUNTER — Inpatient Hospital Stay (HOSPITAL_COMMUNITY): Payer: Medicare PPO

## 2018-02-24 DIAGNOSIS — S0003XA Contusion of scalp, initial encounter: Secondary | ICD-10-CM | POA: Diagnosis present

## 2018-02-24 DIAGNOSIS — L89229 Pressure ulcer of left hip, unspecified stage: Secondary | ICD-10-CM

## 2018-02-24 DIAGNOSIS — L8990 Pressure ulcer of unspecified site, unspecified stage: Secondary | ICD-10-CM

## 2018-02-24 DIAGNOSIS — W19XXXA Unspecified fall, initial encounter: Secondary | ICD-10-CM

## 2018-02-24 DIAGNOSIS — R9431 Abnormal electrocardiogram [ECG] [EKG]: Secondary | ICD-10-CM | POA: Diagnosis present

## 2018-02-24 DIAGNOSIS — Y92009 Unspecified place in unspecified non-institutional (private) residence as the place of occurrence of the external cause: Secondary | ICD-10-CM

## 2018-02-24 HISTORY — PX: HIP ARTHROPLASTY: SHX981

## 2018-02-24 LAB — ABO/RH: ABO/RH(D): A POS

## 2018-02-24 LAB — TYPE AND SCREEN
ABO/RH(D): A POS
Antibody Screen: NEGATIVE

## 2018-02-24 SURGERY — HEMIARTHROPLASTY, HIP, DIRECT ANTERIOR APPROACH, FOR FRACTURE
Anesthesia: General | Laterality: Left

## 2018-02-24 SURGERY — HEMIARTHROPLASTY, HIP, DIRECT ANTERIOR APPROACH, FOR FRACTURE
Anesthesia: Spinal | Site: Hip | Laterality: Left

## 2018-02-24 MED ORDER — ONDANSETRON HCL 4 MG PO TABS: 4.0000 mg | ORAL_TABLET | Freq: Four times a day (QID) | ORAL | Status: DC | PRN

## 2018-02-24 MED ORDER — BUPIVACAINE-EPINEPHRINE 0.5% -1:200000 IJ SOLN
INTRAMUSCULAR | Status: DC | PRN
  Administered 2018-02-24: 30 mL

## 2018-02-24 MED ORDER — ACETAMINOPHEN 500 MG PO TABS
500.0000 mg | ORAL_TABLET | Freq: Four times a day (QID) | ORAL | Status: AC
  Administered 2018-02-25 (×3): 500 mg via ORAL
  Filled 2018-02-24 (×3): qty 1

## 2018-02-24 MED ORDER — ALBUTEROL SULFATE (2.5 MG/3ML) 0.083% IN NEBU: 2.5000 mg | INHALATION_SOLUTION | RESPIRATORY_TRACT | Status: DC | PRN

## 2018-02-24 MED ORDER — CEFAZOLIN SODIUM-DEXTROSE 2-4 GM/100ML-% IV SOLN
2.0000 g | INTRAVENOUS | Status: DC
  Filled 2018-02-24: qty 100

## 2018-02-24 MED ORDER — HYDROCODONE-ACETAMINOPHEN 5-325 MG PO TABS
1.0000 | ORAL_TABLET | Freq: Four times a day (QID) | ORAL | Status: DC | PRN
  Administered 2018-02-25 – 2018-02-28 (×11): 1 via ORAL
  Filled 2018-02-24 (×11): qty 1

## 2018-02-24 MED ORDER — ONDANSETRON HCL 4 MG/2ML IJ SOLN: 4.0000 mg | Freq: Four times a day (QID) | INTRAMUSCULAR | Status: DC | PRN

## 2018-02-24 MED ORDER — ACETAMINOPHEN 325 MG PO TABS
325.0000 mg | ORAL_TABLET | Freq: Four times a day (QID) | ORAL | Status: DC | PRN
  Administered 2018-02-26 (×2): 650 mg via ORAL
  Filled 2018-02-24 (×3): qty 2

## 2018-02-24 MED ORDER — HYDROCODONE-ACETAMINOPHEN 5-325 MG PO TABS: 1.0000 | ORAL_TABLET | Freq: Three times a day (TID) | ORAL | 0 refills | Status: DC | PRN

## 2018-02-24 MED ORDER — PHENYLEPHRINE 40 MCG/ML (10ML) SYRINGE FOR IV PUSH (FOR BLOOD PRESSURE SUPPORT)
PREFILLED_SYRINGE | INTRAVENOUS | Status: AC
  Filled 2018-02-24: qty 10

## 2018-02-24 MED ORDER — FENTANYL CITRATE (PF) 250 MCG/5ML IJ SOLN
INTRAMUSCULAR | Status: AC
  Filled 2018-02-24: qty 5

## 2018-02-24 MED ORDER — LACTATED RINGERS IV SOLN
INTRAVENOUS | Status: DC | PRN
  Administered 2018-02-24: 19:00:00 via INTRAVENOUS

## 2018-02-24 MED ORDER — ONDANSETRON HCL 4 MG/2ML IJ SOLN
INTRAMUSCULAR | Status: DC | PRN
  Administered 2018-02-24: 4 mg via INTRAVENOUS

## 2018-02-24 MED ORDER — FLUTICASONE PROPIONATE 50 MCG/ACT NA SUSP: 1.0000 | Freq: Every day | NASAL | Status: DC | PRN

## 2018-02-24 MED ORDER — CEFAZOLIN SODIUM-DEXTROSE 2-4 GM/100ML-% IV SOLN
2.0000 g | Freq: Four times a day (QID) | INTRAVENOUS | Status: AC
  Administered 2018-02-25 (×2): 2 g via INTRAVENOUS
  Filled 2018-02-24 (×2): qty 100

## 2018-02-24 MED ORDER — 0.9 % SODIUM CHLORIDE (POUR BTL) OPTIME
TOPICAL | Status: DC | PRN
  Administered 2018-02-24: 1000 mL

## 2018-02-24 MED ORDER — FENTANYL CITRATE (PF) 100 MCG/2ML IJ SOLN
25.0000 ug | INTRAMUSCULAR | Status: DC | PRN
  Administered 2018-02-24 – 2018-02-26 (×4): 50 ug via INTRAVENOUS
  Filled 2018-02-24 (×5): qty 2

## 2018-02-24 MED ORDER — LACTATED RINGERS IV BOLUS
500.0000 mL | Freq: Once | INTRAVENOUS | Status: AC
  Administered 2018-02-24: 500 mL via INTRAVENOUS

## 2018-02-24 MED ORDER — PHENYLEPHRINE HCL 10 MG/ML IJ SOLN
INTRAMUSCULAR | Status: DC | PRN
  Administered 2018-02-24 (×2): 40 ug via INTRAVENOUS
  Administered 2018-02-24 (×2): 80 ug via INTRAVENOUS

## 2018-02-24 MED ORDER — DEXAMETHASONE SODIUM PHOSPHATE 10 MG/ML IJ SOLN
INTRAMUSCULAR | Status: AC
  Filled 2018-02-24: qty 1

## 2018-02-24 MED ORDER — BUPIVACAINE-EPINEPHRINE (PF) 0.25% -1:200000 IJ SOLN
INTRAMUSCULAR | Status: AC
  Filled 2018-02-24: qty 30

## 2018-02-24 MED ORDER — DOCUSATE SODIUM 100 MG PO CAPS
100.0000 mg | ORAL_CAPSULE | Freq: Two times a day (BID) | ORAL | Status: DC
  Administered 2018-02-25 – 2018-02-28 (×7): 100 mg via ORAL
  Filled 2018-02-24 (×7): qty 1

## 2018-02-24 MED ORDER — BUPIVACAINE IN DEXTROSE 0.75-8.25 % IT SOLN
INTRATHECAL | Status: DC | PRN
  Administered 2018-02-24: 1.6 mL via INTRATHECAL

## 2018-02-24 MED ORDER — POLYVINYL ALCOHOL 1.4 % OP SOLN: 1.0000 [drp] | OPHTHALMIC | Status: DC | PRN

## 2018-02-24 MED ORDER — LIDOCAINE HCL (CARDIAC) PF 100 MG/5ML IV SOSY
PREFILLED_SYRINGE | INTRAVENOUS | Status: DC | PRN
  Administered 2018-02-24: 100 mg via INTRATRACHEAL

## 2018-02-24 MED ORDER — SALINE SPRAY 0.65 % NA SOLN: 1.0000 | NASAL | Status: DC | PRN

## 2018-02-24 MED ORDER — ONDANSETRON HCL 4 MG/2ML IJ SOLN
INTRAMUSCULAR | Status: AC
  Filled 2018-02-24: qty 2

## 2018-02-24 MED ORDER — ASPIRIN EC 325 MG PO TBEC: 325.0000 mg | DELAYED_RELEASE_TABLET | Freq: Every day | ORAL | 0 refills | Status: DC

## 2018-02-24 MED ORDER — ALUM & MAG HYDROXIDE-SIMETH 200-200-20 MG/5ML PO SUSP: 30.0000 mL | ORAL | Status: DC | PRN

## 2018-02-24 MED ORDER — LIDOCAINE 2% (20 MG/ML) 5 ML SYRINGE
INTRAMUSCULAR | Status: AC
  Filled 2018-02-24: qty 5

## 2018-02-24 MED ORDER — DEXAMETHASONE SODIUM PHOSPHATE 10 MG/ML IJ SOLN
INTRAMUSCULAR | Status: DC | PRN
  Administered 2018-02-24: 10 mg via INTRAVENOUS

## 2018-02-24 MED ORDER — CHLORHEXIDINE GLUCONATE 4 % EX LIQD
60.0000 mL | Freq: Once | CUTANEOUS | Status: AC
  Administered 2018-02-24: 4 via TOPICAL

## 2018-02-24 MED ORDER — SODIUM CHLORIDE 0.9 % IV SOLN
INTRAVENOUS | Status: DC
  Administered 2018-02-24: 01:00:00 via INTRAVENOUS

## 2018-02-24 MED ORDER — SODIUM CHLORIDE 0.9 % IR SOLN
Status: DC | PRN
  Administered 2018-02-24: 3000 mL

## 2018-02-24 MED ORDER — PHENOL 1.4 % MT LIQD: 1.0000 | OROMUCOSAL | Status: DC | PRN

## 2018-02-24 MED ORDER — POVIDONE-IODINE 10 % EX SWAB: 2.0000 "application " | Freq: Once | CUTANEOUS | Status: DC

## 2018-02-24 MED ORDER — LIP MEDEX EX OINT
1.0000 "application " | TOPICAL_OINTMENT | CUTANEOUS | Status: DC | PRN
  Filled 2018-02-24: qty 7

## 2018-02-24 MED ORDER — PROPOFOL 10 MG/ML IV BOLUS
INTRAVENOUS | Status: AC
  Filled 2018-02-24: qty 20

## 2018-02-24 MED ORDER — PROPOFOL 10 MG/ML IV BOLUS
INTRAVENOUS | Status: DC | PRN
  Administered 2018-02-24: 40 mg via INTRAVENOUS

## 2018-02-24 MED ORDER — ASPIRIN EC 325 MG PO TBEC
325.0000 mg | DELAYED_RELEASE_TABLET | Freq: Every day | ORAL | Status: DC
  Administered 2018-02-25 – 2018-02-28 (×4): 325 mg via ORAL
  Filled 2018-02-24 (×4): qty 1

## 2018-02-24 MED ORDER — ACETAMINOPHEN 500 MG PO TABS: 500.0000 mg | ORAL_TABLET | Freq: Three times a day (TID) | ORAL | Status: DC | PRN

## 2018-02-24 MED ORDER — CEFAZOLIN SODIUM-DEXTROSE 2-3 GM-%(50ML) IV SOLR
INTRAVENOUS | Status: DC | PRN
  Administered 2018-02-24: 2 g via INTRAVENOUS

## 2018-02-24 SURGICAL SUPPLY — 55 items
BLADE SAW SAG 73X25 THK (BLADE) ×1
BLADE SAW SGTL 73X25 THK (BLADE) ×2 IMPLANT
BRUSH FEMORAL CANAL (MISCELLANEOUS) IMPLANT
COVER WAND RF STERILE (DRAPES) ×3 IMPLANT
DRAPE ORTHO SPLIT 77X108 STRL (DRAPES) ×4
DRAPE SURG ORHT 6 SPLT 77X108 (DRAPES) ×2 IMPLANT
DRAPE U-SHAPE 47X51 STRL (DRAPES) ×3 IMPLANT
DRILL BIT 7/64X5 (BIT) ×3 IMPLANT
DRSG MEPILEX BORDER 4X8 (GAUZE/BANDAGES/DRESSINGS) ×3 IMPLANT
DRSG PAD ABDOMINAL 8X10 ST (GAUZE/BANDAGES/DRESSINGS) ×3 IMPLANT
DURAPREP 26ML APPLICATOR (WOUND CARE) ×3 IMPLANT
ELECT BLADE 6.5 EXT (BLADE) ×3 IMPLANT
ELECT CAUTERY BLADE 6.4 (BLADE) ×3 IMPLANT
ELECT REM PT RETURN 9FT ADLT (ELECTROSURGICAL) ×3
ELECTRODE REM PT RTRN 9FT ADLT (ELECTROSURGICAL) ×1 IMPLANT
EVACUATOR 1/8 PVC DRAIN (DRAIN) IMPLANT
FACESHIELD WRAPAROUND (MASK) ×3 IMPLANT
GAUZE SPONGE 4X4 12PLY STRL (GAUZE/BANDAGES/DRESSINGS) ×3 IMPLANT
GAUZE XEROFORM 1X8 LF (GAUZE/BANDAGES/DRESSINGS) ×3 IMPLANT
GAUZE XEROFORM 5X9 LF (GAUZE/BANDAGES/DRESSINGS) ×3 IMPLANT
GLOVE BIOGEL PI IND STRL 8 (GLOVE) ×2 IMPLANT
GLOVE BIOGEL PI INDICATOR 8 (GLOVE) ×4
GLOVE ECLIPSE 7.5 STRL STRAW (GLOVE) ×6 IMPLANT
GOWN STRL REUS W/ TWL LRG LVL3 (GOWN DISPOSABLE) ×1 IMPLANT
GOWN STRL REUS W/ TWL XL LVL3 (GOWN DISPOSABLE) ×2 IMPLANT
GOWN STRL REUS W/TWL LRG LVL3 (GOWN DISPOSABLE) ×2
GOWN STRL REUS W/TWL XL LVL3 (GOWN DISPOSABLE) ×4
HANDPIECE INTERPULSE COAX TIP (DISPOSABLE)
HEAD FEM UNIPOLAR 43 OD STRL (Hips) ×3 IMPLANT
IMMOBILIZER KNEE 22 UNIV (SOFTGOODS) IMPLANT
KIT BASIN OR (CUSTOM PROCEDURE TRAY) ×3 IMPLANT
KIT TURNOVER KIT B (KITS) ×3 IMPLANT
MANIFOLD NEPTUNE II (INSTRUMENTS) ×3 IMPLANT
NEEDLE 1/2 CIR MAYO (NEEDLE) IMPLANT
NS IRRIG 1000ML POUR BTL (IV SOLUTION) ×3 IMPLANT
PACK TOTAL JOINT (CUSTOM PROCEDURE TRAY) ×3 IMPLANT
PAD ARMBOARD 7.5X6 YLW CONV (MISCELLANEOUS) ×6 IMPLANT
PASSER SUT SWANSON 36MM LOOP (INSTRUMENTS) IMPLANT
SET HNDPC FAN SPRY TIP SCT (DISPOSABLE) IMPLANT
SPACER DEPUY (Hips) ×3 IMPLANT
STAPLER VISISTAT 35W (STAPLE) ×3 IMPLANT
STEM SUMMIT BASIC PRESSFIT SZ3 (Hips) ×1 IMPLANT
SUMMIT BASIC PRESSFIT SZ3 (Hips) ×3 IMPLANT
SUT ETHIBOND 2 V 37 (SUTURE) ×3 IMPLANT
SUT PASSER 2.0 195M (MISCELLANEOUS) ×3 IMPLANT
SUT VIC AB 0 CT1 27 (SUTURE) ×4
SUT VIC AB 0 CT1 27XBRD ANBCTR (SUTURE) ×2 IMPLANT
SUT VIC AB 1 CTX 36 (SUTURE) ×4
SUT VIC AB 1 CTX36XBRD ANBCTR (SUTURE) ×2 IMPLANT
SUT VIC AB 2-0 CT1 36 (SUTURE) ×6 IMPLANT
SYR CONTROL 10ML LL (SYRINGE) IMPLANT
TOWEL OR 17X26 10 PK STRL BLUE (TOWEL DISPOSABLE) ×3 IMPLANT
TOWER CARTRIDGE SMART MIX (DISPOSABLE) IMPLANT
TRAY FOLEY MTR SLVR 16FR STAT (SET/KITS/TRAYS/PACK) IMPLANT
WATER STERILE IRR 1000ML POUR (IV SOLUTION) ×3 IMPLANT

## 2018-02-24 NOTE — Anesthesia Postprocedure Evaluation (Signed)
Anesthesia Post Note  Patient: Chloe Cooper  Procedure(s) Performed: ARTHROPLASTY (HEMIARTHROPLASTY) non cement (Left Hip)     Patient location during evaluation: PACU Anesthesia Type: Spinal Level of consciousness: oriented and awake and alert Pain management: pain level controlled Vital Signs Assessment: post-procedure vital signs reviewed and stable Respiratory status: spontaneous breathing, respiratory function stable and patient connected to nasal cannula oxygen Cardiovascular status: blood pressure returned to baseline and stable Postop Assessment: no headache, no backache and no apparent nausea or vomiting Anesthetic complications: no    Last Vitals:  Vitals:   02/24/18 2125 02/24/18 2132  BP:  (!) 113/56  Pulse: 90 83  Resp: 15 15  Temp:    SpO2: 98% 100%    Last Pain:  Vitals:   02/24/18 2117  TempSrc:   PainSc: 0-No pain                 Calene Paradiso P Loan Oguin

## 2018-02-24 NOTE — Progress Notes (Signed)
Patient arrived via ambulance [facility]cher from [facility]. Patient alert and oriented, respirations even and unlabored skin warm and dry. Patient has a watch, ring, upper and lower dentures, bilateral hearing aides, and eyeglasses.

## 2018-02-24 NOTE — Transfer of Care (Signed)
Immediate Anesthesia Transfer of Care Note  Patient: Chloe Cooper  Procedure(s) Performed: ARTHROPLASTY (HEMIARTHROPLASTY) non cement (Left Hip)  Patient Location: PACU  Anesthesia Type:Spinal  Level of Consciousness: awake, alert  and oriented  Airway & Oxygen Therapy: Patient Spontanous Breathing  Post-op Assessment: Report given to RN and Post -op Vital signs reviewed and stable  Post vital signs: Reviewed and stable  Last Vitals:  Vitals Value Taken Time  BP    Temp    Pulse 92 02/24/2018  8:19 PM  Resp 16 02/24/2018  8:19 PM  SpO2 99 % 02/24/2018  8:19 PM  Vitals shown include unvalidated device data.  Last Pain:  Vitals:   02/24/18 1406  TempSrc: Oral  PainSc:          Complications: No apparent anesthesia complications

## 2018-02-24 NOTE — Anesthesia Procedure Notes (Signed)
Date/Time: 02/24/2018 6:50 PM Performed by: Melina SchoolsBanks, Dustee Bottenfield J, CRNA Pre-anesthesia Checklist: Patient identified, Emergency Drugs available, Suction available, Patient being monitored and Timeout performed Oxygen Delivery Method: Simple face mask

## 2018-02-24 NOTE — Anesthesia Procedure Notes (Signed)
Spinal  Patient location during procedure: OR Start time: 02/24/2018 6:55 PM End time: 02/24/2018 7:05 PM Staffing Anesthesiologist: Leonides GrillsEllender, Ryan P, MD Performed: anesthesiologist  Preanesthetic Checklist Completed: patient identified, surgical consent, pre-op evaluation, timeout performed, IV checked, risks and benefits discussed and monitors and equipment checked Spinal Block Patient position: left lateral decubitus Prep: DuraPrep Patient monitoring: cardiac monitor, continuous pulse ox and blood pressure Approach: left paramedian Location: L4-5 Injection technique: single-shot Needle Needle type: Pencan  Needle gauge: 24 G Needle length: 9 cm Assessment Sensory level: T10 Additional Notes Functioning IV was confirmed and monitors were applied. Sterile prep and drape, including hand hygiene and sterile gloves were used. The patient was positioned and the spine was prepped. The skin was anesthetized with lidocaine.  Free flow of clear CSF was obtained prior to injecting local anesthetic into the CSF.  The spinal needle aspirated freely following injection.  The needle was carefully withdrawn.  The patient tolerated the procedure well.

## 2018-02-24 NOTE — Social Work (Signed)
CSW acknowledging consult for SNF placement. Will follow for therapy recommendations.   Naika Noto, MSW, LCSWA [facility] [facility] Social Work [phone]   

## 2018-02-24 NOTE — Progress Notes (Signed)
Pt's son, Cyndia BentMike Torrens - POA took pt's 4 gold colored rings and watch home today.

## 2018-02-24 NOTE — Brief Op Note (Signed)
02/23/2018 - 02/24/2018  7:52 PM  PATIENT:  Lowella BandyMolene C Niemeier  84 y.o. female  PRE-OPERATIVE DIAGNOSIS:  left hip fracture  POST-OPERATIVE DIAGNOSIS:  left hip fracture  PROCEDURE:  Procedure(s): ARTHROPLASTY (HEMIARTHROPLASTY) non cement (Left)  SURGEON:  Surgeon(s) and Role:    Jodi Geralds* Zavior Thomason, MD - Primary  PHYSICIAN ASSISTANT:   ASSISTANTS: jim bethune   ANESTHESIA:   spinal  EBL:  200cc   BLOOD ADMINISTERED:none  DRAINS: none   LOCAL MEDICATIONS USED:  MARCAINE     SPECIMEN:  No Specimen  DISPOSITION OF SPECIMEN:  N/A  COUNTS:  YES  TOURNIQUET:  * No tourniquets in log *  DICTATION: .Other Dictation: Dictation Number [id number]  PLAN OF CARE: Admit to inpatient   PATIENT DISPOSITION:  PACU - hemodynamically stable.   Delay start of Pharmacological VTE agent (>24hrs) due to surgical blood loss or risk of bleeding: no

## 2018-02-24 NOTE — Progress Notes (Signed)
Subjective: Patient persistently complaining of left hip pain.  [facility] night with a femoral neck fracture.  The [facility] was overloaded [facility] and [facility].  She persists in having pain with any movement of the left leg.   Objective: Vital signs in last 24 hours: Temp:  [97.7 F (36.5 C)-98.3 F (36.8 C)] 98.3 F (36.8 C) (12/20 1406) Pulse Rate:  [85-99] 85 (12/20 1406) Resp:  [15-24] 18 (12/20 1406) BP: (124-164)/(56-98) 141/72 (12/20 1406) SpO2:  [94 %-99 %] 94 % (12/20 1406)  Intake/Output from previous day: No intake/output data recorded. Intake/Output this shift: No intake/output data recorded.  Recent Labs    02/23/18 2047  HGB 10.7*   Recent Labs    02/23/18 2047  WBC 11.3*  RBC 3.37*  HCT 33.9*  PLT 306   Recent Labs    02/23/18 2047  NA 135  K 3.9  CL 99  CO2 24  BUN 21  CREATININE 0.97  GLUCOSE 102*  CALCIUM 9.2   Recent Labs    02/23/18 2047  INR 0.89    Neurologically intact ABD soft Neurovascular intact Sensation intact distally No cellulitis present Compartment soft      Assessment/Plan: 84 year old female with left displaced femoral neck fracture.  She was admitted to the medicine service and cleared for operative intervention.  She will have hemiarthroplasty left hip with cemented versus noncemented technique.  Her family is well aware of the risks and benefits of the surgery and wished to proceed.   Harvie JuniorJohn L Selina Tapper 02/24/2018, 6:34 PM

## 2018-02-24 NOTE — Progress Notes (Signed)
PROGRESS NOTE  PRINCESSA SWEDA ION:[id number] DOB: Feb 12, 1917 DOA: 02/23/2018 PCP: Judy Pimple, MD  HPI/Recap of past 62 hours: 84 year old female with history of hyperlipidemia, arthritis, osteoporosis who presented with a fall at home patient was admitted for persistent left hip pain after a fall at home.  She was transferred from [facility] due to femoral neck fracture for surgical fixation today  Assessment/Plan: Principal Problem:   Femoral neck fracture (HCC) Active Problems:   Fall at home, initial encounter   Hematoma of left parietal scalp   Prolonged QT interval   Pressure injury of skin  1.  Left femoral neck fracture secondary to fall at home.  Surgery has been consulted patient is n.p.o. she will be taken to surgery later this evening  2.  GERD, PPI held due to prolonged QT interval  3.  Left scalp hematoma which is as a result of the fall CT scan imaging of the brain showed no intracranial bleed.  4.  Recent fall at home resulting in femoral neck fracture of the left hip  DVT prophylaxis: lovenox  Code Status:  DNR Family Communication: Discussed plan of care with the patient and family present at bedside Disposition Plan: To be determined Consults called: Orthopedic Admission status: Inpatient   Procedures:  operative procedure to fix her fracture  Antimicrobials:  None  DVT prophylaxis:     Objective: Vitals:   02/23/18 2300 02/24/18 0112 02/24/18 0348 02/24/18 1406  BP: 135/78 (!) 164/98 124/73 (!) 141/72  Pulse: 99 94 96 85  Resp: 15 18  18   Temp:  97.7 F (36.5 C) 98.2 F (36.8 C) 98.3 F (36.8 C)  TempSrc:  Oral Oral Oral  SpO2: 96% 96% 96% 94%    Intake/Output Summary (Last 24 hours) at 02/24/2018 2027 Last data filed at 02/24/2018 2016 Gross per 24 hour  Intake 400 ml  Output 750 ml  Net -350 ml   There were no vitals filed for this visit. There is no height or weight on file to calculate BMI.  Exam:  . General: 84 y.o.  year-old female well developed well nourished in no acute distress.  Alert and oriented x3. . Cardiovascular: Regular rate and rhythm with no rubs or gallops.  No thyromegaly or JVD noted.   Marland Kitchen Respiratory: Clear to auscultation with no wheezes or rales. Good inspiratory effort. . Abdomen: Soft nontender nondistended with normal bowel sounds x4 quadrants. . Musculoskeletal: No lower extremity edema. 2/4 pulses in all 4 extremities. . Skin: No ulcerative lesions noted or rashes, . Psychiatry: Mood is appropriate for condition and setting    Data Reviewed: CBC: Recent Labs  Lab 02/23/18 2047  WBC 11.3*  NEUTROABS 9.0*  HGB 10.7*  HCT 33.9*  MCV 100.6*  PLT 306   Basic Metabolic Panel: Recent Labs  Lab 02/23/18 2047  NA 135  K 3.9  CL 99  CO2 24  GLUCOSE 102*  BUN 21  CREATININE 0.97  CALCIUM 9.2   GFR: Estimated Creatinine Clearance: 25.2 mL/min (by C-G formula based on SCr of 0.97 mg/dL). Liver Function Tests: No results for input(s): AST, ALT, ALKPHOS, BILITOT, PROT, ALBUMIN in the last 168 hours. No results for input(s): LIPASE, AMYLASE in the last 168 hours. No results for input(s): AMMONIA in the last 168 hours. Coagulation Profile: Recent Labs  Lab 02/23/18 2047  INR 0.89   Cardiac Enzymes: No results for input(s): CKTOTAL, CKMB, CKMBINDEX, TROPONINI in the last 168 hours. BNP (last 3 results)  No results for input(s): PROBNP in the last 8760 hours. HbA1C: No results for input(s): HGBA1C in the last 72 hours. CBG: No results for input(s): GLUCAP in the last 168 hours. Lipid Profile: No results for input(s): CHOL, HDL, LDLCALC, TRIG, CHOLHDL, LDLDIRECT in the last 72 hours. Thyroid Function Tests: No results for input(s): TSH, T4TOTAL, FREET4, T3FREE, THYROIDAB in the last 72 hours. Anemia Panel: No results for input(s): VITAMINB12, FOLATE, FERRITIN, TIBC, IRON, RETICCTPCT in the last 72 hours. Urine analysis:    Component Value Date/Time   COLORURINE  YELLOW 10/30/2010 1601   APPEARANCEUR CLOUDY (A) 10/30/2010 1601   LABSPEC 1.005 10/30/2010 1601   PHURINE 8.0 10/30/2010 1601   GLUCOSEU NEGATIVE 10/30/2010 1601   HGBUR TRACE (A) 10/30/2010 1601   HGBUR trace-intact 03/25/2009 1547   BILIRUBINUR 1 mg/dL 16/12/9602 5409   KETONESUR NEGATIVE 10/30/2010 1601   PROTEINUR Positive (A) 02/13/2018 1410   PROTEINUR NEGATIVE 10/30/2010 1601   UROBILINOGEN 1.0 02/13/2018 1410   UROBILINOGEN 0.2 10/30/2010 1601   NITRITE Positive 02/13/2018 1410   NITRITE NEGATIVE 10/30/2010 1601   LEUKOCYTESUR Negative 02/13/2018 1410   Sepsis Labs: @LABRCNTIP (procalcitonin:4,lacticidven:4)  )No results found for this or any previous visit (from the past 240 hour(s)).    Studies: Dg Chest 1 View  Result Date: 02/23/2018 CLINICAL DATA:  Left hip pain after a fall today. EXAM: CHEST  1 VIEW COMPARISON:  02/13/2018 FINDINGS: Shallow inspiration with elevation of left hemidiaphragm. Linear atelectasis in the lung bases. Mild cardiac enlargement. No vascular congestion, edema, or consolidation. No blunting of costophrenic angles. No pneumothorax. Mediastinal contours appear intact. Similar appearance of the chest to previous study. IMPRESSION: Shallow inspiration with linear atelectasis in the lung bases. Mild cardiac enlargement. No evidence of active pulmonary disease. Electronically Signed   By: Burman Nieves M.D.   On: 02/23/2018 21:50   Dg Knee 1-2 Views Left  Result Date: 02/23/2018 CLINICAL DATA:  84 y/o  F; fall with left hip and knee pain. EXAM: LEFT KNEE - 1-2 VIEW COMPARISON:  None. FINDINGS: Bones are demineralized. No acute fracture or dislocation identified. No joint effusion. Vascular calcifications noted. Calcification with femorotibial compartments is compatible with chondrocalcinosis. IMPRESSION: 1. No acute fracture or dislocation identified. 2. Femorotibial compartments chondrocalcinosis. Electronically Signed   By: Mitzi Hansen  M.D.   On: 02/23/2018 20:50   Dg Hip Unilat With Pelvis 2-3 Views Left  Result Date: 02/23/2018 CLINICAL DATA:  84 y/o  F; fall with left hip and knee pain. EXAM: DG HIP (WITH OR WITHOUT PELVIS) 2-3V LEFT COMPARISON:  None. FINDINGS: Acute minimally displaced left femoral neck fracture. No dislocation of the femur joints. No acute pelvic fracture or diastasis. Bones are demineralized. IMPRESSION: Acute minimally displaced left femoral neck fracture. No joint dislocation. Electronically Signed   By: Mitzi Hansen M.D.   On: 02/23/2018 20:48    Scheduled Meds: . [MAR Hold] enoxaparin (LOVENOX) injection  40 mg Subcutaneous Daily  . povidone-iodine  2 application Topical Once    Continuous Infusions: . sodium chloride 75 mL/hr at 02/24/18 0100  .  ceFAZolin (ANCEF) IV       LOS: 1 day     Myrtie Neither, MD [facility]  To reach me or the doctor on call, go to: [email or web address] Password [email or web address]  02/24/2018, 8:27 PM

## 2018-02-24 NOTE — Anesthesia Preprocedure Evaluation (Signed)
Anesthesia Evaluation  Patient identified by MRN, date of birth, ID band Patient awake    Reviewed: Allergy & Precautions, NPO status , Patient's Chart, lab work & pertinent test results  Airway Mallampati: II  TM Distance: >3 FB Neck ROM: Full    Dental  (+) Edentulous Upper, Missing,    Pulmonary neg pulmonary ROS,    Pulmonary exam normal breath sounds clear to auscultation       Cardiovascular hypertension, Normal cardiovascular exam Rhythm:Regular Rate:Normal     Neuro/Psych  Headaches, negative psych ROS   GI/Hepatic Neg liver ROS, GERD  Medicated and Controlled,  Endo/Other  negative endocrine ROS  Renal/GU negative Renal ROS     Musculoskeletal   Abdominal   Peds  Hematology  (+) anemia ,   Anesthesia Other Findings left hip fracture  Reproductive/Obstetrics                             Anesthesia Physical Anesthesia Plan  ASA: III  Anesthesia Plan: Spinal   Post-op Pain Management:    Induction:   PONV Risk Score and Plan: 2 and Propofol infusion and Treatment may vary due to age or medical condition  Airway Management Planned: Natural Airway  Additional Equipment:   Intra-op Plan:   Post-operative Plan:   Informed Consent: I have reviewed the patients History and Physical, chart, labs and discussed the procedure including the risks, benefits and alternatives for the proposed anesthesia with the patient or authorized representative who has indicated his/her understanding and acceptance.   Dental advisory given  Plan Discussed with: CRNA  Anesthesia Plan Comments:         Anesthesia Quick Evaluation

## 2018-02-25 ENCOUNTER — Other Ambulatory Visit: Payer: Self-pay

## 2018-02-25 LAB — CBC
HCT: 31.7 % — ABNORMAL LOW (ref 36.0–46.0)
Hemoglobin: 10 g/dL — ABNORMAL LOW (ref 12.0–15.0)
MCH: 31.2 pg (ref 26.0–34.0)
MCHC: 31.5 g/dL (ref 30.0–36.0)
MCV: 98.8 fL (ref 80.0–100.0)
Platelets: 262 10*3/uL (ref 150–400)
RBC: 3.21 MIL/uL — ABNORMAL LOW (ref 3.87–5.11)
RDW: 12.7 % (ref 11.5–15.5)
WBC: 9.4 10*3/uL (ref 4.0–10.5)
nRBC: 0 % (ref 0.0–0.2)

## 2018-02-25 LAB — BASIC METABOLIC PANEL
Anion gap: 12 (ref 5–15)
BUN: 14 mg/dL (ref 8–23)
CALCIUM: 9 mg/dL (ref 8.9–10.3)
CO2: 23 mmol/L (ref 22–32)
Chloride: 100 mmol/L (ref 98–111)
Creatinine, Ser: 0.95 mg/dL (ref 0.44–1.00)
GFR calc Af Amer: 57 mL/min — ABNORMAL LOW (ref >60)
GFR calc non Af Amer: 49 mL/min — ABNORMAL LOW (ref >60)
Glucose, Bld: 155 mg/dL — ABNORMAL HIGH (ref 70–99)
Potassium: 4.2 mmol/L (ref 3.5–5.1)
Sodium: 135 mmol/L (ref 135–145)

## 2018-02-25 NOTE — Care Management (Signed)
D/W d/c plan with patient's son, Kathlene NovemberMike and his wife.  Pt is confused today per son.  They want patient to go to SNF for therapy and prefer, [facility], [facility], or [facility] being first choice.  Kathlene NovemberMike will be the decision-maker and will be accessible by phone at [phone].

## 2018-02-25 NOTE — Op Note (Signed)
NAME: Lowella BandyROBERSON, Druscilla C. MEDICAL RECORD [id number] ACCOUNT [id number] DATE OF BIRTH:March 05, 1917 FACILITY: [facility] LOCATION: [facility] PHYSICIAN:Sheretta Grumbine Starling MannsL. Janelie Goltz, MD  OPERATIVE REPORT  DATE OF PROCEDURE:  02/24/2018  PREOPERATIVE DIAGNOSIS:  Femoral neck fracture, left.  POSTOPERATIVE DIAGNOSIS:  Femoral neck fracture, left.  PROCEDURE:  Left hemiarthroplasty with a [place] basic stem, a minus neck ball and a 43 mm head ball.  SURGEON:  Jodi GeraldsJohn Shaney Deckman, MD  ASSISTANT:  Sophronia SimasScott Bethune, M.D.  ANESTHESIA:  Spinal.  BRIEF HISTORY:  The patient is a 84 year old female who is actually fairly active.  She fell over backwards and broke her left hip.  She was admitted to medicine service, cleared for surgery and taken to the operating room for left hemiarthroplasty.  DESCRIPTION OF PROCEDURE:  The patient was taken to the operating room after adequate anesthesia was obtained with a spinal anesthetic placed supine on the operating table and moved into the right lateral decubitus position.  All bony prominences well  padded.  Attention turned to the left hip where after routine prep and drape, incision made for posterior approach to the hip subcutaneous to the level of the tensor fascia was divided in line with its fibers.  A Charnley retractor was put in place.   Cobra retractor put in place above and below the neck and the short external rotators and piriformis were taken down as a group.  These were tagged.  A provisional neck cut was made followed by removal of the head followed by sequential irrigation and  suctioning of the acetabulum and a size 43 ball as it was measured on the back table was put in and excellent suction fit and range of motion.  Attention was then turned to the stem side sequentially rasped up to a level of 3.  The 3 fit great.   Excellent stability.  At that point, a 3 was opened and placed.  The -0 ball 43 was trialed again.  Excellent range of motion and stability were  achieved.  Short external rotators and piriformis were reattached to the posterior trochanteric line through  drill holes.  The tensor fascia was closed with #1 Vicryl running, skin with 0 and 2-0 Vicryl and 3-0 Monocryl subcuticular.  Benzoin, Steri-Strips, dry sterile compressive dressing was applied.  The patient was taken to recovery room to be in  satisfactory condition.  Estimated blood loss for procedure was 150 mL.  TN/NUANCE  D:02/24/2018 T:02/25/2018 JOB:[id number]

## 2018-02-25 NOTE — Progress Notes (Signed)
PROGRESS NOTE  Chloe Cooper OZD:[id number] DOB: 1916/06/10 DOA: 02/23/2018 PCP: Judy Pimple, MD  HPI/Recap of past 62 hours:  84 year old female with sparse medical history of reflux osteoporosis and osteoarthritis diverticulitis hyperlipoidemia Admitted to [facility] 12/19 with accidental fall and found to have minimally displaced left femoral neck fracture labs are relatively normal Orthopedics Dr. Luiz Blare consulted She was transferred from [facility] due to femoral neck fracture for surgical fixation today  Assessment/Plan: Principal Problem:   Femoral neck fracture (HCC) Active Problems:   Fall at home, initial encounter   Hematoma of left parietal scalp   Prolonged QT interval   Pressure injury of skin  1.  Left femoral neck fracture secondary to fall at home.  Status post surgery 12/20-stable at this time pain is reasonably controlled she is in immobilizer needs to work with therapy I anticipate she will need to probably go to SNF and mention this to her and her family  2.  GERD, PPI held due to prolonged QT interval  3.  Left scalp hematoma which is as a result of the fall CT scan imaging of the brain showed no intracranial bleed.  4.  Recent fall at home resulting in femoral neck fracture of the left hip  DVT prophylaxis: lovenox  Code Status:  DNR Family Communication:  Discussed with son and daughter-in-law Disposition Plan: To be determined Consults called: Orthopedic Admission status: Inpatient   Procedures:  operative procedure to fix her fracture  Antimicrobials:  None  DVT prophylaxis:     Objective: Vitals:   02/24/18 2125 02/24/18 2132 02/24/18 2206 02/25/18 0551  BP:  (!) 113/56 118/61 (P) 127/67  Pulse: 90 83 80 (!) (P) 103  Resp: 15 15 14    Temp:  (!) 97.5 F (36.4 C) 97.8 F (36.6 C) (P) 97.9 F (36.6 C)  TempSrc:   Oral (P) Oral  SpO2: 98% 100% 98% (P) 96%    Intake/Output Summary (Last 24 hours) at 02/25/2018  0739 Last data filed at 02/25/2018 0600 Gross per 24 hour  Intake 1159.27 ml  Output 1400 ml  Net -240.73 ml   There were no vitals filed for this visit. There is no height or weight on file to calculate BMI.  Exam:  Alert pleasant no distress Coherent Very hard of hearing S1-S2 no murmur Abdomen soft nontender no rebound No lower extremity edema Left lower extremity in immobilizer    Data Reviewed: CBC: Recent Labs  Lab 02/23/18 2047 02/25/18 0232  WBC 11.3* 9.4  NEUTROABS 9.0*  --   HGB 10.7* 10.0*  HCT 33.9* 31.7*  MCV 100.6* 98.8  PLT 306 262   Basic Metabolic Panel: Recent Labs  Lab 02/23/18 2047 02/25/18 0232  NA 135 135  K 3.9 4.2  CL 99 100  CO2 24 23  GLUCOSE 102* 155*  BUN 21 14  CREATININE 0.97 0.95  CALCIUM 9.2 9.0   GFR: Estimated Creatinine Clearance: 25.7 mL/min (by C-G formula based on SCr of 0.95 mg/dL). Liver Function Tests: No results for input(s): AST, ALT, ALKPHOS, BILITOT, PROT, ALBUMIN in the last 168 hours. No results for input(s): LIPASE, AMYLASE in the last 168 hours. No results for input(s): AMMONIA in the last 168 hours. Coagulation Profile: Recent Labs  Lab 02/23/18 2047  INR 0.89   Cardiac Enzymes: No results for input(s): CKTOTAL, CKMB, CKMBINDEX, TROPONINI in the last 168 hours. BNP (last 3 results) No results for input(s): PROBNP in the last 8760 hours. HbA1C:  No results for input(s): HGBA1C in the last 72 hours. CBG: No results for input(s): GLUCAP in the last 168 hours. Lipid Profile: No results for input(s): CHOL, HDL, LDLCALC, TRIG, CHOLHDL, LDLDIRECT in the last 72 hours. Thyroid Function Tests: No results for input(s): TSH, T4TOTAL, FREET4, T3FREE, THYROIDAB in the last 72 hours. Anemia Panel: No results for input(s): VITAMINB12, FOLATE, FERRITIN, TIBC, IRON, RETICCTPCT in the last 72 hours. Urine analysis:    Component Value Date/Time   COLORURINE YELLOW 10/30/2010 1601   APPEARANCEUR CLOUDY (A)  10/30/2010 1601   LABSPEC 1.005 10/30/2010 1601   PHURINE 8.0 10/30/2010 1601   GLUCOSEU NEGATIVE 10/30/2010 1601   HGBUR TRACE (A) 10/30/2010 1601   HGBUR trace-intact 03/25/2009 1547   BILIRUBINUR 1 mg/dL 78/29/5621 3086   KETONESUR NEGATIVE 10/30/2010 1601   PROTEINUR Positive (A) 02/13/2018 1410   PROTEINUR NEGATIVE 10/30/2010 1601   UROBILINOGEN 1.0 02/13/2018 1410   UROBILINOGEN 0.2 10/30/2010 1601   NITRITE Positive 02/13/2018 1410   NITRITE NEGATIVE 10/30/2010 1601   LEUKOCYTESUR Negative 02/13/2018 1410   Sepsis Labs: @LABRCNTIP (procalcitonin:4,lacticidven:4)  )No results found for this or any previous visit (from the past 240 hour(s)).    Studies: Pelvis Portable  Result Date: 02/24/2018 CLINICAL DATA:  Status post left hemiarthroplasty EXAM: PORTABLE PELVIS 1-2 VIEWS COMPARISON:  02/23/2018 FINDINGS: Interval left hip arthroplasty. Pubic symphysis and rami are intact. Gas in the soft tissues consistent with recent surgery. Vascular calcifications. IMPRESSION: Interval left hip arthroplasty with expected surgical changes Electronically Signed   By: Jasmine Pang M.D.   On: 02/24/2018 21:42    Scheduled Meds: . acetaminophen  500 mg Oral Q6H  . aspirin EC  325 mg Oral Q breakfast  . docusate sodium  100 mg Oral BID    Continuous Infusions: . sodium chloride 75 mL/hr at 02/24/18 0100  .  ceFAZolin (ANCEF) IV 2 g (02/25/18 0247)     LOS: 2 days     Pleas Koch, MD [facility] 7:39 AM  To reach me or the doctor on call, go to: [email or web address] Password [facility]  02/25/2018, 7:39 AM

## 2018-02-25 NOTE — Plan of Care (Signed)
  Problem: Coping: Goal: Level of anxiety will decrease Outcome: Progressing   Problem: Safety: Goal: Ability to remain free from injury will improve Outcome: Progressing   Problem: Skin Integrity: Goal: Risk for impaired skin integrity will decrease Outcome: Progressing   

## 2018-02-25 NOTE — Evaluation (Signed)
Physical Therapy Evaluation Patient Details Name: Chloe Cooper MRN: [id number] DOB: Apr 17, 1916 Today's Date: 02/25/2018   History of Present Illness  Patient fell and suffered a left hip fx. She had a posterior hemiarthroplasty on 02/24/2018. She alos has a head contusion and left hip pain. She reports baseline right foot and right knee pain. She is currently in an imobilizer. NUU:VOZDG wrist fx; osteoperosis, GERD, DDD, cataracts, bakers cyst   Clinical Impression  Patient is a very pleasant female presenting S/P posterior hemi-arthroplasty on 02/24/2018. Posterior precautions were reviewed but may need to be reviewed again 2nd to mild confusion. She required significant assist for all transfers. She was unable to ambualte 2nd to pain. She would benefit from continued skilled acute therapy and rehab at a SNF.      Follow Up Recommendations SNF    Equipment Recommendations  None recommended by PT    Recommendations for Other Services Rehab consult     Precautions / Restrictions Precautions Precautions: Posterior Hip;Fall Precaution Comments: given sheet for posterior percaustions. Also discussed with family  Required Braces or Orthoses: Knee Immobilizer - Left Knee Immobilizer - Left: On at all times Restrictions Weight Bearing Restrictions: Yes LLE Weight Bearing: Weight bearing as tolerated      Mobility  Bed Mobility Overal bed mobility: Needs Assistance Bed Mobility: Sidelying to Sit;Supine to Sit   Sidelying to sit: Max assist;+2 for physical assistance       General bed mobility comments: Max a to control elft LE and Amx a to sit patient up at the edge of the bed   Transfers Overall transfer level: Needs assistance   Transfers: Stand Pivot Transfers;Sit to/from Stand Sit to Stand: Max assist;+2 physical assistance Stand pivot transfers: Max assist;+2 physical assistance       General transfer comment: transfer limited by pain. Patoent reported increased  pain weight bearing on the right LE   Ambulation/Gait                Stairs            Wheelchair Mobility    Modified Rankin (Stroke Patients Only)       Balance                                             Pertinent Vitals/Pain      Home Living Family/patient expects to be discharged to:: Private residence Living Arrangements: Alone Available Help at Discharge: Family Type of Home: House Home Access: Ramped entrance     Home Layout: One level   Additional Comments: Patient has someone stay with her at night. She is otherwise alone.     Prior Function Level of Independence: Independent with assistive device(s)         Comments: Used a walker in the house. When she left the house she left with somebody and used a cane.      Hand Dominance   Dominant Hand: Right    Extremity/Trunk Assessment   Upper Extremity Assessment Upper Extremity Assessment: Overall WFL for tasks assessed    Lower Extremity Assessment Lower Extremity Assessment: LLE deficits/detail LLE: Unable to fully assess due to immobilization;Unable to fully assess due to pain    Cervical / Trunk Assessment Cervical / Trunk Assessment: Normal  Communication   Communication: No difficulties  Cognition Arousal/Alertness: Awake/alert Behavior During Therapy: WFL for tasks assessed/performed Overall  Cognitive Status: Within Functional Limits for tasks assessed  Mild confusion noted at times but patient able to follow all commands                                         General Comments      Exercises     Assessment/Plan    PT Assessment Patient needs continued PT services  PT Problem List Decreased strength;Decreased range of motion;Decreased activity tolerance;Decreased balance;Decreased mobility;Decreased safety awareness       PT Treatment Interventions DME instruction;Gait training;Stair training;Functional mobility  training;Therapeutic activities;Therapeutic exercise    PT Goals (Current goals can be found in the Care Plan section)  Acute Rehab PT Goals Patient Stated Goal: to have less pain  PT Goal Formulation: With patient Time For Goal Achievement: 03/04/18 Potential to Achieve Goals: Good    Frequency Min 5X/week   Barriers to discharge Inaccessible home environment      Co-evaluation               AM-PAC PT "6 Clicks" Mobility  Outcome Measure Help needed turning from your back to your side while in a flat bed without using bedrails?: Total Help needed moving from lying on your back to sitting on the side of a flat bed without using bedrails?: Total Help needed moving to and from a bed to a chair (including a wheelchair)?: Total Help needed standing up from a chair using your arms (e.g., wheelchair or bedside chair)?: Total Help needed to walk in hospital room?: Total Help needed climbing 3-5 steps with a railing? : Total 6 Click Score: 6    End of Session Equipment Utilized During Treatment: Gait belt Activity Tolerance: Patient limited by pain Patient left: in chair;with call bell/phone within reach;with chair alarm set;with family/visitor present Nurse Communication: Mobility status PT Visit Diagnosis: Unsteadiness on feet (R26.81);Repeated falls (R29.6);Pain Pain - Right/Left: Left Pain - part of body: Hip    Time: 6578-4696 PT Time Calculation (min) (ACUTE ONLY): 33 min   Charges:   PT Evaluation $PT Eval Moderate Complexity: 1 Mod            Dessie Coma PT DPT  02/25/2018, 12:22 PM

## 2018-02-25 NOTE — Plan of Care (Signed)

## 2018-02-25 NOTE — Progress Notes (Signed)
   PATIENT ID: Chloe Cooper   1 Day Post-Op Procedure(s) (LRB): ARTHROPLASTY (HEMIARTHROPLASTY) non cement (Left)  Subjective: Doing well, minimal pain in left hip. Hopes to get up with PT today. Hopes to go home.   Objective:  Vitals:   02/24/18 2206 02/25/18 0551  BP: 118/61 (P) 127/67  Pulse: 80 (!) (P) 103  Resp: 14   Temp: 97.8 F (36.6 C) (P) 97.9 F (36.6 C)  SpO2: 98% (P) 96%     L hip dressing c/d/i Wiggles toes, distally NVI Calves soft, nontender  Labs:  Recent Labs    02/23/18 2047 02/25/18 0232  HGB 10.7* 10.0*   Recent Labs    02/23/18 2047 02/25/18 0232  WBC 11.3* 9.4  RBC 3.37* 3.21*  HCT 33.9* 31.7*  PLT 306 262   Recent Labs    02/23/18 2047 02/25/18 0232  NA 135 135  K 3.9 4.2  CL 99 100  CO2 24 23  BUN 21 14  CREATININE 0.97 0.95  GLUCOSE 102* 155*  CALCIUM 9.2 9.0    Assessment and Plan: 1 day s/p L hip hemiarthoplasty WBAT Up with PT today Likely needs SNF but hopes to d/c home, will wait for PT recs Minimize pain meds  VTE proph: asa, scds

## 2018-02-26 LAB — CBC
HEMATOCRIT: 27.5 % — AB (ref 36.0–46.0)
Hemoglobin: 8.8 g/dL — ABNORMAL LOW (ref 12.0–15.0)
MCH: 30.9 pg (ref 26.0–34.0)
MCHC: 32 g/dL (ref 30.0–36.0)
MCV: 96.5 fL (ref 80.0–100.0)
Platelets: 235 10*3/uL (ref 150–400)
RBC: 2.85 MIL/uL — ABNORMAL LOW (ref 3.87–5.11)
RDW: 12.6 % (ref 11.5–15.5)
WBC: 10.2 10*3/uL (ref 4.0–10.5)
nRBC: 0 % (ref 0.0–0.2)

## 2018-02-26 NOTE — Progress Notes (Signed)
PROGRESS NOTE  Chloe Cooper ZDG:[id number] DOB: 11/23/1916 DOA: 02/23/2018 PCP: Judy Pimple, MD  HPI/Recap of past 23 hours:  84 year old female with sparse medical history of reflux osteoporosis and osteoarthritis diverticulitis hyperlipoidemia Admitted to [facility] 12/19 with accidental fall and found to have minimally displaced left femoral neck fracture labs are relatively normal Orthopedics Dr. Luiz Blare consulted She was transferred from [facility] due to femoral neck fracture for surgical fixation today  Assessment/Plan: Principal Problem:   Femoral neck fracture (HCC) Active Problems:   Fall at home, initial encounter   Hematoma of left parietal scalp   Prolonged QT interval   Pressure injury of skin  1.  Left femoral neck fracture secondary to fall at home.  Status post surgery 12/20-stable at this time pain is reasonably controlled she is in immobilizer needs to work with therapy Expect will need postop aspirin for next 4 to 6 weeks and pain control going forward has not been out of bed today Currently stabilizing and may be able to go to skilled facility in the next day or so  2.  GERD, PPI held due to prolonged QT interval  3.  Left scalp hematoma which is as a result of the fall CT scan imaging of the brain showed no intracranial bleed.  4.  Recent fall at home resulting in femoral neck fracture of the left hip  5.   Expected anemia of blood loss from surgery hemoglobin slightly down from 10-8-recheck in a.m  6-mild postop delirium-seemingly resolved now able to orient to time place person-anticipate readiness for discharge to facility when able.   DVT prophylaxis: lovenox  Code Status:  DNR Family Communication:  Discussed with son and daughter-in-law Disposition Plan:  likely skilled facility in the next day or so Consults called: Orthopedic Admission status: Inpatient   Procedures:  operative procedure to fix her  fracture  Antimicrobials:  None  DVT prophylaxis:     Objective: Vitals:   02/25/18 0800 02/25/18 1614 02/25/18 2018 02/26/18 0525  BP:  108/89 (!) 110/58 132/78  Pulse:  90 98 98  Resp:  16    Temp:  98.4 F (36.9 C) 98.4 F (36.9 C)   TempSrc:  Oral Oral   SpO2:  97% 95% 95%  Weight: 56 kg     Height: 5' 0.5" (1.537 m)       Intake/Output Summary (Last 24 hours) at 02/26/2018 0936 Last data filed at 02/25/2018 1754 Gross per 24 hour  Intake 660 ml  Output 1100 ml  Net -440 ml   Filed Weights   02/25/18 0800  Weight: 56 kg   Body mass index is 23.71 kg/m.  Exam:  Alert pleasant no distress not disoriented coherent Very hard of hearing S1-S2 no murmur Abdomen soft nontender no rebound No lower extremity edema Left lower extremity in immobilizer    Data Reviewed: CBC: Recent Labs  Lab 02/23/18 2047 02/25/18 0232 02/26/18 0330  WBC 11.3* 9.4 10.2  NEUTROABS 9.0*  --   --   HGB 10.7* 10.0* 8.8*  HCT 33.9* 31.7* 27.5*  MCV 100.6* 98.8 96.5  PLT 306 262 235   Basic Metabolic Panel: Recent Labs  Lab 02/23/18 2047 02/25/18 0232  NA 135 135  K 3.9 4.2  CL 99 100  CO2 24 23  GLUCOSE 102* 155*  BUN 21 14  CREATININE 0.97 0.95  CALCIUM 9.2 9.0   GFR: Estimated Creatinine Clearance: 23.8 mL/min (by C-G formula based on SCr  of 0.95 mg/dL). Liver Function Tests: No results for input(s): AST, ALT, ALKPHOS, BILITOT, PROT, ALBUMIN in the last 168 hours. No results for input(s): LIPASE, AMYLASE in the last 168 hours. No results for input(s): AMMONIA in the last 168 hours. Coagulation Profile: Recent Labs  Lab 02/23/18 2047  INR 0.89   Cardiac Enzymes: No results for input(s): CKTOTAL, CKMB, CKMBINDEX, TROPONINI in the last 168 hours. BNP (last 3 results) No results for input(s): PROBNP in the last 8760 hours. HbA1C: No results for input(s): HGBA1C in the last 72 hours. CBG: No results for input(s): GLUCAP in the last 168 hours. Lipid  Profile: No results for input(s): CHOL, HDL, LDLCALC, TRIG, CHOLHDL, LDLDIRECT in the last 72 hours. Thyroid Function Tests: No results for input(s): TSH, T4TOTAL, FREET4, T3FREE, THYROIDAB in the last 72 hours. Anemia Panel: No results for input(s): VITAMINB12, FOLATE, FERRITIN, TIBC, IRON, RETICCTPCT in the last 72 hours. Urine analysis:    Component Value Date/Time   COLORURINE YELLOW 10/30/2010 1601   APPEARANCEUR CLOUDY (A) 10/30/2010 1601   LABSPEC 1.005 10/30/2010 1601   PHURINE 8.0 10/30/2010 1601   GLUCOSEU NEGATIVE 10/30/2010 1601   HGBUR TRACE (A) 10/30/2010 1601   HGBUR trace-intact 03/25/2009 1547   BILIRUBINUR 1 mg/dL 56/21/3086 5784   KETONESUR NEGATIVE 10/30/2010 1601   PROTEINUR Positive (A) 02/13/2018 1410   PROTEINUR NEGATIVE 10/30/2010 1601   UROBILINOGEN 1.0 02/13/2018 1410   UROBILINOGEN 0.2 10/30/2010 1601   NITRITE Positive 02/13/2018 1410   NITRITE NEGATIVE 10/30/2010 1601   LEUKOCYTESUR Negative 02/13/2018 1410   Sepsis Labs: @LABRCNTIP (procalcitonin:4,lacticidven:4)  )No results found for this or any previous visit (from the past 240 hour(s)).    Studies: No results found.  Scheduled Meds: . aspirin EC  325 mg Oral Q breakfast  . docusate sodium  100 mg Oral BID    Continuous Infusions: . sodium chloride Stopped (02/25/18 1100)     LOS: 3 days     Pleas Koch, MD [facility] 9:36 AM  To reach me or the doctor on call, go to: [email or web address] Password [facility]  02/26/2018, 9:36 AM

## 2018-02-26 NOTE — NC FL2 (Signed)
[place] MEDICAID FL2 LEVEL OF CARE SCREENING TOOL     IDENTIFICATION  Patient Name: Chloe Cooper Birthdate: [birth year] Sex: female Admission Date (Current Location): 02/23/2018  [facility] and [place] Number:  Producer, television/film/video and Address:  [facility] [place]. [facility], [address] [place], [place] [address]      Provider Number: [id number]  Attending Physician Name and Address:  Rhetta Mura, MD  Relative Name and Phone Number:       Current Level of Care: Hospital Recommended Level of Care: Skilled Nursing Facility Prior Approval Number:    Date Approved/Denied:   PASRR Number:    Discharge Plan: SNF    Current Diagnoses: Patient Active Problem List   Diagnosis Date Noted  . Fall at home, initial encounter 02/24/2018  . Hematoma of left parietal scalp 02/24/2018  . Prolonged QT interval 02/24/2018  . Pressure injury of skin 02/24/2018  . Femoral neck fracture (HCC) 02/23/2018  . Productive cough 02/13/2018  . Abdominal pain, left lower quadrant 02/13/2018  . Acute cystitis without hematuria 08/26/2017  . Intertrigo 04/19/2017  . B12 deficiency 12/24/2015  . Anemia 11/25/2015  . Burning sensation of mouth 04/22/2015  . Lumbar pain 10/30/2014  . Bilateral knee pain 10/30/2014  . Ingrown toenail 07/17/2014  . Encounter for Medicare annual wellness exam 02/23/2013  . Headache 06/09/2012  . Recurrent UTI 05/25/2011  . Back pain, thoracic 11/11/2010  . Thoracic back pain 11/07/2010  . Constipation 09/30/2010  . Osteoarthritis 08/21/2010  . Vitamin D deficiency 10/08/2008  . ESSENTIAL HYPERTENSION, BENIGN 10/02/2008  . HYPERCHOLESTEROLEMIA 07/19/2006  . Osteoporosis 07/19/2006    Orientation RESPIRATION BLADDER Height & Weight     Self, Situation  Normal Continent Weight: 123 lb 7.3 oz (56 kg) Height:  5' 0.5" (153.7 cm)  BEHAVIORAL SYMPTOMS/MOOD NEUROLOGICAL BOWEL NUTRITION STATUS      Continent Diet(regular)  AMBULATORY  STATUS COMMUNICATION OF NEEDS Skin   Limited Assist Verbally Other (Comment)(Stage 1 pressure ulcer- Coccxy, Closed incision- left hip)                       Personal Care Assistance Level of Assistance  Bathing, Feeding, Dressing Bathing Assistance: Limited assistance Feeding assistance: Independent Dressing Assistance: Limited assistance     Functional Limitations Info             SPECIAL CARE FACTORS FREQUENCY  OT (By licensed OT), PT (By licensed PT)     PT Frequency: 5 OT Frequency: 5            Contractures      Additional Factors Info  Code Status, Allergies Code Status Info: DNR Allergies Info: ALENDRONATE SODIUM, PENICILLINS, SIMVASTATIN, SULFONAMIDE DERIVATIVES            Current Medications (02/26/2018):  This is the current hospital active medication list Current Facility-Administered Medications  Medication Dose Route Frequency Provider Last Rate Last Dose  . 0.9 %  sodium chloride infusion   Intravenous Continuous Marshia Ly, PA-C   Stopped at 02/25/18 1100  . acetaminophen (TYLENOL) tablet 325-650 mg  325-650 mg Oral Q6H PRN Marshia Ly, PA-C      . albuterol (PROVENTIL) (2.5 MG/3ML) 0.083% nebulizer solution 2.5 mg  2.5 mg Nebulization Q4H PRN Marshia Ly, PA-C      . alum & mag hydroxide-simeth (MAALOX/MYLANTA) 200-200-20 MG/5ML suspension 30 mL  30 mL Oral Q4H PRN Marshia Ly, PA-C      . aspirin EC tablet 325  mg  325 mg Oral Q breakfast Marshia Ly, PA-C   325 mg at 02/26/18 1021  . docusate sodium (COLACE) capsule 100 mg  100 mg Oral BID Marshia Ly, PA-C   100 mg at 02/26/18 1021  . fentaNYL (SUBLIMAZE) injection 25-50 mcg  25-50 mcg Intravenous Q3H PRN Marshia Ly, PA-C   50 mcg at 02/24/18 2952  . fluticasone (FLONASE) 50 MCG/ACT nasal spray 1 spray  1 spray Each Nare Daily PRN Marshia Ly, PA-C      . HYDROcodone-acetaminophen (NORCO/VICODIN) 5-325 MG per tablet 1 tablet  1 tablet Oral Q6H PRN Marshia Ly, PA-C    1 tablet at 02/26/18 1021  . lip balm (CARMEX) ointment 1 application  1 application Topical PRN Marshia Ly, PA-C      . ondansetron [facility]) tablet 4 mg  4 mg Oral Q6H PRN Marshia Ly, PA-C       Or  . ondansetron [facility]) injection 4 mg  4 mg Intravenous Q6H PRN Marshia Ly, PA-C      . phenol (CHLORASEPTIC) mouth spray 1 spray  1 spray Mouth/Throat PRN Marshia Ly, PA-C      . polyvinyl alcohol (LIQUIFILM TEARS) 1.4 % ophthalmic solution 1 drop  1 drop Both Eyes PRN Marshia Ly, PA-C      . sodium chloride (OCEAN) 0.65 % nasal spray 1 spray  1 spray Each Nare PRN Marshia Ly, PA-C         Discharge Medications: Please see discharge summary for a list of discharge medications.  Relevant Imaging Results:  Relevant Lab Results:   Additional Information SS#: [id number]  Donnie Coffin, LCSW

## 2018-02-26 NOTE — Progress Notes (Signed)
   PATIENT ID: Chloe Cooper   2 Days Post-Op Procedure(s) (LRB): ARTHROPLASTY (HEMIARTHROPLASTY) non cement (Left)  Subjective: Doing well this am. Min pain in left hip. Up with PT this am, walked from bed to chair and back.   Objective:  Vitals:   02/25/18 2018 02/26/18 0525  BP: (!) 110/58 132/78  Pulse: 98 98  Resp:    Temp: 98.4 F (36.9 C)   SpO2: 95% 95%     L hip dressing c/d/i Wiggles toes, distally NVI Calves soft, nontender  Labs:  Recent Labs    02/23/18 2047 02/25/18 0232 02/26/18 0330  HGB 10.7* 10.0* 8.8*   Recent Labs    02/25/18 0232 02/26/18 0330  WBC 9.4 10.2  RBC 3.21* 2.85*  HCT 31.7* 27.5*  PLT 262 235   Recent Labs    02/23/18 2047 02/25/18 0232  NA 135 135  K 3.9 4.2  CL 99 100  CO2 24 23  BUN 21 14  CREATININE 0.97 0.95  GLUCOSE 102* 155*  CALCIUM 9.2 9.0    Assessment and Plan: 2 days s/p L hip hemiarthoplasty WBAT Up with PT today SNF placement in the works by social work Minimize pain meds  VTE proph: asa, scds

## 2018-02-26 NOTE — Plan of Care (Signed)
  Problem: Activity: Goal: Risk for activity intolerance will decrease Outcome: Progressing   Problem: Nutrition: Goal: Adequate nutrition will be maintained Outcome: Progressing   Problem: Coping: Goal: Level of anxiety will decrease Outcome: Progressing   Problem: Pain Managment: Goal: General experience of comfort will improve Outcome: Progressing   

## 2018-02-26 NOTE — Progress Notes (Signed)
Physical Therapy Treatment Patient Details Name: Chloe Cooper MRN: [id number] DOB: December 23, 1916 Today's Date: 02/26/2018    History of Present Illness Patient fell and suffered a left hip fx. She had a posterior hemiarthroplasty on 02/24/2018. She alos has a head contusion and left hip pain. She reports baseline right foot and right knee pain. She is currently in an imobilizer. CVE:LFYBO wrist fx; osteoperosis, GERD, DDD, cataracts, bakers cyst     PT Comments    Pt making good progress. Continue to recommend SNF for ongoing Physical Therapy.   Pt and family in agreement.   Follow Up Recommendations  SNF;Supervision for mobility/OOB     Equipment Recommendations  None recommended by PT    Recommendations for Other Services       Precautions / Restrictions Precautions Precautions: Posterior Hip;Fall Required Braces or Orthoses: Knee Immobilizer - Left Restrictions Weight Bearing Restrictions: Yes LLE Weight Bearing: Weight bearing as tolerated    Mobility  Bed Mobility Overal bed mobility: Needs Assistance Bed Mobility: Supine to Sit     Supine to sit: Mod assist;+2 for physical assistance        Transfers Overall transfer level: Needs assistance   Transfers: Sit to/from Stand Sit to Stand: Min assist;From elevated surface         General transfer comment: Cues for safest and easiest technique  Ambulation/Gait Ambulation/Gait assistance: Min assist Gait Distance (Feet): 8 Feet(walked 8 ft twice) Assistive device: Rolling walker (2 wheeled) Gait Pattern/deviations: Step-to pattern;Decreased stride length;Antalgic         Stairs             Wheelchair Mobility    Modified Rankin (Stroke Patients Only)       Balance                                            Cognition Arousal/Alertness: Awake/alert Behavior During Therapy: WFL for tasks assessed/performed Overall Cognitive Status: Within Functional Limits for tasks  assessed                                        Exercises Total Joint Exercises Ankle Circles/Pumps: AROM;Both;10 reps;Supine Hip ABduction/ADduction: AAROM;Left;10 reps;Supine    General Comments General comments (skin integrity, edema, etc.): Family present at end of session and with pts permission updated on her PT session      Pertinent Vitals/Pain      Home Living                      Prior Function            PT Goals (current goals can now be found in the care plan section) Progress towards PT goals: Progressing toward goals    Frequency    Min 5X/week      PT Plan Current plan remains appropriate    Co-evaluation              AM-PAC PT "6 Clicks" Mobility   Outcome Measure  Help needed turning from your back to your side while in a flat bed without using bedrails?: A Lot Help needed moving from lying on your back to sitting on the side of a flat bed without using bedrails?: A Lot Help needed moving to and from a bed  to a chair (including a wheelchair)?: A Little Help needed standing up from a chair using your arms (e.g., wheelchair or bedside chair)?: A Little Help needed to walk in hospital room?: A Little Help needed climbing 3-5 steps with a railing? : Total 6 Click Score: 14    End of Session Equipment Utilized During Treatment: Gait belt;Left knee immobilizer Activity Tolerance: Patient tolerated treatment well Patient left: in chair;with call bell/phone within reach;with chair alarm set;with family/visitor present Nurse Communication: Mobility status PT Visit Diagnosis: Unsteadiness on feet (R26.81)     Time: 5409-8119 PT Time Calculation (min) (ACUTE ONLY): 22 min  Charges:  $Gait Training: 8-22 mins                     Lavona Mound, PT   Acute Rehabilitation Services  Pager [phone] Office [phone] 02/26/2018    Donnella Sham 02/26/2018, 2:51 PM

## 2018-02-26 NOTE — Progress Notes (Signed)
CSW lvm with son Maurine MinisterDennis for assessment of SNF options. CSW will continue to follow up with family for discharge planning due to patient's lack of orientation and memory impairments.   AllianceAshley Breyonna [place], [place] [phone]

## 2018-02-27 ENCOUNTER — Encounter (HOSPITAL_COMMUNITY): Payer: Self-pay | Admitting: Orthopedic Surgery

## 2018-02-27 LAB — CBC
HCT: 26.9 % — ABNORMAL LOW (ref 36.0–46.0)
Hemoglobin: 8.5 g/dL — ABNORMAL LOW (ref 12.0–15.0)
MCH: 31.4 pg (ref 26.0–34.0)
MCHC: 31.6 g/dL (ref 30.0–36.0)
MCV: 99.3 fL (ref 80.0–100.0)
PLATELETS: 243 10*3/uL (ref 150–400)
RBC: 2.71 MIL/uL — ABNORMAL LOW (ref 3.87–5.11)
RDW: 13 % (ref 11.5–15.5)
WBC: 8.5 10*3/uL (ref 4.0–10.5)
nRBC: 0.2 % (ref 0.0–0.2)

## 2018-02-27 MED ORDER — POLYETHYLENE GLYCOL 3350 17 G PO PACK: 17.0000 g | PACK | Freq: Every day | ORAL | 0 refills | Status: DC

## 2018-02-27 MED ORDER — DOCUSATE SODIUM 100 MG PO CAPS: 100.0000 mg | ORAL_CAPSULE | Freq: Two times a day (BID) | ORAL | 0 refills | Status: DC

## 2018-02-27 MED ORDER — POLYETHYLENE GLYCOL 3350 17 G PO PACK
17.0000 g | PACK | Freq: Every day | ORAL | Status: DC
  Administered 2018-02-27 – 2018-02-28 (×2): 17 g via ORAL
  Filled 2018-02-27 (×2): qty 1

## 2018-02-27 NOTE — Progress Notes (Signed)
Subjective: 3 Days Post-Op Procedure(s) (LRB): ARTHROPLASTY (HEMIARTHROPLASTY) non cement (Left) Patient reports pain as mild.  Patient's son at bedside.Ambulated in room yesterday with physical therapy.No dizziness.  Objective: Vital signs in last 24 hours: Temp:  [98.1 F (36.7 C)-98.7 F (37.1 C)] 98.2 F (36.8 C) (12/23 0524) Pulse Rate:  [78-99] 78 (12/23 0524) Resp:  [14-18] 14 (12/23 0524) BP: (118-126)/(53-57) 118/53 (12/23 0524) SpO2:  [93 %-98 %] 93 % (12/23 0524)  Intake/Output from previous day: No intake/output data recorded. Intake/Output this shift: No intake/output data recorded.  Recent Labs    02/25/18 0232 02/26/18 0330 02/27/18 0246  HGB 10.0* 8.8* 8.5*   Recent Labs    02/26/18 0330 02/27/18 0246  WBC 10.2 8.5  RBC 2.85* 2.71*  HCT 27.5* 26.9*  PLT 235 243   Recent Labs    02/25/18 0232  NA 135  K 4.2  CL 100  CO2 23  BUN 14  CREATININE 0.95  GLUCOSE 155*  CALCIUM 9.0   No results for input(s): LABPT, INR in the last 72 hours. Left hip exam: Neurovascular intact Sensation intact distally Intact pulses distally Dorsiflexion/Plantar flexion intact Incision: dressing C/D/I Compartment soft  Assessment/Plan: 3 Days Post-Op Procedure(s) (LRB): ARTHROPLASTY (HEMIARTHROPLASTY) non cement (Left)  Acute blood loss anemia, expected.  Asymptomatic. Plan: Up with physical therapy weightbearing as tolerated on the left lower extremity with posterior hip precautions. Aspirin 325 mg 1 daily 1 month postop for DVT prophylaxis. Will need skilled nursing facility.  Should be ready to go at this point.Would minimize narcotic pain medicine. Follow-up with Dr. Luiz BlareGraves in 2 weeks.    Matthew FolksJames G Glenna Brunkow 02/27/2018, 10:05 AM

## 2018-02-27 NOTE — NC FL2 (Signed)
[place] MEDICAID FL2 LEVEL OF CARE SCREENING TOOL     IDENTIFICATION  Patient Name: Chloe Cooper Birthdate: [birth year] Sex: female Admission Date (Current Location): 02/23/2018  [facility] and [place] Number:  Producer, television/film/video and Address:  [facility] [place]. [facility], [address] [place], [place] [address]      Provider Number: [id number]  Attending Physician Name and Address:  Osvaldo Shipper, MD  Relative Name and Phone Number:  [name] [phone]    Current Level of Care: Hospital Recommended Level of Care: Skilled Nursing Facility Prior Approval Number:    Date Approved/Denied: 02/27/18 PASRR Number: [id number]  Discharge Plan: SNF    Current Diagnoses: Patient Active Problem List   Diagnosis Date Noted  . Fall at home, initial encounter 02/24/2018  . Hematoma of left parietal scalp 02/24/2018  . Prolonged QT interval 02/24/2018  . Pressure injury of skin 02/24/2018  . Femoral neck fracture (HCC) 02/23/2018  . Productive cough 02/13/2018  . Abdominal pain, left lower quadrant 02/13/2018  . Acute cystitis without hematuria 08/26/2017  . Intertrigo 04/19/2017  . B12 deficiency 12/24/2015  . Anemia 11/25/2015  . Burning sensation of mouth 04/22/2015  . Lumbar pain 10/30/2014  . Bilateral knee pain 10/30/2014  . Ingrown toenail 07/17/2014  . Encounter for Medicare annual wellness exam 02/23/2013  . Headache 06/09/2012  . Recurrent UTI 05/25/2011  . Back pain, thoracic 11/11/2010  . Thoracic back pain 11/07/2010  . Constipation 09/30/2010  . Osteoarthritis 08/21/2010  . Vitamin D deficiency 10/08/2008  . ESSENTIAL HYPERTENSION, BENIGN 10/02/2008  . HYPERCHOLESTEROLEMIA 07/19/2006  . Osteoporosis 07/19/2006    Orientation RESPIRATION BLADDER Height & Weight     Self, Situation  Normal Continent Weight: 56 kg Height:  5' 0.5" (153.7 cm)  BEHAVIORAL SYMPTOMS/MOOD NEUROLOGICAL BOWEL NUTRITION STATUS      Continent Diet(regular)   AMBULATORY STATUS COMMUNICATION OF NEEDS Skin   Limited Assist Verbally Other (Comment)(Stage 1 pressure ulcer- Coccxy, Closed incision- left hip)                       Personal Care Assistance Level of Assistance  Bathing, Feeding, Dressing Bathing Assistance: Limited assistance Feeding assistance: Independent Dressing Assistance: Limited assistance     Functional Limitations Info  Hearing, Speech, Sight Sight Info: Adequate Hearing Info: Impaired(hearing aids) Speech Info: Adequate    SPECIAL CARE FACTORS FREQUENCY  OT (By licensed OT), PT (By licensed PT)     PT Frequency: 5 OT Frequency: 5            Contractures Contractures Info: Not present    Additional Factors Info  Code Status, Allergies Code Status Info: DNR Allergies Info: ALENDRONATE SODIUM, PENICILLINS, SIMVASTATIN, SULFONAMIDE DERIVATIVES            Current Medications (02/27/2018):  This is the current hospital active medication list Current Facility-Administered Medications  Medication Dose Route Frequency Provider Last Rate Last Dose  . acetaminophen (TYLENOL) tablet 325-650 mg  325-650 mg Oral Q6H PRN Marshia Ly, PA-C   650 mg at 02/26/18 2231  . albuterol (PROVENTIL) (2.5 MG/3ML) 0.083% nebulizer solution 2.5 mg  2.5 mg Nebulization Q4H PRN Marshia Ly, PA-C      . alum & mag hydroxide-simeth (MAALOX/MYLANTA) 200-200-20 MG/5ML suspension 30 mL  30 mL Oral Q4H PRN Marshia Ly, PA-C      . aspirin EC tablet 325 mg  325 mg Oral Q breakfast Marshia Ly, PA-C   325 mg at 02/27/18 0947  .  docusate sodium (COLACE) capsule 100 mg  100 mg Oral BID Marshia Ly, PA-C   100 mg at 02/27/18 1610  . fentaNYL (SUBLIMAZE) injection 25-50 mcg  25-50 mcg Intravenous Q3H PRN Marshia Ly, PA-C   50 mcg at 02/26/18 2328  . fluticasone (FLONASE) 50 MCG/ACT nasal spray 1 spray  1 spray Each Nare Daily PRN Marshia Ly, PA-C      . HYDROcodone-acetaminophen (NORCO/VICODIN) 5-325 MG per tablet 1  tablet  1 tablet Oral Q6H PRN Marshia Ly, PA-C   1 tablet at 02/27/18 0526  . lip balm (CARMEX) ointment 1 application  1 application Topical PRN Marshia Ly, PA-C      . ondansetron [facility]) tablet 4 mg  4 mg Oral Q6H PRN Marshia Ly, PA-C       Or  . ondansetron [facility]) injection 4 mg  4 mg Intravenous Q6H PRN Marshia Ly, PA-C      . phenol (CHLORASEPTIC) mouth spray 1 spray  1 spray Mouth/Throat PRN Marshia Ly, PA-C      . polyethylene glycol (MIRALAX / GLYCOLAX) packet 17 g  17 g Oral Daily Osvaldo Shipper, MD   17 g at 02/27/18 0947  . polyvinyl alcohol (LIQUIFILM TEARS) 1.4 % ophthalmic solution 1 drop  1 drop Both Eyes PRN Marshia Ly, PA-C      . sodium chloride (OCEAN) 0.65 % nasal spray 1 spray  1 spray Each Nare PRN Marshia Ly, PA-C         Discharge Medications: Please see discharge summary for a list of discharge medications.  Relevant Imaging Results:  Relevant Lab Results:   Additional Information SS#: [id number]  Gildardo Griffes, LCSW

## 2018-02-27 NOTE — Discharge Summary (Signed)
[facility]  Physician Discharge Summary   Patient ID: Chloe Cooper MRN: [id number] DOB/AGE: 07-Jul-1916 84 y.o.  Admit date: 02/23/2018 Discharge date: 02/27/2018  PCP: Judy Pimple, MD  DISCHARGE DIAGNOSES:  Left hip fracture status post hemiarthroplasty Left scalp hematoma, stable Anemia due to acute blood loss from surgery Mild postoperative delirium, resolved  RECOMMENDATIONS FOR OUTPATIENT FOLLOW UP: 1. CBC and basic metabolic panel in 1 week 2. Outpatient follow-up with orthopedics.   DISCHARGE CONDITION: fair  Diet recommendation: Regular as tolerated  Filed Weights   02/25/18 0800  Weight: 56 kg    INITIAL HISTORY: 84 year old female with sparse medical history of reflux osteoporosis and osteoarthritis diverticulitis hyperlipoidemia who center after mechanical fall and was found to have minimally displaced left femoral neck fracture.  She was admitted initially to [facility] hospital and then transferred over to [facility] for surgical intervention.  Consultations:  Orthopedics  Procedures: Left hemiarthroplasty with a [place] basic stem, a minus neck ball and a 43 mm head ball.   HOSPITAL COURSE:   Left femoral neck fracture secondary to fall at home Status post surgery 12/20.  Patient remained stable.  Pain is reasonably well controlled.  Bowel regimen.  Aspirin for DVT prophylaxis per orthopedics.  Seen by PT and OT.  Plan is for her to go to skilled nursing facility for rehab.  Left scalp hematoma This is as a result of the fall. CT scan imaging of the brain showed no intracranial bleed.  Anemia likely due to acute blood loss Hemoglobin did drop most likely due to operative loss.  Stable.  Recheck counts at skilled nursing facility.  Mild postop delirium Resolved.  Overall stable.  Okay for discharge to skilled nursing facility.  She is DNR.   PERTINENT LABS:  The results of significant diagnostics from this  hospitalization (including imaging, microbiology, ancillary and laboratory) are listed below for reference.      Labs: Basic Metabolic Panel: Recent Labs  Lab 02/23/18 2047 02/25/18 0232  NA 135 135  K 3.9 4.2  CL 99 100  CO2 24 23  GLUCOSE 102* 155*  BUN 21 14  CREATININE 0.97 0.95  CALCIUM 9.2 9.0   CBC: Recent Labs  Lab 02/23/18 2047 02/25/18 0232 02/26/18 0330 02/27/18 0246  WBC 11.3* 9.4 10.2 8.5  NEUTROABS 9.0*  --   --   --   HGB 10.7* 10.0* 8.8* 8.5*  HCT 33.9* 31.7* 27.5* 26.9*  MCV 100.6* 98.8 96.5 99.3  PLT 306 262 235 243     IMAGING STUDIES Dg Chest 1 View  Result Date: 02/23/2018 CLINICAL DATA:  Left hip pain after a fall today. EXAM: CHEST  1 VIEW COMPARISON:  02/13/2018 FINDINGS: Shallow inspiration with elevation of left hemidiaphragm. Linear atelectasis in the lung bases. Mild cardiac enlargement. No vascular congestion, edema, or consolidation. No blunting of costophrenic angles. No pneumothorax. Mediastinal contours appear intact. Similar appearance of the chest to previous study. IMPRESSION: Shallow inspiration with linear atelectasis in the lung bases. Mild cardiac enlargement. No evidence of active pulmonary disease. Electronically Signed   By: Burman Nieves M.D.   On: 02/23/2018 21:50   Dg Chest 2 View  Result Date: 02/14/2018 CLINICAL DATA:  Productive cough, hypertension, nonsmoker. EXAM: CHEST - 2 VIEW COMPARISON:  Chest x-ray of September 25, 2010 FINDINGS: There is chronic mild elevation of the left hemidiaphragm. There are coarse lung markings at the left lung base. There is no definite pleural effusion. The heart and  pulmonary vascularity are normal. There is calcification in the wall of the aortic arch. The mediastinum is normal in width. The bony thorax exhibits no acute abnormality. IMPRESSION: Chronic bronchitic changes. Minimal basilar atelectasis on the left. No discrete pneumonia. No CHF. Thoracic aortic atherosclerosis. Electronically  Signed   By: David  [place] M.D.   On: 02/14/2018 07:24   Dg Thoracic Spine W/swimmers  Result Date: 02/14/2018 CLINICAL DATA:  Chronic midline thoracic back pain. EXAM: THORACIC SPINE - 3 VIEWS COMPARISON:  02/13/2018. FINDINGS: Diffuse osteopenia. Mild kyphoscoliosis. Diffuse multilevel degenerative change noted about the cervicothoracic and thoracolumbar spine. No acute bony abnormality. Aortic atherosclerotic vascular calcification. IMPRESSION: 1. Diffuse osteopenia. Mild kyphoscoliosis. Diffuse multilevel degenerative change noted about the cervicothoracic and thoracolumbar spine. 2.  Aortic atherosclerotic vascular disease. Electronically Signed   By: Maisie Fus  Register   On: 02/14/2018 07:26   Dg Knee 1-2 Views Left  Result Date: 02/23/2018 CLINICAL DATA:  84 y/o  F; fall with left hip and knee pain. EXAM: LEFT KNEE - 1-2 VIEW COMPARISON:  None. FINDINGS: Bones are demineralized. No acute fracture or dislocation identified. No joint effusion. Vascular calcifications noted. Calcification with femorotibial compartments is compatible with chondrocalcinosis. IMPRESSION: 1. No acute fracture or dislocation identified. 2. Femorotibial compartments chondrocalcinosis. Electronically Signed   By: Mitzi Hansen M.D.   On: 02/23/2018 20:50   Dg Abd 1 View  Result Date: 02/14/2018 CLINICAL DATA:  Diarrhea and left lower quadrant pain. Remote history of kidney stones. EXAM: ABDOMEN - 1 VIEW COMPARISON:  AP view of the abdomen from a lumbar spine series of October 30, 2014 FINDINGS: The colonic stool burden is moderate. There is no small or large bowel obstructive pattern. There is mild chronic elevation of the left hemidiaphragm. The stomach bubble lies beneath it. There surgical clips in the right upper quadrant. There are innumerable phleboliths within the pelvis. No definite urinary tract stones are observed. There is mild loss of height of the body of L1 slightly more conspicuous than on the  previous study. IMPRESSION: Moderately increased colonic stool burden may reflect constipation in the appropriate clinical setting. No evidence of bowel obstruction or ileus. No calcified kidney stones are observed. Electronically Signed   By: David  [place] M.D.   On: 02/14/2018 07:25   Ct Head Wo Contrast  Result Date: 02/23/2018 CLINICAL DATA:  Fall.  Head injury EXAM: CT HEAD WITHOUT CONTRAST TECHNIQUE: Contiguous axial images were obtained from the base of the skull through the vertex without intravenous contrast. COMPARISON:  None. FINDINGS: Brain: Generalized atrophy. Moderately large bifrontal subdural hygromas. No acute hemorrhage or mass. Mild chronic ischemic change. No acute infarct. No midline shift. Vascular: Arterial calcification.  Negative for hyperdense vessel Skull: Negative for skull fracture.  Left parietal scalp hematoma Sinuses/Orbits: Mild mucosal edema paranasal sinuses. Bilateral ocular surgery. Other: None IMPRESSION: No acute intracranial abnormality Left parietal scalp hematoma Electronically Signed   By: Marlan Palau M.D.   On: 02/23/2018 20:45   Pelvis Portable  Result Date: 02/24/2018 CLINICAL DATA:  Status post left hemiarthroplasty EXAM: PORTABLE PELVIS 1-2 VIEWS COMPARISON:  02/23/2018 FINDINGS: Interval left hip arthroplasty. Pubic symphysis and rami are intact. Gas in the soft tissues consistent with recent surgery. Vascular calcifications. IMPRESSION: Interval left hip arthroplasty with expected surgical changes Electronically Signed   By: Jasmine Pang M.D.   On: 02/24/2018 21:42   Dg Hip Unilat With Pelvis 2-3 Views Left  Result Date: 02/23/2018 CLINICAL DATA:  84 y/o  F; fall with  left hip and knee pain. EXAM: DG HIP (WITH OR WITHOUT PELVIS) 2-3V LEFT COMPARISON:  None. FINDINGS: Acute minimally displaced left femoral neck fracture. No dislocation of the femur joints. No acute pelvic fracture or diastasis. Bones are demineralized. IMPRESSION: Acute minimally  displaced left femoral neck fracture. No joint dislocation. Electronically Signed   By: Mitzi Hansen M.D.   On: 02/23/2018 20:48    DISCHARGE EXAMINATION: Vitals:   02/26/18 0525 02/26/18 1600 02/26/18 1936 02/27/18 0524  BP: 132/78 (!) 126/57 (!) 125/57 (!) 118/53  Pulse: 98 88 99 78  Resp:  18  14  Temp:  98.7 F (37.1 C) 98.1 F (36.7 C) 98.2 F (36.8 C)  TempSrc:  Oral Oral Oral  SpO2: 95% 98% 96% 93%  Weight:      Height:       General appearance: alert, cooperative, appears stated age and no distress Resp: clear to auscultation bilaterally Cardio: regular rate and rhythm, S1, S2 normal, no murmur, click, rub or gallop GI: soft, non-tender; bowel sounds normal; no masses,  no organomegaly  DISPOSITION: SNF  Discharge Instructions    Call MD for:  difficulty breathing, headache or visual disturbances   Complete by:  As directed    Call MD for:  extreme fatigue   Complete by:  As directed    Call MD for:  persistant dizziness or light-headedness   Complete by:  As directed    Call MD for:  persistant nausea and vomiting   Complete by:  As directed    Call MD for:  severe uncontrolled pain   Complete by:  As directed    Call MD for:  temperature >100.4   Complete by:  As directed    Discharge instructions   Complete by:  As directed    Please review instructions on the discharge summary  You were cared for by a hospitalist during your hospital stay. If you have any questions about your discharge medications or the care you received while you were in the hospital after you are discharged, you can call the unit and asked to speak with the hospitalist on call if the hospitalist that took care of you is not available. Once you are discharged, your primary care physician will handle any further medical issues. Please note that NO REFILLS for any discharge medications will be authorized once you are discharged, as it is imperative that you return to your primary care  physician (or establish a relationship with a primary care physician if you do not have one) for your aftercare needs so that they can reassess your need for medications and monitor your lab values. If you do not have a primary care physician, you can call [phone] for a physician referral.   Increase activity slowly   Complete by:  As directed    Weight bearing as tolerated   Complete by:  As directed    With posterior hip precautions   Laterality:  left   Extremity:  Lower        Allergies as of 02/27/2018      Reactions   Alendronate Sodium Swelling   REACTION: GI   Penicillins    REACTION: rash   Simvastatin    Leg pain and cramping    Sulfonamide Derivatives    REACTION: rash      Medication List    STOP taking these medications   cephALEXin 250 MG capsule Commonly known as:  KEFLEX     TAKE these medications  acetaminophen 500 MG tablet Commonly known as:  TYLENOL Take 500 mg by mouth every 8 (eight) hours as needed for mild pain.   aspirin EC 325 MG tablet Take 1 tablet (325 mg total) by mouth daily. Take x 1 month postop to decrease risk of blood clots.   [employer] GENTLE LUBRICANT EYE DROPS OP Apply 2 drops to eye 4 (four) times daily as needed (dry eyes).   docusate sodium 100 MG capsule Commonly known as:  COLACE Take 1 capsule (100 mg total) by mouth 2 (two) times daily.   fluticasone 50 MCG/ACT nasal spray Commonly known as:  FLONASE Place 1 spray into both nostrils daily. What changed:    when to take this  reasons to take this   HYDROcodone-acetaminophen 5-325 MG tablet Commonly known as:  NORCO Take 1-2 tablets by mouth every 8 (eight) hours as needed for moderate pain.   iron polysaccharides 150 MG capsule Commonly known as:  FERREX 150 Take 1 capsule (150 mg total) by mouth 2 (two) times daily.   polyethylene glycol packet Commonly known as:  MIRALAX / GLYCOLAX Take 17 g by mouth daily. Start taking on:  February 28, 2018   ranitidine  75 MG tablet Commonly known as:  ZANTAC Take 75 mg by mouth daily as needed for heartburn.   VITAMIN D3 PO Take 1 capsule by mouth daily.            Discharge Care Instructions  (From admission, onward)         Start     Ordered   02/24/18 0000  Weight bearing as tolerated    Comments:  With posterior hip precautions  Question Answer Comment  Laterality left   Extremity Lower      02/24/18 2020           Follow-up Information    Jodi Geralds, MD. Schedule an appointment as soon as possible for a visit in 2 weeks.   Specialty:  Orthopedic Surgery Contact information: [address] [place] [address] [phone]        Judy Pimple, MD. Schedule an appointment as soon as possible for a visit in 3 week(s).   Specialties:  Family Medicine, Radiology Contact information: [address] [place] [address] [phone]           TOTAL DISCHARGE TIME: 35 mins  Osvaldo Shipper  [facility] Pager [phone]  02/27/2018, 2:00 PM

## 2018-02-27 NOTE — Clinical Social Work Note (Signed)
Clinical Social Work Assessment  Patient Details  Name: Chloe Cooper MRN: [id number] Date of Birth: 04/03/1916  Date of referral:  02/27/18               Reason for consult:  Discharge Planning                Permission sought to share information with:  Case Manager, Facility Medical sales representativeContact Representative, Family Supports Permission granted to share information::  Yes, Verbal Permission Granted  Name::     Chloe Cooper  Agency::  SNFS  Relationship::  son and POA  Contact Information:  [phone]  Housing/Transportation Living arrangements for the past 2 months:  Single Family Home Source of Information:  Patient Patient Interpreter Needed:  None Criminal Activity/Legal Involvement Pertinent to Current Situation/Hospitalization:  No - Comment as needed Significant Relationships:  Adult Children Lives with:  Self Do you feel safe going back to the place where you live?  No Need for family participation in patient care:  Yes (Comment)  Care giving concerns:  CSW received referral for possible SNF placement at time of discharge. Spoke with patient regarding possibility of SNF placement . Patient's  Son and POA Chloe Cooper  is currently unable to care for her at their home given patient's current needs and fall risk.  Patient and son at bedside expressed understanding of PT recommendation and are agreeable to SNF placement at time of discharge. CSW to continue to follow and assist with discharge planning needs.     Social Worker assessment / plan:  Spoke with patient and son Chloe Cooper at bedside who is also POA  [facility].    Employment status:  Retired Database administratornsurance information:  Managed Medicare PT Recommendations:  Skilled Nursing Facility Information / Referral to community resources:  Skilled Nursing Facility  Patient/Family's Response to care:  Patient and  Son at bedside   recognize need for rehab before returning home and are agreeable to  a SNF in [place]. They report preference [facility]  [facility]  . CSW explained insurance authorization process. Patient's family reported that they want patient to get stronger to be able to come back home.    Patient/Family's Understanding of and Emotional Response to Diagnosis, Current Treatment, and Prognosis:  Patient/family is realistic regarding therapy needs and expressed being hopeful for SNF placement. Patient expressed understanding of CSW role and discharge process as well as medical condition. No questions/concerns about plan or treatment.    Emotional Assessment Appearance:  Appears stated age Attitude/Demeanor/Rapport:  Engaged, Gracious Affect (typically observed):  Accepting, Adaptable Orientation:  Oriented to Self, Oriented to Place, Oriented to  Time, Oriented to Situation Alcohol / Substance use:  Not Applicable Psych involvement (Current and /or in the community):  No (Comment)  Discharge Needs  Concerns to be addressed:  Discharge Planning Concerns Readmission within the last 30 days:  No Current discharge risk:  Dependent with Mobility Barriers to Discharge:  Continued Medical Work up   Dynegyshley M Alvey Brockel, LCSW 02/27/2018, 10:12 AM

## 2018-02-27 NOTE — Care Management Important Message (Signed)
Important Message  Patient Details  Name: Lowella BandyMolene C Spindler MRN: [id number] Date of Birth: 06/04/1916   Medicare Important Message Given:  Yes    Ajmal Kathan Stefan ChurchBratton 02/27/2018, 4:33 PM

## 2018-02-27 NOTE — Progress Notes (Signed)
Physical Therapy Treatment Patient Details Name: Chloe Cooper MRN: [id number] DOB: 1916-12-04 Today's Date: 02/27/2018    History of Present Illness Patient fell and suffered a left hip fx. She had a posterior hemiarthroplasty on 02/24/2018. She alos has a head contusion and left hip pain. She reports baseline right foot and right knee pain. She is currently in an imobilizer. WUJ:WJXBJ wrist fx; osteoperosis, GERD, DDD, cataracts, bakers cyst     PT Comments    Pt making progress with PT. Continue to recommend SNF for ongoing Physical Therapy.      Follow Up Recommendations  SNF;Supervision for mobility/OOB     Equipment Recommendations  None recommended by PT    Recommendations for Other Services       Precautions / Restrictions Precautions Precautions: Posterior Hip;Fall Precaution Booklet Issued: Yes (comment) Precaution Comments: discussed precautions with son [name] Required Braces or Orthoses: Knee Immobilizer - Left Restrictions Weight Bearing Restrictions: Yes LLE Weight Bearing: Weight bearing as tolerated    Mobility  Bed Mobility Overal bed mobility: Needs Assistance Bed Mobility: Supine to Sit     Supine to sit: Mod assist;+2 for physical assistance     General bed mobility comments: (Bed mobility was the mostz painful part of activity today)  Transfers Overall transfer level: Needs assistance Equipment used: Rolling walker (2 wheeled) Transfers: Sit to/from Stand Sit to Stand: Min assist;From elevated surface Stand pivot transfers: Mod assist       General transfer comment: Cues for safest and easiest technique  Ambulation/Gait Ambulation/Gait assistance: Min assist Gait Distance (Feet): 12 Feet Assistive device: Rolling walker (2 wheeled) Gait Pattern/deviations: Step-to pattern;Decreased stride length;Antalgic     General Gait Details: appeared to have less xpain with gait then yesterday   Stairs             Wheelchair  Mobility    Modified Rankin (Stroke Patients Only)       Balance                                            Cognition Arousal/Alertness: Awake/alert Behavior During Therapy: WFL for tasks assessed/performed Overall Cognitive Status: Within Functional Limits for tasks assessed                                        Exercises Total Joint Exercises Ankle Circles/Pumps: AROM;Both;10 reps;Supine Short Arc Quad: AROM;10 reps;Left;Supine Heel Slides: AAROM;Left;10 reps;Supine Hip ABduction/ADduction: AAROM;Left;10 reps;Supine    General Comments General comments (skin integrity, edema, etc.): Pt's son [name] present throughout session with  pt's permission      Pertinent Vitals/Pain Pain Assessment: Faces Faces Pain Scale: Hurts whole lot Pain Location: left hip Pain Intervention(s): Limited activity within patient's tolerance;Monitored during session;Other (comment)(pain was the worst with bed mobilty)    Home Living                      Prior Function            PT Goals (current goals can now be found in the care plan section) Progress towards PT goals: Progressing toward goals    Frequency    Min 5X/week      PT Plan Current plan remains appropriate    Co-evaluation  AM-PAC PT "6 Clicks" Mobility   Outcome Measure  Help needed turning from your back to your side while in a flat bed without using bedrails?: A Lot Help needed moving from lying on your back to sitting on the side of a flat bed without using bedrails?: A Lot Help needed moving to and from a bed to a chair (including a wheelchair)?: A Lot Help needed standing up from a chair using your arms (e.g., wheelchair or bedside chair)?: A Little Help needed to walk in hospital room?: A Little Help needed climbing 3-5 steps with a railing? : Total 6 Click Score: 13    End of Session Equipment Utilized During Treatment: Gait belt;Left knee  immobilizer Activity Tolerance: Patient tolerated treatment well Patient left: in chair;with call bell/phone within reach;with family/visitor present;with chair alarm set Nurse Communication: Mobility status PT Visit Diagnosis: Unsteadiness on feet (R26.81)     Time: 9147-8295 PT Time Calculation (min) (ACUTE ONLY): 26 min  Charges:  $Gait Training: 8-22 mins $Therapeutic Exercise: 8-22 mins                     Lavona Mound, PT   Acute Rehabilitation Services  Pager [phone] Office [phone] 02/27/2018    Donnella Sham 02/27/2018, 11:25 AM

## 2018-02-28 NOTE — Progress Notes (Signed)
CSW called [employer] insurance which reports patient's Chloe Harveyauth is still pending. If not received today it will be tomorrow.   CSW will continue to follow up.   LenapahAshley Declan [place], [place] [phone]

## 2018-02-28 NOTE — Progress Notes (Signed)
Pt family packed belongings, prescription In drawer, IV removed and pain medication given.

## 2018-02-28 NOTE — Clinical Social Work Placement (Signed)
CLINICAL SOCIAL WORK PLACEMENT  NOTE  Date:  02/28/2018  Patient Details  Name: Chloe Cooper MRN: [id number] Date of Birth: 1916-05-08  Clinical Social Work is seeking post-discharge placement for this patient at the Skilled  Nursing Facility level of care (*CSW will initial, date and re-position this form in  chart as items are completed):  Yes   Patient/family provided with [facility] Clinical Social Work Department's list of facilities offering this level of care within the geographic area requested by the patient (or if unable, by the patient's family).  Yes   Patient/family informed of their freedom to choose among providers that offer the needed level of care, that participate in Medicare, Medicaid or managed care program needed by the patient, have an available bed and are willing to accept the patient.      Patient/family informed of 's ownership interest in [facility] and [facility], as well as of the fact that they are under no obligation to receive care at these facilities.  PASRR submitted to EDS on       PASRR number received on 02/27/18     Existing PASRR number confirmed on       FL2 transmitted to all facilities in geographic area requested by pt/family on 02/27/18     FL2 transmitted to all facilities within larger geographic area on       Patient informed that his/her managed care company has contracts with or will negotiate with certain facilities, including the following:        Yes   Patient/family informed of bed offers received.  Patient chooses bed at [facility]     Physician recommends and patient chooses bed at      Patient to be transferred to [facility] on 02/28/18.  Patient to be transferred to facility by PTAR     Patient family notified on 02/28/18 of transfer.  Name of family member notified:  [name]     PHYSICIAN Please sign DNR     Additional Comment:     _______________________________________________ Gildardo Griffes, LCSW 02/28/2018, 11:13 AM

## 2018-02-28 NOTE — Progress Notes (Signed)
RN called and gave report [facility]. Pt notified, PTAR arranged.

## 2018-02-28 NOTE — Progress Notes (Signed)
Patient will DC ZO:XWRUEAto:[place] Anticipated DC date: 02/28/18 Family notified: Casimiro NeedleMichael Transport by: Sharin MonsPTAR  Per MD patient ready for [facility] . RN, patient, patient's family, and facility notified of DC. Discharge Summary sent to facility. RN given number for report [phone]. DC packet on chart. Ambulance transport requested for patient.  CSW signing off.  [place]ley Talayla Doyel, [place] [phone]

## 2018-02-28 NOTE — Progress Notes (Signed)
Physical Therapy Treatment Patient Details Name: Chloe Cooper MRN: [id number] DOB: 11-Jan-1917 Today's Date: 02/28/2018    History of Present Illness Patient fell and suffered a left hip fx. She had a posterior hemiarthroplasty on 02/24/2018. She alos has a head contusion and left hip pain. She reports baseline right foot and right knee pain. She is currently in an imobilizer. WUJ:WJXBJ wrist fx; osteoperosis, GERD, DDD, cataracts, bakers cyst     PT Comments    Pt continues to make progress. Continue to recommend SNF for ongoing Physical Therapy.      Follow Up Recommendations  SNF;Supervision for mobility/OOB     Equipment Recommendations  None recommended by PT    Recommendations for Other Services       Precautions / Restrictions Precautions Precautions: Posterior Hip;Fall Precaution Booklet Issued: Yes (comment) Required Braces or Orthoses: Knee Immobilizer - Left Restrictions Weight Bearing Restrictions: Yes LLE Weight Bearing: Weight bearing as tolerated    Mobility  Bed Mobility Overal bed mobility: Needs Assistance Bed Mobility: Supine to Sit     Supine to sit: Mod assist        Transfers Overall transfer level: Needs assistance Equipment used: Rolling walker (2 wheeled) Transfers: Sit to/from Stand Sit to Stand: Min assist         General transfer comment: Cues for safest and easiest technique(Pt used BSC over toilet during session)  Ambulation/Gait Ambulation/Gait assistance: Min assist Distance:  15 feet and 8 feet to bathroom Assistive device: Rolling walker (2 wheeled) Gait Pattern/deviations: Step-to pattern;Decreased stride length;Antalgic       Stairs             Wheelchair Mobility    Modified Rankin (Stroke Patients Only)       Balance                                            Cognition Arousal/Alertness: Awake/alert Behavior During Therapy: WFL for tasks assessed/performed Overall Cognitive  Status: Within Functional Limits for tasks assessed                                        Exercises Total Joint Exercises Ankle Circles/Pumps: AROM;Both;10 reps;Supine Short Arc Quad: AROM;10 reps;Left;Supine Heel Slides: AAROM;Left;10 reps;Supine Hip ABduction/ADduction: AAROM;Left;10 reps;Supine    General Comments General comments (skin integrity, edema, etc.): Pt's son [name] present throughout session today.       Pertinent Vitals/Pain Pain Assessment: 0-10 Faces Pain Scale: Hurts whole lot Pain Location: left hip Pain Intervention(s): Limited activity within patient's tolerance;Monitored during session;Premedicated before session    Home Living                      Prior Function            PT Goals (current goals can now be found in the care plan section) Progress towards PT goals: Progressing toward goals    Frequency    Min 5X/week      PT Plan Current plan remains appropriate    Co-evaluation              AM-PAC PT "6 Clicks" Mobility   Outcome Measure  Help needed turning from your back to your side while in a flat bed without using bedrails?: A Lot  Help needed moving from lying on your back to sitting on the side of a flat bed without using bedrails?: A Lot Help needed moving to and from a bed to a chair (including a wheelchair)?: A Little Help needed standing up from a chair using your arms (e.g., wheelchair or bedside chair)?: A Little Help needed to walk in hospital room?: A Little Help needed climbing 3-5 steps with a railing? : Total 6 Click Score: 14    End of Session Equipment Utilized During Treatment: Gait belt;Left knee immobilizer Activity Tolerance: Patient tolerated treatment well Patient left: in chair;with call bell/phone within reach;with family/visitor present;with chair alarm set   PT Visit Diagnosis: Unsteadiness on feet (R26.81)     Time: 5366-4403 PT Time Calculation (min) (ACUTE ONLY): 27  min  Charges:  $Gait Training: 8-22 mins $Therapeutic Exercise: 8-22 mins                     Lavona Mound, PT   Acute Rehabilitation Services  Pager [phone] Office [phone] 02/28/2018    Donnella Sham 02/28/2018, 10:29 AM

## 2018-02-28 NOTE — Discharge Summary (Signed)
[facility]  Physician Discharge Summary   Patient ID: Chloe Cooper MRN: [id number] DOB/AGE: 83/08/1916 82 y.o.  Admit date: 02/23/2018 Discharge date: 02/28/2018  PCP: Judy Pimpleower, Marne A, MD  DISCHARGE DIAGNOSES:  Left hip fracture status post hemiarthroplasty Left scalp hematoma, stable Anemia due to acute blood loss from surgery Mild postoperative delirium, resolved  RECOMMENDATIONS FOR OUTPATIENT FOLLOW UP: 1. CBC and basic metabolic panel in 1 week 2. Outpatient follow-up with orthopedics.   DISCHARGE CONDITION: fair  Diet recommendation: Regular as tolerated  Filed Weights   02/25/18 0800  Weight: 56 kg    INITIAL HISTORY: 84 year old female with sparse medical history of reflux osteoporosis and osteoarthritis diverticulitis hyperlipoidemia who center after mechanical fall and was found to have minimally displaced left femoral neck fracture.  [facility] and then [facility] intervention.  Consultations:  Orthopedics  Procedures: Left hemiarthroplasty with a [place] [place] basic stem, a minus neck ball and a 43 mm head ball.   HOSPITAL COURSE:   Left femoral neck fracture secondary to fall at home Status post surgery 12/20.  Patient remained stable.  Pain is reasonably well controlled.  Bowel regimen.  Aspirin for DVT prophylaxis per orthopedics.  Seen by PT and OT.  Plan is for her to go to skilled nursing facility for rehab.  Left scalp hematoma This is as a result of the fall. CT scan imaging of the brain showed no intracranial bleed.  Anemia likely due to acute blood loss Hemoglobin did drop most likely due to operative loss.  Stable.  Recheck counts at skilled nursing facility.  Mild postop delirium Resolved.  Patient feels well this morning.  Continues to have some discomfort in the left leg.  No shortness of breath or chest pain.  Overall stable.  Okay for discharge  to skilled nursing facility.  She is DNR.   PERTINENT LABS:  The results of significant diagnostics from this hospitalization (including imaging, microbiology, ancillary and laboratory) are listed below for reference.      Labs: Basic Metabolic Panel: Recent Labs  Lab 02/23/18 2047 02/25/18 0232  NA 135 135  K 3.9 4.2  CL 99 100  CO2 24 23  GLUCOSE 102* 155*  BUN 21 14  CREATININE 0.97 0.95  CALCIUM 9.2 9.0   CBC: Recent Labs  Lab 02/23/18 2047 02/25/18 0232 02/26/18 0330 02/27/18 0246  WBC 11.3* 9.4 10.2 8.5  NEUTROABS 9.0*  --   --   --   HGB 10.7* 10.0* 8.8* 8.5*  HCT 33.9* 31.7* 27.5* 26.9*  MCV 100.6* 98.8 96.5 99.3  PLT 306 262 235 243     IMAGING STUDIES Dg Chest 1 View  Result Date: 02/23/2018 CLINICAL DATA:  Left hip pain after a fall today. EXAM: CHEST  1 VIEW COMPARISON:  02/13/2018 FINDINGS: Shallow inspiration with elevation of left hemidiaphragm. Linear atelectasis in the lung bases. Mild cardiac enlargement. No vascular congestion, edema, or consolidation. No blunting of costophrenic angles. No pneumothorax. Mediastinal contours appear intact. Similar appearance of the chest to previous study. IMPRESSION: Shallow inspiration with linear atelectasis in the lung bases. Mild cardiac enlargement. No evidence of active pulmonary disease. Electronically Signed   By: Burman NievesWilliam  Stevens M.D.   On: 02/23/2018 21:50   Dg Chest 2 View  Result Date: 02/14/2018 CLINICAL DATA:  Productive cough, hypertension, nonsmoker. EXAM: CHEST - 2 VIEW COMPARISON:  Chest x-ray of September 25, 2010 FINDINGS: There is chronic mild  elevation of the left hemidiaphragm. There are coarse lung markings at the left lung base. There is no definite pleural effusion. The heart and pulmonary vascularity are normal. There is calcification in the wall of the aortic arch. The mediastinum is normal in width. The bony thorax exhibits no acute abnormality. IMPRESSION: Chronic bronchitic changes.  Minimal basilar atelectasis on the left. No discrete pneumonia. No CHF. Thoracic aortic atherosclerosis. Electronically Signed   By: David  SwazilandJordan M.D.   On: 02/14/2018 07:24   Dg Thoracic Spine W/swimmers  Result Date: 02/14/2018 CLINICAL DATA:  Chronic midline thoracic back pain. EXAM: THORACIC SPINE - 3 VIEWS COMPARISON:  02/13/2018. FINDINGS: Diffuse osteopenia. Mild kyphoscoliosis. Diffuse multilevel degenerative change noted about the cervicothoracic and thoracolumbar spine. No acute bony abnormality. Aortic atherosclerotic vascular calcification. IMPRESSION: 1. Diffuse osteopenia. Mild kyphoscoliosis. Diffuse multilevel degenerative change noted about the cervicothoracic and thoracolumbar spine. 2.  Aortic atherosclerotic vascular disease. Electronically Signed   By: Maisie Fushomas  Register   On: 02/14/2018 07:26   Dg Knee 1-2 Views Left  Result Date: 02/23/2018 CLINICAL DATA:  84 y/o  F; fall with left hip and knee pain. EXAM: LEFT KNEE - 1-2 VIEW COMPARISON:  None. FINDINGS: Bones are demineralized. No acute fracture or dislocation identified. No joint effusion. Vascular calcifications noted. Calcification with femorotibial compartments is compatible with chondrocalcinosis. IMPRESSION: 1. No acute fracture or dislocation identified. 2. Femorotibial compartments chondrocalcinosis. Electronically Signed   By: Mitzi HansenLance  Furusawa-Stratton M.D.   On: 02/23/2018 20:50   Dg Abd 1 View  Result Date: 02/14/2018 CLINICAL DATA:  Diarrhea and left lower quadrant pain. Remote history of kidney stones. EXAM: ABDOMEN - 1 VIEW COMPARISON:  AP view of the abdomen from a lumbar spine series of October 30, 2014 FINDINGS: The colonic stool burden is moderate. There is no small or large bowel obstructive pattern. There is mild chronic elevation of the left hemidiaphragm. The stomach bubble lies beneath it. There surgical clips in the right upper quadrant. There are innumerable phleboliths within the pelvis. No definite  urinary tract stones are observed. There is mild loss of height of the body of L1 slightly more conspicuous than on the previous study. IMPRESSION: Moderately increased colonic stool burden may reflect constipation in the appropriate clinical setting. No evidence of bowel obstruction or ileus. No calcified kidney stones are observed. Electronically Signed   By: David  [place] M.D.   On: 02/14/2018 07:25   Ct Head Wo Contrast  Result Date: 02/23/2018 CLINICAL DATA:  Fall.  Head injury EXAM: CT HEAD WITHOUT CONTRAST TECHNIQUE: Contiguous axial images were obtained from the base of the skull through the vertex without intravenous contrast. COMPARISON:  None. FINDINGS: Brain: Generalized atrophy. Moderately large bifrontal subdural hygromas. No acute hemorrhage or mass. Mild chronic ischemic change. No acute infarct. No midline shift. Vascular: Arterial calcification.  Negative for hyperdense vessel Skull: Negative for skull fracture.  Left parietal scalp hematoma Sinuses/Orbits: Mild mucosal edema paranasal sinuses. Bilateral ocular surgery. Other: None IMPRESSION: No acute intracranial abnormality Left parietal scalp hematoma Electronically Signed   By: Marlan Palauharles  Clark M.D.   On: 02/23/2018 20:45   Pelvis Portable  Result Date: 02/24/2018 CLINICAL DATA:  Status post left hemiarthroplasty EXAM: PORTABLE PELVIS 1-2 VIEWS COMPARISON:  02/23/2018 FINDINGS: Interval left hip arthroplasty. Pubic symphysis and rami are intact. Gas in the soft tissues consistent with recent surgery. Vascular calcifications. IMPRESSION: Interval left hip arthroplasty with expected surgical changes Electronically Signed   By: Jasmine PangKim  Fujinaga M.D.   On: 02/24/2018  21:42   Dg Hip Unilat With Pelvis 2-3 Views Left  Result Date: 02/23/2018 CLINICAL DATA:  84 y/o  F; fall with left hip and knee pain. EXAM: DG HIP (WITH OR WITHOUT PELVIS) 2-3V LEFT COMPARISON:  None. FINDINGS: Acute minimally displaced left femoral neck fracture. No  dislocation of the femur joints. No acute pelvic fracture or diastasis. Bones are demineralized. IMPRESSION: Acute minimally displaced left femoral neck fracture. No joint dislocation. Electronically Signed   By: Mitzi Hansen M.D.   On: 02/23/2018 20:48    DISCHARGE EXAMINATION: Vitals:   02/27/18 1603 02/27/18 2102 02/28/18 0431 02/28/18 0431  BP: (!) 104/48 (!) 115/52 123/64 123/64  Pulse: 86 82 88 88  Resp:  18 14 14   Temp: 98.1 F (36.7 C) 98.6 F (37 C) 98.5 F (36.9 C) 98.5 F (36.9 C)  TempSrc: Oral Oral Oral Oral  SpO2: 98% 95% 94% 94%  Weight:      Height:       General appearance: Alert.  In no distress Resp: Clear to auscultation bilaterally Cardio: S1-S2 is normal regular.  No S3-S4.  No rubs murmurs or bruit GI: Abdomen soft.  Nontender nondistended  DISPOSITION: SNF  Discharge Instructions    Call MD for:  difficulty breathing, headache or visual disturbances   Complete by:  As directed    Call MD for:  extreme fatigue   Complete by:  As directed    Call MD for:  persistant dizziness or light-headedness   Complete by:  As directed    Call MD for:  persistant nausea and vomiting   Complete by:  As directed    Call MD for:  severe uncontrolled pain   Complete by:  As directed    Call MD for:  temperature >100.4   Complete by:  As directed    Discharge instructions   Complete by:  As directed    Please review instructions on the discharge summary  You were cared for by a hospitalist during your hospital stay. If you have any questions about your discharge medications or the care you received while you were in the hospital after you are discharged, you can call the unit and asked to speak with the hospitalist on call if the hospitalist that took care of you is not available. Once you are discharged, your primary care physician will handle any further medical issues. Please note that NO REFILLS for any discharge medications will be authorized once you  are discharged, as it is imperative that you return to your primary care physician (or establish a relationship with a primary care physician if you do not have one) for your aftercare needs so that they can reassess your need for medications and monitor your lab values. If you do not have a primary care physician, you can call [phone] for a physician referral.   Increase activity slowly   Complete by:  As directed    Weight bearing as tolerated   Complete by:  As directed    With posterior hip precautions   Laterality:  left   Extremity:  Lower        Allergies as of 02/28/2018      Reactions   Alendronate Sodium Swelling   REACTION: GI   Penicillins    REACTION: rash   Simvastatin    Leg pain and cramping    Sulfonamide Derivatives    REACTION: rash      Medication List    STOP taking these medications  cephALEXin 250 MG capsule Commonly known as:  KEFLEX     TAKE these medications   acetaminophen 500 MG tablet Commonly known as:  TYLENOL Take 500 mg by mouth every 8 (eight) hours as needed for mild pain.   aspirin EC 325 MG tablet Take 1 tablet (325 mg total) by mouth daily. Take x 1 month postop to decrease risk of blood clots.   [employer] GENTLE LUBRICANT EYE DROPS OP Apply 2 drops to eye 4 (four) times daily as needed (dry eyes).   docusate sodium 100 MG capsule Commonly known as:  COLACE Take 1 capsule (100 mg total) by mouth 2 (two) times daily.   fluticasone 50 MCG/ACT nasal spray Commonly known as:  FLONASE Place 1 spray into both nostrils daily. What changed:    when to take this  reasons to take this   HYDROcodone-acetaminophen 5-325 MG tablet Commonly known as:  NORCO Take 1-2 tablets by mouth every 8 (eight) hours as needed for moderate pain.   iron polysaccharides 150 MG capsule Commonly known as:  FERREX 150 Take 1 capsule (150 mg total) by mouth 2 (two) times daily.   polyethylene glycol packet Commonly known as:  MIRALAX / GLYCOLAX Take  17 g by mouth daily.   ranitidine 75 MG tablet Commonly known as:  ZANTAC Take 75 mg by mouth daily as needed for heartburn.   VITAMIN D3 PO Take 1 capsule by mouth daily.            Discharge Care Instructions  (From admission, onward)         Start     Ordered   02/24/18 0000  Weight bearing as tolerated    Comments:  With posterior hip precautions  Question Answer Comment  Laterality left   Extremity Lower      02/24/18 2020            Contact information for follow-up providers    Jodi Geralds, MD. Schedule an appointment as soon as possible for a visit in 2 weeks.   Specialty:  Orthopedic Surgery Contact information: [address] [place] [address] [phone]        Judy Pimple, MD. Schedule an appointment as soon as possible for a visit in 3 week(s).   Specialties:  Family Medicine, Radiology Contact information: [address] [place] [address] [phone]            Contact information for after-discharge care    Destination    [facility] .   Service:  Skilled Nursing Contact information: [address] [place] [address] [phone]                  TOTAL DISCHARGE TIME: 35 mins  Osvaldo Shipper  [facility] Pager [phone]  02/28/2018, 11:11 AM

## 2018-03-08 DIAGNOSIS — R2689 Other abnormalities of gait and mobility: Secondary | ICD-10-CM | POA: Diagnosis not present

## 2018-03-08 DIAGNOSIS — R2681 Unsteadiness on feet: Secondary | ICD-10-CM | POA: Diagnosis not present

## 2018-03-08 DIAGNOSIS — Z471 Aftercare following joint replacement surgery: Secondary | ICD-10-CM | POA: Diagnosis not present

## 2018-03-08 DIAGNOSIS — Z9181 History of falling: Secondary | ICD-10-CM | POA: Diagnosis not present

## 2018-03-08 DIAGNOSIS — M6281 Muscle weakness (generalized): Secondary | ICD-10-CM | POA: Diagnosis not present

## 2018-03-08 DIAGNOSIS — S72092A Other fracture of head and neck of left femur, initial encounter for closed fracture: Secondary | ICD-10-CM | POA: Diagnosis not present

## 2018-03-08 DIAGNOSIS — R278 Other lack of coordination: Secondary | ICD-10-CM | POA: Diagnosis not present

## 2018-03-14 DIAGNOSIS — S72092A Other fracture of head and neck of left femur, initial encounter for closed fracture: Secondary | ICD-10-CM | POA: Diagnosis not present

## 2018-04-03 DIAGNOSIS — Z471 Aftercare following joint replacement surgery: Secondary | ICD-10-CM | POA: Diagnosis not present

## 2018-04-03 DIAGNOSIS — Z9181 History of falling: Secondary | ICD-10-CM | POA: Diagnosis not present

## 2018-04-03 DIAGNOSIS — M80052D Age-related osteoporosis with current pathological fracture, left femur, subsequent encounter for fracture with routine healing: Secondary | ICD-10-CM | POA: Diagnosis not present

## 2018-04-03 DIAGNOSIS — S7292XA Unspecified fracture of left femur, initial encounter for closed fracture: Secondary | ICD-10-CM | POA: Diagnosis not present

## 2018-04-03 DIAGNOSIS — I10 Essential (primary) hypertension: Secondary | ICD-10-CM | POA: Diagnosis not present

## 2018-04-03 DIAGNOSIS — Z792 Long term (current) use of antibiotics: Secondary | ICD-10-CM | POA: Diagnosis not present

## 2018-04-03 DIAGNOSIS — W19XXXD Unspecified fall, subsequent encounter: Secondary | ICD-10-CM | POA: Diagnosis not present

## 2018-04-03 DIAGNOSIS — M1990 Unspecified osteoarthritis, unspecified site: Secondary | ICD-10-CM | POA: Diagnosis not present

## 2018-04-03 DIAGNOSIS — D62 Acute posthemorrhagic anemia: Secondary | ICD-10-CM | POA: Diagnosis not present

## 2018-04-03 DIAGNOSIS — Z7982 Long term (current) use of aspirin: Secondary | ICD-10-CM | POA: Diagnosis not present

## 2018-04-03 DIAGNOSIS — Z96642 Presence of left artificial hip joint: Secondary | ICD-10-CM | POA: Diagnosis not present

## 2018-04-05 ENCOUNTER — Ambulatory Visit: Payer: Medicare PPO | Admitting: Family Medicine

## 2018-04-05 DIAGNOSIS — Z792 Long term (current) use of antibiotics: Secondary | ICD-10-CM | POA: Diagnosis not present

## 2018-04-05 DIAGNOSIS — I10 Essential (primary) hypertension: Secondary | ICD-10-CM | POA: Diagnosis not present

## 2018-04-05 DIAGNOSIS — Z9181 History of falling: Secondary | ICD-10-CM | POA: Diagnosis not present

## 2018-04-05 DIAGNOSIS — Z96642 Presence of left artificial hip joint: Secondary | ICD-10-CM | POA: Diagnosis not present

## 2018-04-05 DIAGNOSIS — M1990 Unspecified osteoarthritis, unspecified site: Secondary | ICD-10-CM | POA: Diagnosis not present

## 2018-04-05 DIAGNOSIS — M80052D Age-related osteoporosis with current pathological fracture, left femur, subsequent encounter for fracture with routine healing: Secondary | ICD-10-CM | POA: Diagnosis not present

## 2018-04-05 DIAGNOSIS — D62 Acute posthemorrhagic anemia: Secondary | ICD-10-CM | POA: Diagnosis not present

## 2018-04-05 DIAGNOSIS — W19XXXD Unspecified fall, subsequent encounter: Secondary | ICD-10-CM | POA: Diagnosis not present

## 2018-04-05 DIAGNOSIS — Z7982 Long term (current) use of aspirin: Secondary | ICD-10-CM | POA: Diagnosis not present

## 2018-04-06 DIAGNOSIS — W19XXXD Unspecified fall, subsequent encounter: Secondary | ICD-10-CM | POA: Diagnosis not present

## 2018-04-06 DIAGNOSIS — M1990 Unspecified osteoarthritis, unspecified site: Secondary | ICD-10-CM | POA: Diagnosis not present

## 2018-04-06 DIAGNOSIS — Z9181 History of falling: Secondary | ICD-10-CM | POA: Diagnosis not present

## 2018-04-06 DIAGNOSIS — Z7982 Long term (current) use of aspirin: Secondary | ICD-10-CM | POA: Diagnosis not present

## 2018-04-06 DIAGNOSIS — M80052D Age-related osteoporosis with current pathological fracture, left femur, subsequent encounter for fracture with routine healing: Secondary | ICD-10-CM | POA: Diagnosis not present

## 2018-04-06 DIAGNOSIS — Z96642 Presence of left artificial hip joint: Secondary | ICD-10-CM | POA: Diagnosis not present

## 2018-04-06 DIAGNOSIS — Z792 Long term (current) use of antibiotics: Secondary | ICD-10-CM | POA: Diagnosis not present

## 2018-04-06 DIAGNOSIS — D62 Acute posthemorrhagic anemia: Secondary | ICD-10-CM | POA: Diagnosis not present

## 2018-04-06 DIAGNOSIS — I10 Essential (primary) hypertension: Secondary | ICD-10-CM | POA: Diagnosis not present

## 2018-04-10 DIAGNOSIS — Z7982 Long term (current) use of aspirin: Secondary | ICD-10-CM | POA: Diagnosis not present

## 2018-04-10 DIAGNOSIS — M1990 Unspecified osteoarthritis, unspecified site: Secondary | ICD-10-CM | POA: Diagnosis not present

## 2018-04-10 DIAGNOSIS — D62 Acute posthemorrhagic anemia: Secondary | ICD-10-CM | POA: Diagnosis not present

## 2018-04-10 DIAGNOSIS — Z96642 Presence of left artificial hip joint: Secondary | ICD-10-CM | POA: Diagnosis not present

## 2018-04-10 DIAGNOSIS — Z792 Long term (current) use of antibiotics: Secondary | ICD-10-CM | POA: Diagnosis not present

## 2018-04-10 DIAGNOSIS — Z9181 History of falling: Secondary | ICD-10-CM | POA: Diagnosis not present

## 2018-04-10 DIAGNOSIS — I10 Essential (primary) hypertension: Secondary | ICD-10-CM | POA: Diagnosis not present

## 2018-04-10 DIAGNOSIS — M80052D Age-related osteoporosis with current pathological fracture, left femur, subsequent encounter for fracture with routine healing: Secondary | ICD-10-CM | POA: Diagnosis not present

## 2018-04-10 DIAGNOSIS — W19XXXD Unspecified fall, subsequent encounter: Secondary | ICD-10-CM | POA: Diagnosis not present

## 2018-04-11 DIAGNOSIS — M545 Low back pain: Secondary | ICD-10-CM | POA: Diagnosis not present

## 2018-04-11 DIAGNOSIS — D62 Acute posthemorrhagic anemia: Secondary | ICD-10-CM | POA: Diagnosis not present

## 2018-04-11 DIAGNOSIS — M4807 Spinal stenosis, lumbosacral region: Secondary | ICD-10-CM | POA: Diagnosis not present

## 2018-04-11 DIAGNOSIS — Z9181 History of falling: Secondary | ICD-10-CM | POA: Diagnosis not present

## 2018-04-11 DIAGNOSIS — Z792 Long term (current) use of antibiotics: Secondary | ICD-10-CM | POA: Diagnosis not present

## 2018-04-11 DIAGNOSIS — W19XXXD Unspecified fall, subsequent encounter: Secondary | ICD-10-CM | POA: Diagnosis not present

## 2018-04-11 DIAGNOSIS — M4317 Spondylolisthesis, lumbosacral region: Secondary | ICD-10-CM | POA: Diagnosis not present

## 2018-04-11 DIAGNOSIS — M1990 Unspecified osteoarthritis, unspecified site: Secondary | ICD-10-CM | POA: Diagnosis not present

## 2018-04-11 DIAGNOSIS — M80052D Age-related osteoporosis with current pathological fracture, left femur, subsequent encounter for fracture with routine healing: Secondary | ICD-10-CM | POA: Diagnosis not present

## 2018-04-11 DIAGNOSIS — Z9889 Other specified postprocedural states: Secondary | ICD-10-CM | POA: Diagnosis not present

## 2018-04-11 DIAGNOSIS — Z96642 Presence of left artificial hip joint: Secondary | ICD-10-CM | POA: Diagnosis not present

## 2018-04-11 DIAGNOSIS — I10 Essential (primary) hypertension: Secondary | ICD-10-CM | POA: Diagnosis not present

## 2018-04-11 DIAGNOSIS — Z7982 Long term (current) use of aspirin: Secondary | ICD-10-CM | POA: Diagnosis not present

## 2018-04-12 ENCOUNTER — Encounter: Payer: Self-pay | Admitting: Family Medicine

## 2018-04-12 ENCOUNTER — Telehealth: Payer: Self-pay | Admitting: Family Medicine

## 2018-04-12 ENCOUNTER — Ambulatory Visit (INDEPENDENT_AMBULATORY_CARE_PROVIDER_SITE_OTHER): Payer: Medicare Other | Admitting: Family Medicine

## 2018-04-12 VITALS — BP 118/64 | HR 73 | Temp 97.9°F | Ht 60.5 in

## 2018-04-12 DIAGNOSIS — Z9181 History of falling: Secondary | ICD-10-CM | POA: Diagnosis not present

## 2018-04-12 DIAGNOSIS — M810 Age-related osteoporosis without current pathological fracture: Secondary | ICD-10-CM | POA: Diagnosis not present

## 2018-04-12 DIAGNOSIS — I10 Essential (primary) hypertension: Secondary | ICD-10-CM | POA: Diagnosis not present

## 2018-04-12 DIAGNOSIS — K5903 Drug induced constipation: Secondary | ICD-10-CM

## 2018-04-12 DIAGNOSIS — S72002A Fracture of unspecified part of neck of left femur, initial encounter for closed fracture: Secondary | ICD-10-CM | POA: Diagnosis not present

## 2018-04-12 DIAGNOSIS — R51 Headache: Secondary | ICD-10-CM

## 2018-04-12 DIAGNOSIS — R519 Headache, unspecified: Secondary | ICD-10-CM

## 2018-04-12 DIAGNOSIS — W19XXXD Unspecified fall, subsequent encounter: Secondary | ICD-10-CM | POA: Diagnosis not present

## 2018-04-12 DIAGNOSIS — M546 Pain in thoracic spine: Secondary | ICD-10-CM

## 2018-04-12 DIAGNOSIS — D509 Iron deficiency anemia, unspecified: Secondary | ICD-10-CM

## 2018-04-12 DIAGNOSIS — M1990 Unspecified osteoarthritis, unspecified site: Secondary | ICD-10-CM | POA: Diagnosis not present

## 2018-04-12 DIAGNOSIS — Z96642 Presence of left artificial hip joint: Secondary | ICD-10-CM | POA: Diagnosis not present

## 2018-04-12 DIAGNOSIS — G8929 Other chronic pain: Secondary | ICD-10-CM

## 2018-04-12 DIAGNOSIS — E538 Deficiency of other specified B group vitamins: Secondary | ICD-10-CM | POA: Diagnosis not present

## 2018-04-12 DIAGNOSIS — Z7982 Long term (current) use of aspirin: Secondary | ICD-10-CM | POA: Diagnosis not present

## 2018-04-12 DIAGNOSIS — Z792 Long term (current) use of antibiotics: Secondary | ICD-10-CM | POA: Diagnosis not present

## 2018-04-12 DIAGNOSIS — M80052D Age-related osteoporosis with current pathological fracture, left femur, subsequent encounter for fracture with routine healing: Secondary | ICD-10-CM | POA: Diagnosis not present

## 2018-04-12 DIAGNOSIS — D62 Acute posthemorrhagic anemia: Secondary | ICD-10-CM | POA: Diagnosis not present

## 2018-04-12 DIAGNOSIS — S0003XA Contusion of scalp, initial encounter: Secondary | ICD-10-CM

## 2018-04-12 LAB — BASIC METABOLIC PANEL
BUN: 21 mg/dL (ref 6–23)
CO2: 26 mEq/L (ref 19–32)
Calcium: 9.4 mg/dL (ref 8.4–10.5)
Chloride: 101 mEq/L (ref 96–112)
Creatinine, Ser: 0.87 mg/dL (ref 0.40–1.20)
GFR: 59.93 mL/min — ABNORMAL LOW (ref >60.00)
Glucose, Bld: 87 mg/dL (ref 70–99)
Potassium: 4.2 mEq/L (ref 3.5–5.1)
SODIUM: 136 meq/L (ref 135–145)

## 2018-04-12 LAB — CBC WITH DIFFERENTIAL/PLATELET
Basophils Absolute: 0 10*3/uL (ref 0.0–0.1)
Basophils Relative: 0.4 % (ref 0.0–3.0)
Eosinophils Absolute: 0.1 10*3/uL (ref 0.0–0.7)
Eosinophils Relative: 1.1 % (ref 0.0–5.0)
HCT: 33.5 % — ABNORMAL LOW (ref 36.0–46.0)
HEMOGLOBIN: 11 g/dL — AB (ref 12.0–15.0)
LYMPHS PCT: 16.5 % (ref 12.0–46.0)
Lymphs Abs: 1.1 10*3/uL (ref 0.7–4.0)
MCHC: 33 g/dL (ref 30.0–36.0)
MCV: 97.1 fl (ref 78.0–100.0)
MONOS PCT: 9.9 % (ref 3.0–12.0)
Monocytes Absolute: 0.7 10*3/uL (ref 0.1–1.0)
Neutro Abs: 4.9 10*3/uL (ref 1.4–7.7)
Neutrophils Relative %: 72.1 % (ref 43.0–77.0)
Platelets: 342 10*3/uL (ref 150.0–400.0)
RBC: 3.45 Mil/uL — ABNORMAL LOW (ref 3.87–5.11)
RDW: 14.9 % (ref 11.5–15.5)
WBC: 6.9 10*3/uL (ref 4.0–10.5)

## 2018-04-12 MED ORDER — CYANOCOBALAMIN 1000 MCG/ML IJ SOLN
1000.0000 ug | Freq: Once | INTRAMUSCULAR | Status: AC
  Administered 2018-04-12: 1000 ug via INTRAMUSCULAR

## 2018-04-12 MED ORDER — POLYSACCHARIDE IRON COMPLEX 150 MG PO CAPS: 150.0000 mg | ORAL_CAPSULE | Freq: Two times a day (BID) | ORAL | 3 refills | Status: AC

## 2018-04-12 NOTE — Assessment & Plan Note (Signed)
Ongoing and seen on abd films in the hospital  Recommend use of miralax- daily to start then titrate up to desired effect  Also can try magnesium otc low dose as tolerated  Disc need for much better fluid and fiber intake as well Disc with her son who cares for her

## 2018-04-12 NOTE — Assessment & Plan Note (Signed)
Despite femoral neck fx still declines eval or tx due to age

## 2018-04-12 NOTE — Telephone Encounter (Signed)
It is ok to take tramadol with tylenol  Tramadol can sedate and cause dizziness/imbalance so use caution (it is also habit forming and can worsen constipation)  I would reserve tramadol for more severe pain days and tylenol otherwise on the schedule we discussed (but it is ok for both of these medicines to be in her system at the same time)- over lap is not a problem

## 2018-04-12 NOTE — Assessment & Plan Note (Signed)
Chronic thoracic and lumbar due to deg changes  Ortho recently px tramadol (enc safety with this)  Also takes tylenol

## 2018-04-12 NOTE — Assessment & Plan Note (Addendum)
Reviewed hospital records, lab results and studies in detail   Left side  S/p hemiarthroplasty Doing fairly well after rehab stay  C/o back pain more than hip pain  Disc fall prevention -PT is working with her in the home Continues orthopedic f/u

## 2018-04-12 NOTE — Assessment & Plan Note (Signed)
From fall Reviewed hospital records, lab results and studies in detail   Rev CT result  Improved  Some headache- but this was chronic

## 2018-04-12 NOTE — Assessment & Plan Note (Signed)
Baseline iron def superimposed with post op anemia  Continues niferex 150 bid  Disc prev of constipation with this  Cbc today

## 2018-04-12 NOTE — Assessment & Plan Note (Signed)
bp in fair control at this time  BP Readings from Last 1 Encounters:  04/12/18 118/64   No changes needed Most recent labs reviewed  Disc lifstyle change with low sodium diet and exercise

## 2018-04-12 NOTE — Assessment & Plan Note (Signed)
Acute on chronic with head injury in December  Reassuring exam  Disc use of tylenol  Also better fluid intake May try magnesium 250 mg otc if tolerated

## 2018-04-12 NOTE — Progress Notes (Signed)
Subjective:    Patient ID: Chloe Cooper, female    DOB: [birth year], 85 y.o.   MRN: [id number]  HPI  Here for f/u from hosp/rehab with L hip fracture s/p hemiarthroplasty Was hospitalized 12/19 to 12/24 Presented after a fall - sustained minimially displaced L FN fx   Pt states she fell back on the porch  Also hit back of her head   Hospital course as follows: HOSPITAL COURSE:   Left femoral neck fracture secondary to fall at home Status post surgery 12/20.  Patient remained stable.  Pain is reasonably well controlled.  Bowel regimen.  Aspirin for DVT prophylaxis per orthopedics.  Seen by PT and OT.  Plan is for her to go to skilled nursing facility for rehab.  Left scalp hematoma This is as a result of the fall. CT scan imaging of the brain showed no intracranial bleed.  Anemia likely due to acute blood loss Hemoglobin did drop most likely due to operative loss.  Stable.  Recheck counts at skilled nursing facility.  She has baseline anemia  Still on iron bid   Mild postop delirium Resolved.  Patient feels well this morning.  Continues to have some discomfort in the left leg.  No shortness of breath or chest pain.  Overall stable.  Okay for discharge to skilled nursing facility.  She is DNR.    Chemistry      Component Value Date/Time   NA 135 02/25/2018 0232   K 4.2 02/25/2018 0232   CL 100 02/25/2018 0232   CO2 23 02/25/2018 0232   BUN 14 02/25/2018 0232   CREATININE 0.95 02/25/2018 0232      Component Value Date/Time   CALCIUM 9.0 02/25/2018 0232   ALKPHOS 88 02/13/2018 1452   AST 10 02/13/2018 1452   ALT 5 02/13/2018 1452   BILITOT 0.3 02/13/2018 1452      Lab Results  Component Value Date   WBC 8.5 02/27/2018   HGB 8.5 (L) 02/27/2018   HCT 26.9 (L) 02/27/2018   MCV 99.3 02/27/2018   PLT 243 02/27/2018  baseline Hb is around 10  Lab Results  Component Value Date   FERRITIN 37.5 02/13/2018  low normal     CXR no acute changes aside from  bi basilar atelectasis TS - kyphosciliosis  L knee- chondrocalcinosis  abd film- constipation  CT head- scalp hematoma   She was d/c to SNF Is glad to be home  Appetite is starting to come back now (eats more earlier in the day) - 2 good meals and a snack   Wt Readings from Last 3 Encounters:  02/25/18 123 lb 7.3 oz (56 kg)  02/13/18 123 lb 12 oz (56.1 kg)  11/02/17 124 lb (56.2 kg)   BP Readings from Last 3 Encounters:  04/12/18 118/64  02/28/18 123/64  02/13/18 108/60   Pulse Readings from Last 3 Encounters:  04/12/18 73  02/28/18 88  02/13/18 81     saw orthopedics yesterday  Noted arthritis in spine and gave her some pain medicine - unsure what it was  [facility]- Dr Luiz Blare  She continues to c/o back pain  Has not started pain pill yet  Is taking tylenol every 6 hours now (watching dosing) That helps some    Someone stays with her all the time  PT has helped with fall prevention   Patient Active Problem List   Diagnosis Date Noted  . Fall at home, initial encounter 02/24/2018  . Hematoma  of left parietal scalp 02/24/2018  . Prolonged QT interval 02/24/2018  . Pressure injury of skin 02/24/2018  . Femoral neck fracture (HCC) 02/23/2018  . Productive cough 02/13/2018  . Abdominal pain, left lower quadrant 02/13/2018  . Acute cystitis without hematuria 08/26/2017  . Intertrigo 04/19/2017  . B12 deficiency 12/24/2015  . Anemia 11/25/2015  . Burning sensation of mouth 04/22/2015  . Lumbar pain 10/30/2014  . Bilateral knee pain 10/30/2014  . Ingrown toenail 07/17/2014  . Encounter for Medicare annual wellness exam 02/23/2013  . Headache 06/09/2012  . Recurrent UTI 05/25/2011  . Back pain, thoracic 11/11/2010  . Thoracic back pain 11/07/2010  . Constipation 09/30/2010  . Osteoarthritis 08/21/2010  . Vitamin D deficiency 10/08/2008  . ESSENTIAL HYPERTENSION, BENIGN 10/02/2008  . HYPERCHOLESTEROLEMIA 07/19/2006  . Osteoporosis 07/19/2006   Past  Medical History:  Diagnosis Date  . Arthritis    Osteoarthritis hands and knees/?RA  . Baker's cyst   . Cataract   . Degenerative disc disease, lumbar    some spinal stenosis  . Diverticulitis 2009  . Gallstones 2009  . GERD (gastroesophageal reflux disease)   . Hyperlipidemia   . Osteoporosis   . Recurrent UTI   . Shingles   . Wrist fracture, right 2008   Past Surgical History:  Procedure Laterality Date  . APPENDECTOMY    . BREAST SURGERY     cyst removed left breast  . cataracts surg     bil  . CHOLECYSTECTOMY  2012   . HIP ARTHROPLASTY Left 02/24/2018   Procedure: ARTHROPLASTY (HEMIARTHROPLASTY) non cement;  Surgeon: Jodi Geralds, MD;  Location: [facility] OR;  Service: Orthopedics;  Laterality: Left;  . PARTIAL HYSTERECTOMY    . TRIGGER FINGER RELEASE     bil hands  . tumor removed     removed from lining of heart   Social History   Tobacco Use  . Smoking status: Never Smoker  . Smokeless tobacco: Never Used  Substance Use Topics  . Alcohol use: No    Alcohol/week: 0.0 standard drinks  . Drug use: No   Family History  Problem Relation Age of Onset  . Heart disease Father        CAD  . Heart disease Brother        CAD  . Heart disease Brother        CAD  . Diverticulitis Sister    Allergies  Allergen Reactions  . Alendronate Sodium Swelling    REACTION: GI  . Penicillins     REACTION: rash  . Simvastatin     Leg pain and cramping   . Sulfonamide Derivatives     REACTION: rash   Current Outpatient Medications on File Prior to Visit  Medication Sig Dispense Refill  . acetaminophen (TYLENOL) 500 MG tablet Take 500 mg by mouth every 8 (eight) hours as needed for mild pain.     . Cholecalciferol (VITAMIN D3 PO) Take 1 capsule by mouth daily.    . fluticasone (FLONASE) 50 MCG/ACT nasal spray Place 1 spray into both nostrils daily. (Patient taking differently: Place 1 spray into both nostrils daily as needed. ) 16 g 1  . Hypromellose ([employer] GENTLE LUBRICANT EYE  DROPS OP) Apply 2 drops to eye daily as needed (dry eyes).     . polyethylene glycol (MIRALAX / GLYCOLAX) packet Take 17 g by mouth daily. 14 each 0  . ranitidine (ZANTAC) 75 MG tablet Take 75 mg by mouth daily as needed for heartburn.  No current facility-administered medications on file prior to visit.      Review of Systems  Constitutional: Positive for fatigue. Negative for activity change, appetite change, fever and unexpected weight change.  HENT: Negative for congestion, ear pain, rhinorrhea, sinus pressure and sore throat.   Eyes: Negative for pain, redness and visual disturbance.  Respiratory: Negative for cough, shortness of breath and wheezing.   Cardiovascular: Negative for chest pain and palpitations.  Gastrointestinal: Negative for abdominal pain, blood in stool, constipation and diarrhea.  Endocrine: Negative for polydipsia and polyuria.  Genitourinary: Negative for dysuria, frequency and urgency.  Musculoskeletal: Positive for arthralgias, back pain and gait problem. Negative for myalgias.  Skin: Negative for pallor and rash.  Allergic/Immunologic: Negative for environmental allergies.  Neurological: Positive for headaches. Negative for dizziness and syncope.       Some headaches on and off  Area where she hit head feels ok   Hematological: Negative for adenopathy. Does not bruise/bleed easily.  Psychiatric/Behavioral: Negative for decreased concentration and dysphoric mood. The patient is not nervous/anxious.        Objective:   Physical Exam Constitutional:      General: She is not in acute distress.    Appearance: Normal appearance. She is normal weight.  HENT:     Head: Normocephalic and atraumatic.     Nose: Nose normal.     Mouth/Throat:     Mouth: Mucous membranes are moist.     Pharynx: Oropharynx is clear.  Eyes:     Extraocular Movements: Extraocular movements intact.     Conjunctiva/sclera: Conjunctivae normal.     Pupils: Pupils are equal,  round, and reactive to light.  Neck:     Musculoskeletal: Normal range of motion. No neck rigidity.  Cardiovascular:     Rate and Rhythm: Normal rate and regular rhythm.     Heart sounds: Normal heart sounds.  Pulmonary:     Effort: Pulmonary effort is normal. No respiratory distress.     Breath sounds: Normal breath sounds. No stridor. No wheezing, rhonchi or rales.  Abdominal:     General: Bowel sounds are normal. There is no distension.     Palpations: Abdomen is soft. There is no mass.     Tenderness: There is no abdominal tenderness.  Musculoskeletal:     Right lower leg: No edema.     Left lower leg: No edema.     Comments: Kyphosis  TS and LS tenderness diffusely and limited rom    Lymphadenopathy:     Cervical: No cervical adenopathy.  Skin:    General: Skin is warm and dry.     Coloration: Skin is not pale.     Findings: No rash.  Neurological:     Mental Status: She is alert. Mental status is at baseline.     Coordination: Coordination normal.     Deep Tendon Reflexes: Reflexes normal.     Comments: Walks slowly with cane  Psychiatric:        Mood and Affect: Affect is blunt.        Behavior: Behavior is slowed.     Comments: Blunt affect today  Answers questions appropriately            Assessment & Plan:   Problem List Items Addressed This Visit      Cardiovascular and Mediastinum   ESSENTIAL HYPERTENSION, BENIGN - Primary    bp in fair control at this time  BP Readings from Last 1 Encounters:  04/12/18  118/64   No changes needed Most recent labs reviewed  Disc lifstyle change with low sodium diet and exercise        Relevant Orders   Basic metabolic panel (Completed)     Musculoskeletal and Integument   Osteoporosis    Despite femoral neck fx still declines eval or tx due to age      Femoral neck fracture [facility](HCC)    Reviewed hospital records, lab results and studies in detail   Left side  S/p hemiarthroplasty Doing fairly well after  rehab stay  C/o back pain more than hip pain  Disc fall prevention -PT is working with her in the home Continues orthopedic f/u         Other   Constipation    Ongoing and seen on abd films in the hospital  Recommend use of miralax- daily to start then titrate up to desired effect  Also can try magnesium otc low dose as tolerated  Disc need for much better fluid and fiber intake as well Disc with her son who cares for her       Back pain, thoracic    Chronic thoracic and lumbar due to deg changes  Ortho recently px tramadol (enc safety with this)  Also takes tylenol       Headache    Acute on chronic with head injury in December  Reassuring exam  Disc use of tylenol  Also better fluid intake May try magnesium 250 mg otc if tolerated       Anemia    Baseline iron def superimposed with post op anemia  Continues niferex 150 bid  Disc prev of constipation with this  Cbc today       Relevant Medications   iron polysaccharides (FERREX 150) 150 MG capsule   cyanocobalamin ((VITAMIN B-12)) injection 1,000 mcg (Completed)   Other Relevant Orders   CBC with Differential/Platelet (Completed)   B12 deficiency    B12 shot today      Relevant Medications   cyanocobalamin ((VITAMIN B-12)) injection 1,000 mcg (Completed)   Hematoma of left parietal scalp    From fall Reviewed hospital records, lab results and studies in detail   Rev CT result  Improved  Some headache- but this was chronic

## 2018-04-12 NOTE — Assessment & Plan Note (Signed)
B12 shot today. 

## 2018-04-12 NOTE — Telephone Encounter (Signed)
Son notified of Dr. Royden Purl comments and verbalized understanding

## 2018-04-12 NOTE — Telephone Encounter (Signed)
Patient was seen today. Dr.Graves prescribed Tramadol every 12 hours  for patient.  [name] wants to know if it's okay for her to take the Tramadol and  how to give Tylenol with the Tramadol. Please call [name] back with response.

## 2018-04-12 NOTE — Patient Instructions (Addendum)
Aim for 64 oz of fluid a day- mostly water  Also fruits and vegetables (bananas don't count) This is important for constipation   Do not try to self disimpact stool on your own -this can cause injury   A dose of magnesium 250 mg daily may help with both headache and constipation   Start with miralax as directed (mixed with fluid of choice) once daily  You can gradually increase this to the dose that works - some people need 3-4 doses per day  Work with it and see how it goes   Let me know what pain medication you got from orthopedics   Tylenol is ok for pain   Continue physical therapy   Labs today   B12 shot today

## 2018-04-13 ENCOUNTER — Encounter: Payer: Self-pay | Admitting: *Deleted

## 2018-04-13 DIAGNOSIS — Z792 Long term (current) use of antibiotics: Secondary | ICD-10-CM | POA: Diagnosis not present

## 2018-04-13 DIAGNOSIS — D62 Acute posthemorrhagic anemia: Secondary | ICD-10-CM | POA: Diagnosis not present

## 2018-04-13 DIAGNOSIS — Z9181 History of falling: Secondary | ICD-10-CM | POA: Diagnosis not present

## 2018-04-13 DIAGNOSIS — Z7982 Long term (current) use of aspirin: Secondary | ICD-10-CM | POA: Diagnosis not present

## 2018-04-13 DIAGNOSIS — M1990 Unspecified osteoarthritis, unspecified site: Secondary | ICD-10-CM | POA: Diagnosis not present

## 2018-04-13 DIAGNOSIS — M80052D Age-related osteoporosis with current pathological fracture, left femur, subsequent encounter for fracture with routine healing: Secondary | ICD-10-CM | POA: Diagnosis not present

## 2018-04-13 DIAGNOSIS — I10 Essential (primary) hypertension: Secondary | ICD-10-CM | POA: Diagnosis not present

## 2018-04-13 DIAGNOSIS — W19XXXD Unspecified fall, subsequent encounter: Secondary | ICD-10-CM | POA: Diagnosis not present

## 2018-04-13 DIAGNOSIS — Z96642 Presence of left artificial hip joint: Secondary | ICD-10-CM | POA: Diagnosis not present

## 2018-04-14 ENCOUNTER — Telehealth: Payer: Self-pay

## 2018-04-14 NOTE — Telephone Encounter (Signed)
Do not give any more tramadol  Please put in her med allergy/intol list  Stick with the tylenol

## 2018-04-14 NOTE — Telephone Encounter (Signed)
Pt's son, Kathlene November (on dpr), is calling wanting to make Dr. Milinda Antis aware pt had hip surgery with Dr. Luiz Blare.  Says she was prescribed tramadol to take BID.  However, he states med is causing pt to talk "out of herself".  She is talking about wanting to kill herself among other strange behavior. He has stopped giving her the tramadol and restarted Tylenol 500 mg q6h.  Asking if there is anything else pt can be given.  Plz advise. [facility] gives permission to lvm at [phone].]

## 2018-04-14 NOTE — Telephone Encounter (Signed)
Pt's son, Kathlene November (on dpr), called asking what else can pt take for headaches she's having.  He is giving her Tylenol but it's not helping. Pls advise pt's son at [phone].

## 2018-04-14 NOTE — Telephone Encounter (Signed)
Med added to allergy list, pt's son notified of Dr. Royden Purl comments and verbalize understanding

## 2018-04-15 DIAGNOSIS — D62 Acute posthemorrhagic anemia: Secondary | ICD-10-CM | POA: Diagnosis not present

## 2018-04-15 DIAGNOSIS — W19XXXD Unspecified fall, subsequent encounter: Secondary | ICD-10-CM | POA: Diagnosis not present

## 2018-04-15 DIAGNOSIS — I10 Essential (primary) hypertension: Secondary | ICD-10-CM | POA: Diagnosis not present

## 2018-04-15 DIAGNOSIS — Z7982 Long term (current) use of aspirin: Secondary | ICD-10-CM | POA: Diagnosis not present

## 2018-04-15 DIAGNOSIS — M1990 Unspecified osteoarthritis, unspecified site: Secondary | ICD-10-CM | POA: Diagnosis not present

## 2018-04-15 DIAGNOSIS — Z96642 Presence of left artificial hip joint: Secondary | ICD-10-CM | POA: Diagnosis not present

## 2018-04-15 DIAGNOSIS — M80052D Age-related osteoporosis with current pathological fracture, left femur, subsequent encounter for fracture with routine healing: Secondary | ICD-10-CM | POA: Diagnosis not present

## 2018-04-15 DIAGNOSIS — Z792 Long term (current) use of antibiotics: Secondary | ICD-10-CM | POA: Diagnosis not present

## 2018-04-15 DIAGNOSIS — Z9181 History of falling: Secondary | ICD-10-CM | POA: Diagnosis not present

## 2018-04-15 NOTE — Telephone Encounter (Signed)
With caution we could try some ibuprofen over the counter -200 to 400 mg with a meal up to every 8-12 hours Use with caution -not generally recommended for her age  This can cause GI bleeding so if any GI discomfort with this stop it   For short term this may help  Let me know

## 2018-04-17 DIAGNOSIS — Z792 Long term (current) use of antibiotics: Secondary | ICD-10-CM | POA: Diagnosis not present

## 2018-04-17 DIAGNOSIS — M1990 Unspecified osteoarthritis, unspecified site: Secondary | ICD-10-CM | POA: Diagnosis not present

## 2018-04-17 DIAGNOSIS — W19XXXD Unspecified fall, subsequent encounter: Secondary | ICD-10-CM | POA: Diagnosis not present

## 2018-04-17 DIAGNOSIS — Z9181 History of falling: Secondary | ICD-10-CM | POA: Diagnosis not present

## 2018-04-17 DIAGNOSIS — Z96642 Presence of left artificial hip joint: Secondary | ICD-10-CM | POA: Diagnosis not present

## 2018-04-17 DIAGNOSIS — M80052D Age-related osteoporosis with current pathological fracture, left femur, subsequent encounter for fracture with routine healing: Secondary | ICD-10-CM | POA: Diagnosis not present

## 2018-04-17 DIAGNOSIS — I10 Essential (primary) hypertension: Secondary | ICD-10-CM | POA: Diagnosis not present

## 2018-04-17 DIAGNOSIS — D62 Acute posthemorrhagic anemia: Secondary | ICD-10-CM | POA: Diagnosis not present

## 2018-04-17 DIAGNOSIS — Z7982 Long term (current) use of aspirin: Secondary | ICD-10-CM | POA: Diagnosis not present

## 2018-04-17 NOTE — Telephone Encounter (Signed)
Pt's son wanted me to let Dr. Milinda Antis know that they do not want to try the ibuprofen due to the GI side effs. Pt's son did want me to let Dr. Milinda Antis know that they think some of the pain is "in her head" pt will say she's in pain every 30 min or so and ask for a pain pill. They have started waiting until she ask for a pain pill and will give her one of her irons tabs and she says that it has helped her pain. They said that if she starts to complain about pain and it's not time for her tylenol or an iron pill they have been giving her a 81mg  asprin, and that seems to help. Son said between the tylenol, iron, and 81mg  asa, pt seems to be stable so that is the regiment son wants to follow, he said if anything changes he will let us know but for now he's ok

## 2018-04-17 NOTE — Telephone Encounter (Signed)
Thanks- please keep me posted  I appreciate it

## 2018-04-18 DIAGNOSIS — Z9181 History of falling: Secondary | ICD-10-CM | POA: Diagnosis not present

## 2018-04-18 DIAGNOSIS — W19XXXD Unspecified fall, subsequent encounter: Secondary | ICD-10-CM | POA: Diagnosis not present

## 2018-04-18 DIAGNOSIS — Z792 Long term (current) use of antibiotics: Secondary | ICD-10-CM | POA: Diagnosis not present

## 2018-04-18 DIAGNOSIS — M80052D Age-related osteoporosis with current pathological fracture, left femur, subsequent encounter for fracture with routine healing: Secondary | ICD-10-CM | POA: Diagnosis not present

## 2018-04-18 DIAGNOSIS — Z7982 Long term (current) use of aspirin: Secondary | ICD-10-CM | POA: Diagnosis not present

## 2018-04-18 DIAGNOSIS — D62 Acute posthemorrhagic anemia: Secondary | ICD-10-CM | POA: Diagnosis not present

## 2018-04-18 DIAGNOSIS — I10 Essential (primary) hypertension: Secondary | ICD-10-CM | POA: Diagnosis not present

## 2018-04-18 DIAGNOSIS — Z96642 Presence of left artificial hip joint: Secondary | ICD-10-CM | POA: Diagnosis not present

## 2018-04-18 DIAGNOSIS — M1990 Unspecified osteoarthritis, unspecified site: Secondary | ICD-10-CM | POA: Diagnosis not present

## 2018-04-19 DIAGNOSIS — Z9181 History of falling: Secondary | ICD-10-CM | POA: Diagnosis not present

## 2018-04-19 DIAGNOSIS — Z96642 Presence of left artificial hip joint: Secondary | ICD-10-CM | POA: Diagnosis not present

## 2018-04-19 DIAGNOSIS — D62 Acute posthemorrhagic anemia: Secondary | ICD-10-CM | POA: Diagnosis not present

## 2018-04-19 DIAGNOSIS — Z792 Long term (current) use of antibiotics: Secondary | ICD-10-CM | POA: Diagnosis not present

## 2018-04-19 DIAGNOSIS — Z7982 Long term (current) use of aspirin: Secondary | ICD-10-CM | POA: Diagnosis not present

## 2018-04-19 DIAGNOSIS — M80052D Age-related osteoporosis with current pathological fracture, left femur, subsequent encounter for fracture with routine healing: Secondary | ICD-10-CM | POA: Diagnosis not present

## 2018-04-19 DIAGNOSIS — I10 Essential (primary) hypertension: Secondary | ICD-10-CM | POA: Diagnosis not present

## 2018-04-19 DIAGNOSIS — M1990 Unspecified osteoarthritis, unspecified site: Secondary | ICD-10-CM | POA: Diagnosis not present

## 2018-04-19 DIAGNOSIS — W19XXXD Unspecified fall, subsequent encounter: Secondary | ICD-10-CM | POA: Diagnosis not present

## 2018-04-20 DIAGNOSIS — M80052D Age-related osteoporosis with current pathological fracture, left femur, subsequent encounter for fracture with routine healing: Secondary | ICD-10-CM | POA: Diagnosis not present

## 2018-04-20 DIAGNOSIS — M1990 Unspecified osteoarthritis, unspecified site: Secondary | ICD-10-CM | POA: Diagnosis not present

## 2018-04-20 DIAGNOSIS — Z9181 History of falling: Secondary | ICD-10-CM | POA: Diagnosis not present

## 2018-04-20 DIAGNOSIS — Z792 Long term (current) use of antibiotics: Secondary | ICD-10-CM | POA: Diagnosis not present

## 2018-04-20 DIAGNOSIS — Z96642 Presence of left artificial hip joint: Secondary | ICD-10-CM | POA: Diagnosis not present

## 2018-04-20 DIAGNOSIS — D62 Acute posthemorrhagic anemia: Secondary | ICD-10-CM | POA: Diagnosis not present

## 2018-04-20 DIAGNOSIS — W19XXXD Unspecified fall, subsequent encounter: Secondary | ICD-10-CM | POA: Diagnosis not present

## 2018-04-20 DIAGNOSIS — I10 Essential (primary) hypertension: Secondary | ICD-10-CM | POA: Diagnosis not present

## 2018-04-20 DIAGNOSIS — Z7982 Long term (current) use of aspirin: Secondary | ICD-10-CM | POA: Diagnosis not present

## 2018-04-21 DIAGNOSIS — D62 Acute posthemorrhagic anemia: Secondary | ICD-10-CM | POA: Diagnosis not present

## 2018-04-21 DIAGNOSIS — M1990 Unspecified osteoarthritis, unspecified site: Secondary | ICD-10-CM | POA: Diagnosis not present

## 2018-04-21 DIAGNOSIS — Z7982 Long term (current) use of aspirin: Secondary | ICD-10-CM | POA: Diagnosis not present

## 2018-04-21 DIAGNOSIS — Z96642 Presence of left artificial hip joint: Secondary | ICD-10-CM | POA: Diagnosis not present

## 2018-04-21 DIAGNOSIS — Z9181 History of falling: Secondary | ICD-10-CM | POA: Diagnosis not present

## 2018-04-21 DIAGNOSIS — W19XXXD Unspecified fall, subsequent encounter: Secondary | ICD-10-CM | POA: Diagnosis not present

## 2018-04-21 DIAGNOSIS — I10 Essential (primary) hypertension: Secondary | ICD-10-CM | POA: Diagnosis not present

## 2018-04-21 DIAGNOSIS — M80052D Age-related osteoporosis with current pathological fracture, left femur, subsequent encounter for fracture with routine healing: Secondary | ICD-10-CM | POA: Diagnosis not present

## 2018-04-21 DIAGNOSIS — Z792 Long term (current) use of antibiotics: Secondary | ICD-10-CM | POA: Diagnosis not present

## 2018-04-22 DIAGNOSIS — M80052D Age-related osteoporosis with current pathological fracture, left femur, subsequent encounter for fracture with routine healing: Secondary | ICD-10-CM | POA: Diagnosis not present

## 2018-04-22 DIAGNOSIS — I10 Essential (primary) hypertension: Secondary | ICD-10-CM | POA: Diagnosis not present

## 2018-04-22 DIAGNOSIS — W19XXXD Unspecified fall, subsequent encounter: Secondary | ICD-10-CM | POA: Diagnosis not present

## 2018-04-22 DIAGNOSIS — Z96642 Presence of left artificial hip joint: Secondary | ICD-10-CM | POA: Diagnosis not present

## 2018-04-22 DIAGNOSIS — M1990 Unspecified osteoarthritis, unspecified site: Secondary | ICD-10-CM | POA: Diagnosis not present

## 2018-04-22 DIAGNOSIS — D62 Acute posthemorrhagic anemia: Secondary | ICD-10-CM | POA: Diagnosis not present

## 2018-04-22 DIAGNOSIS — Z9181 History of falling: Secondary | ICD-10-CM | POA: Diagnosis not present

## 2018-04-22 DIAGNOSIS — Z792 Long term (current) use of antibiotics: Secondary | ICD-10-CM | POA: Diagnosis not present

## 2018-04-22 DIAGNOSIS — Z7982 Long term (current) use of aspirin: Secondary | ICD-10-CM | POA: Diagnosis not present

## 2018-04-24 DIAGNOSIS — Z96642 Presence of left artificial hip joint: Secondary | ICD-10-CM | POA: Diagnosis not present

## 2018-04-24 DIAGNOSIS — D62 Acute posthemorrhagic anemia: Secondary | ICD-10-CM | POA: Diagnosis not present

## 2018-04-24 DIAGNOSIS — Z792 Long term (current) use of antibiotics: Secondary | ICD-10-CM | POA: Diagnosis not present

## 2018-04-24 DIAGNOSIS — I10 Essential (primary) hypertension: Secondary | ICD-10-CM | POA: Diagnosis not present

## 2018-04-24 DIAGNOSIS — Z9181 History of falling: Secondary | ICD-10-CM | POA: Diagnosis not present

## 2018-04-24 DIAGNOSIS — W19XXXD Unspecified fall, subsequent encounter: Secondary | ICD-10-CM | POA: Diagnosis not present

## 2018-04-24 DIAGNOSIS — M1990 Unspecified osteoarthritis, unspecified site: Secondary | ICD-10-CM | POA: Diagnosis not present

## 2018-04-24 DIAGNOSIS — Z7982 Long term (current) use of aspirin: Secondary | ICD-10-CM | POA: Diagnosis not present

## 2018-04-24 DIAGNOSIS — M80052D Age-related osteoporosis with current pathological fracture, left femur, subsequent encounter for fracture with routine healing: Secondary | ICD-10-CM | POA: Diagnosis not present

## 2018-04-25 ENCOUNTER — Emergency Department (HOSPITAL_COMMUNITY): Payer: Medicare Other

## 2018-04-25 ENCOUNTER — Emergency Department (HOSPITAL_COMMUNITY)
Admission: EM | Admit: 2018-04-25 | Discharge: 2018-04-25 | Disposition: A | Discharge status: Home / Self Care | Payer: Medicare Other | Attending: Emergency Medicine | Admitting: Emergency Medicine

## 2018-04-25 DIAGNOSIS — M1990 Unspecified osteoarthritis, unspecified site: Secondary | ICD-10-CM | POA: Diagnosis not present

## 2018-04-25 DIAGNOSIS — Z7982 Long term (current) use of aspirin: Secondary | ICD-10-CM | POA: Diagnosis not present

## 2018-04-25 DIAGNOSIS — Z79899 Other long term (current) drug therapy: Secondary | ICD-10-CM | POA: Insufficient documentation

## 2018-04-25 DIAGNOSIS — R52 Pain, unspecified: Secondary | ICD-10-CM | POA: Diagnosis not present

## 2018-04-25 DIAGNOSIS — R519 Headache, unspecified: Secondary | ICD-10-CM

## 2018-04-25 DIAGNOSIS — I10 Essential (primary) hypertension: Secondary | ICD-10-CM | POA: Diagnosis not present

## 2018-04-25 DIAGNOSIS — Z96642 Presence of left artificial hip joint: Secondary | ICD-10-CM | POA: Diagnosis not present

## 2018-04-25 DIAGNOSIS — M80052D Age-related osteoporosis with current pathological fracture, left femur, subsequent encounter for fracture with routine healing: Secondary | ICD-10-CM | POA: Diagnosis not present

## 2018-04-25 DIAGNOSIS — R51 Headache: Secondary | ICD-10-CM | POA: Insufficient documentation

## 2018-04-25 DIAGNOSIS — Z9181 History of falling: Secondary | ICD-10-CM | POA: Diagnosis not present

## 2018-04-25 DIAGNOSIS — R531 Weakness: Secondary | ICD-10-CM | POA: Diagnosis not present

## 2018-04-25 DIAGNOSIS — D62 Acute posthemorrhagic anemia: Secondary | ICD-10-CM | POA: Diagnosis not present

## 2018-04-25 DIAGNOSIS — Z792 Long term (current) use of antibiotics: Secondary | ICD-10-CM | POA: Diagnosis not present

## 2018-04-25 DIAGNOSIS — W19XXXD Unspecified fall, subsequent encounter: Secondary | ICD-10-CM | POA: Diagnosis not present

## 2018-04-25 DIAGNOSIS — I959 Hypotension, unspecified: Secondary | ICD-10-CM | POA: Diagnosis not present

## 2018-04-25 MED ORDER — ACETAMINOPHEN-CODEINE #3 300-30 MG PO TABS: 1.0000 | ORAL_TABLET | Freq: Four times a day (QID) | ORAL | 0 refills | Status: DC | PRN

## 2018-04-25 NOTE — ED Triage Notes (Signed)
Pt BIB [facility]. Pt is coming from her own home. Pt complaining of a headache for three days now. Pt states that she is also having some chronic lower back pain. Pt reports that she has been tylenol at home and it has given her some relief. Pt is alert and oriented x4 upon arrival.

## 2018-04-25 NOTE — ED Provider Notes (Signed)
[facility] EMERGENCY DEPARTMENT Provider Note   CSN: [id number] Arrival date & time: 04/25/18  1745    History   Chief Complaint Chief Complaint  Patient presents with  . Headache    HPI Chloe C Lerer is a 85 y.o. female.     HPI Patient has a frontal headache.  Is aching and throbbing in quality.  Patient has had a headache off and on for several months since a fall.  She however had a CT scan did not have any bleeding at that time.  Typically, if she takes some Tylenol, and aspirin she feels better.  These have been helping the headache temporarily but then it keeps coming back again.  Patient son and patient were concerned because of the rebounding nature of the headache.  No confusion, no nausea, no vomiting, no fevers no chills no nasal drainage no sore throat no body aches.  No dizziness no imbalance no recurrence of falls. Past Medical History:  Diagnosis Date  . Arthritis    Osteoarthritis hands and knees/?RA  . Baker's cyst   . Cataract   . Degenerative disc disease, lumbar    some spinal stenosis  . Diverticulitis 2009  . Gallstones 2009  . GERD (gastroesophageal reflux disease)   . Hyperlipidemia   . Osteoporosis   . Recurrent UTI   . Shingles   . Wrist fracture, right 2008    Patient Active Problem List   Diagnosis Date Noted  . Fall at home, initial encounter 02/24/2018  . Hematoma of left parietal scalp 02/24/2018  . Prolonged QT interval 02/24/2018  . Femoral neck fracture (HCC) 02/23/2018  . B12 deficiency 12/24/2015  . Anemia 11/25/2015  . Burning sensation of mouth 04/22/2015  . Lumbar pain 10/30/2014  . Bilateral knee pain 10/30/2014  . Encounter for Medicare annual wellness exam 02/23/2013  . Headache 06/09/2012  . Recurrent UTI 05/25/2011  . Back pain, thoracic 11/11/2010  . Thoracic back pain 11/07/2010  . Constipation 09/30/2010  . Osteoarthritis 08/21/2010  . Vitamin D deficiency 10/08/2008  . ESSENTIAL  HYPERTENSION, BENIGN 10/02/2008  . HYPERCHOLESTEROLEMIA 07/19/2006  . Osteoporosis 07/19/2006    Past Surgical History:  Procedure Laterality Date  . APPENDECTOMY    . BREAST SURGERY     cyst removed left breast  . cataracts surg     bil  . CHOLECYSTECTOMY  2012   . HIP ARTHROPLASTY Left 02/24/2018   Procedure: ARTHROPLASTY (HEMIARTHROPLASTY) non cement;  Surgeon: Jodi Geralds, MD;  Location: [facility] OR;  Service: Orthopedics;  Laterality: Left;  . PARTIAL HYSTERECTOMY    . TRIGGER FINGER RELEASE     bil hands  . tumor removed     removed from lining of heart     OB History   No obstetric history on file.      Home Medications    Prior to Admission medications   Medication Sig Start Date End Date Taking? Authorizing Provider  acetaminophen (TYLENOL) 500 MG tablet Take 500 mg by mouth every 8 (eight) hours as needed for mild pain.     [provider]  acetaminophen-codeine (TYLENOL #3) 300-30 MG tablet Take 1-2 tablets by mouth every 6 (six) hours as needed for moderate pain. 04/25/18   Arby Barrette, MD  Cholecalciferol (VITAMIN D3 PO) Take 1 capsule by mouth daily.    [provider]  fluticasone (FLONASE) 50 MCG/ACT nasal spray Place 1 spray into both nostrils daily. Patient taking differently: Place 1 spray into both nostrils  daily as needed.  12/01/16   Tower, Audrie GallusMarne A, MD  Hypromellose ([employer] GENTLE LUBRICANT EYE DROPS OP) Apply 2 drops to eye daily as needed (dry eyes).     [provider]  iron polysaccharides (FERREX 150) 150 MG capsule Take 1 capsule (150 mg total) by mouth 2 (two) times daily. 04/12/18   Tower, Audrie GallusMarne A, MD  polyethylene glycol (MIRALAX / GLYCOLAX) packet Take 17 g by mouth daily. 02/28/18   Osvaldo ShipperKrishnan, Gokul, MD  ranitidine (ZANTAC) 75 MG tablet Take 75 mg by mouth daily as needed for heartburn.     [provider]    Family History Family History  Problem Relation Age of Onset  . Heart disease Father        CAD  .  Heart disease Brother        CAD  . Heart disease Brother        CAD  . Diverticulitis Sister     Social History Social History   Tobacco Use  . Smoking status: Never Smoker  . Smokeless tobacco: Never Used  Substance Use Topics  . Alcohol use: No    Alcohol/week: 0.0 standard drinks  . Drug use: No     Allergies   Alendronate sodium; Penicillins; Simvastatin; Sulfonamide derivatives; and Tramadol   Review of Systems Review of Systems 10 Systems reviewed and are negative for acute change except as noted in the HPI.  Physical Exam Updated Vital Signs BP (!) 113/50   Pulse 78   Temp 98.2 F (36.8 C)   Resp (!) 29   Ht 5' 0.5" (1.537 m)   Wt 50.3 kg   SpO2 99%   BMI 21.32 kg/m   Physical Exam Constitutional:      Appearance: Normal appearance. She is well-developed.  HENT:     Head: Normocephalic and atraumatic.  Eyes:     Pupils: Pupils are equal, round, and reactive to light.  Neck:     Musculoskeletal: Neck supple.  Cardiovascular:     Rate and Rhythm: Normal rate and regular rhythm.     Heart sounds: Normal heart sounds.  Pulmonary:     Effort: Pulmonary effort is normal.     Breath sounds: Normal breath sounds.  Abdominal:     General: Bowel sounds are normal. There is no distension.     Palpations: Abdomen is soft.     Tenderness: There is no abdominal tenderness.  Musculoskeletal: Normal range of motion.        General: No swelling or tenderness.  Skin:    General: Skin is warm and dry.  Neurological:     General: No focal deficit present.     Mental Status: She is alert and oriented to person, place, and time.     GCS: GCS eye subscore is 4. GCS verbal subscore is 5. GCS motor subscore is 6.     Cranial Nerves: No cranial nerve deficit.     Sensory: No sensory deficit.     Coordination: Coordination normal.     Comments: Normal motor strength.  Upper extremity 5\5.  Lower extremity 5\5.  Psychiatric:        Mood and Affect: Mood normal.       ED Treatments / Results  Labs (all labs ordered are listed, but only abnormal results are displayed) Labs Reviewed - No data to display  EKG None  Radiology Ct Head Wo Contrast  Result Date: 04/25/2018 CLINICAL DATA:  Headache 3 days EXAM: CT HEAD  WITHOUT CONTRAST TECHNIQUE: Contiguous axial images were obtained from the base of the skull through the vertex without intravenous contrast. COMPARISON:  CT head 02/23/2018 FINDINGS: Brain: Moderate frontal and temporal lobe atrophy unchanged. Negative for hydrocephalus. Mild chronic microvascular ischemic type changes in the white matter bilaterally. Negative for acute infarct, hemorrhage, or mass. Vascular: Atherosclerotic calcification. Negative for hyperdense vessel Skull: Negative for skull fracture or skull lesion Sinuses/Orbits: Mucosal edema right ethmoid sinus. No air-fluid levels. Bilateral cataract surgery Other: None IMPRESSION: No acute abnormality Frontal and temporal lobe edema unchanged. Mild chronic microvascular ischemia in the white matter. Electronically Signed   By: Marlan Palau M.D.   On: 04/25/2018 19:45    Procedures Procedures (including critical care time)  Medications Ordered in ED Medications - No data to display   Initial Impression / Assessment and Plan / ED Course  I have reviewed the triage vital signs and the nursing notes.  Pertinent labs & imaging results that were available during my care of the patient were reviewed by me and considered in my medical decision making (see chart for details).       Patient is alert and clinically well in appearance.  No signs of infectious etiology.  Headache is frontal in nature.  It does resolve with medications but returns frequently.  At this time, patient appears to have chronic headache after a more distant fall.  CT does not show any delayed bleeding or new acute findings.  Patient has very good mental status and neurologic function for age.  At this time  will advised to try 1-2 acetaminophen with codeine if needed for additional pain control.  Strongest recommendation is to follow-up with neurology or headache clinic to maximize safe headache treatment for a elderly but healthy individual.  Final Clinical Impressions(s) / ED Diagnoses   Final diagnoses:  Bad headache    ED Discharge Orders         Ordered    acetaminophen-codeine (TYLENOL #3) 300-30 MG tablet  Every 6 hours PRN     04/25/18 2049           Arby Barrette, MD 04/25/18 2052

## 2018-04-25 NOTE — ED Notes (Signed)
Patient verbalizes understanding of discharge instructions. Opportunity for questioning and answers were provided. Armband removed by staff, pt discharged from ED in wheelchair with family.  

## 2018-04-25 NOTE — Discharge Instructions (Addendum)
1.  You may try 1-2 Tylenol with codeine tablets to see if this is helpful for your headache. 2.  You seem to have chronic headaches since your head injury.  You should be seen by a neurologist to determine if you would benefit from certain medications and treatments for chronic headaches after minor head injury. 3.  Make an appointment to see your doctor for recheck this week to discuss ongoing medications for pain control and possible referral to neurology or a headache clinic.

## 2018-04-27 DIAGNOSIS — Z7982 Long term (current) use of aspirin: Secondary | ICD-10-CM | POA: Diagnosis not present

## 2018-04-27 DIAGNOSIS — M80052D Age-related osteoporosis with current pathological fracture, left femur, subsequent encounter for fracture with routine healing: Secondary | ICD-10-CM | POA: Diagnosis not present

## 2018-04-27 DIAGNOSIS — W19XXXD Unspecified fall, subsequent encounter: Secondary | ICD-10-CM | POA: Diagnosis not present

## 2018-04-27 DIAGNOSIS — Z96642 Presence of left artificial hip joint: Secondary | ICD-10-CM | POA: Diagnosis not present

## 2018-04-27 DIAGNOSIS — Z9181 History of falling: Secondary | ICD-10-CM | POA: Diagnosis not present

## 2018-04-27 DIAGNOSIS — M1990 Unspecified osteoarthritis, unspecified site: Secondary | ICD-10-CM | POA: Diagnosis not present

## 2018-04-27 DIAGNOSIS — I10 Essential (primary) hypertension: Secondary | ICD-10-CM | POA: Diagnosis not present

## 2018-04-27 DIAGNOSIS — Z792 Long term (current) use of antibiotics: Secondary | ICD-10-CM | POA: Diagnosis not present

## 2018-04-27 DIAGNOSIS — D62 Acute posthemorrhagic anemia: Secondary | ICD-10-CM | POA: Diagnosis not present

## 2018-04-28 ENCOUNTER — Telehealth: Payer: Self-pay | Admitting: Family Medicine

## 2018-04-28 DIAGNOSIS — Z96642 Presence of left artificial hip joint: Secondary | ICD-10-CM | POA: Diagnosis not present

## 2018-04-28 DIAGNOSIS — Z792 Long term (current) use of antibiotics: Secondary | ICD-10-CM | POA: Diagnosis not present

## 2018-04-28 DIAGNOSIS — M80052D Age-related osteoporosis with current pathological fracture, left femur, subsequent encounter for fracture with routine healing: Secondary | ICD-10-CM | POA: Diagnosis not present

## 2018-04-28 DIAGNOSIS — Z9181 History of falling: Secondary | ICD-10-CM | POA: Diagnosis not present

## 2018-04-28 DIAGNOSIS — Z7982 Long term (current) use of aspirin: Secondary | ICD-10-CM | POA: Diagnosis not present

## 2018-04-28 DIAGNOSIS — R51 Headache: Principal | ICD-10-CM

## 2018-04-28 DIAGNOSIS — I10 Essential (primary) hypertension: Secondary | ICD-10-CM | POA: Diagnosis not present

## 2018-04-28 DIAGNOSIS — D62 Acute posthemorrhagic anemia: Secondary | ICD-10-CM | POA: Diagnosis not present

## 2018-04-28 DIAGNOSIS — W19XXXD Unspecified fall, subsequent encounter: Secondary | ICD-10-CM | POA: Diagnosis not present

## 2018-04-28 DIAGNOSIS — M1990 Unspecified osteoarthritis, unspecified site: Secondary | ICD-10-CM | POA: Diagnosis not present

## 2018-04-28 DIAGNOSIS — R519 Headache, unspecified: Secondary | ICD-10-CM

## 2018-04-28 MED ORDER — ACETAMINOPHEN-CODEINE #3 300-30 MG PO TABS: 1.0000 | ORAL_TABLET | Freq: Four times a day (QID) | ORAL | 0 refills | Status: DC | PRN

## 2018-04-28 NOTE — Telephone Encounter (Signed)
Pt's son notified of Dr. Royden Purl comments and recommendations. Son said he doesn't want her on this med long term if possible so he would like a referral to neurology to see if they can office any other options. Son said once this Rx runs out he may just switch her back to 2 tabs of the 500mg  tylenol but maybe pt can see the neurologist 1st.  I advised son that Dr. Milinda Antis will put referral in and our [facility] will call to schedule appt

## 2018-04-28 NOTE — Telephone Encounter (Signed)
Spoke with son. He said he is the one who called and requested the med to be refilled. Pt's son said it has helped pt's HA a lot they ran out today and that's why he is requesting a refill, pt is still dealing with HA but once she takes the med it goes away. Son said the Rx was written that they could give her up to 2 tabs every 6 hrs but they are just giving her one tab every 6 hrs and that seems to be helping.   Son said only other choice they were thinking about was to give pt 2 tabs of the 500mg  of tylenol every 6 hrs but she would be maxed out on tylenol daily and that's why he wants to stick with this tylenol # if Dr. Milinda Antis is okay refilling   [facility] [address]

## 2018-04-28 NOTE — Telephone Encounter (Signed)
Name of Medication: Tylenol # 3 Name of Pharmacy: [facility] Last Fill or Written Date and Quantity: # 15 on 04/25/18 Last Office Visit and Type: 04/12/18 FU Next Office Visit and Type: none scheduled Last Controlled Substance Agreement Date: none noted Last WIO:MBTD noted

## 2018-04-28 NOTE — Telephone Encounter (Signed)
Please talk to her son before I refill  This was px in ED for headache  Please ask if it is helping? How is the heacache?   This medication has codeine- can cause dizziness/sedation/constipation  How often and many is she taking daily on average?

## 2018-04-28 NOTE — Telephone Encounter (Signed)
Copied from CRM [id number]. Topic: Quick Communication - Rx Refill/Question >> Apr 28, 2018  9:38 AM Laural Benes, [place] C wrote: Medication: acetaminophen-codeine (TYLENOL #3) 300-30 MG tablet   Has the patient contacted their pharmacy? No  (Agent: If no, request that the patient contact the pharmacy for the refill.) (Agent: If yes, when and what did the pharmacy advise?)  Preferred Pharmacy (with phone number or street name): [id number] [place], [place] - [address] AT [facility] [address] [phone] (Phone) [phone] (Fax)    Agent: Please be advised that RX refills may take up to 3 business days. We ask that you follow-up with your pharmacy.  [phone] Kathlene November - (son) would like a call when completed.

## 2018-04-28 NOTE — Telephone Encounter (Signed)
Please let him know that due to new state law (STOP ACT) if this is to become a chronic narcotic  medicine we will need to have face to face visits every 3 months  Also a urine drug screen at least once yearly  I'm glad it is helping however  Continue to be careful about the acetaminophen (glad he is paying attention)  If they decide to stick with this long term please schedule a first encounter for chronic pain management in about a month  In addition to that if they desire a neurologist appt to help with chronic headaches I can also refer  Thanks

## 2018-05-01 NOTE — Telephone Encounter (Signed)
Neurology Appt  Scheduled for tomorrow with Dr Lucia Gaskins.

## 2018-05-02 ENCOUNTER — Ambulatory Visit: Payer: Medicare Other | Admitting: Neurology

## 2018-05-02 ENCOUNTER — Encounter: Payer: Self-pay | Admitting: Neurology

## 2018-05-02 VITALS — BP 113/60 | HR 80 | Ht 62.0 in | Wt 116.0 lb

## 2018-05-02 DIAGNOSIS — Z96642 Presence of left artificial hip joint: Secondary | ICD-10-CM | POA: Diagnosis not present

## 2018-05-02 DIAGNOSIS — I10 Essential (primary) hypertension: Secondary | ICD-10-CM | POA: Diagnosis not present

## 2018-05-02 DIAGNOSIS — Z7982 Long term (current) use of aspirin: Secondary | ICD-10-CM | POA: Diagnosis not present

## 2018-05-02 DIAGNOSIS — M1990 Unspecified osteoarthritis, unspecified site: Secondary | ICD-10-CM | POA: Diagnosis not present

## 2018-05-02 DIAGNOSIS — W19XXXD Unspecified fall, subsequent encounter: Secondary | ICD-10-CM | POA: Diagnosis not present

## 2018-05-02 DIAGNOSIS — D62 Acute posthemorrhagic anemia: Secondary | ICD-10-CM | POA: Diagnosis not present

## 2018-05-02 DIAGNOSIS — G44309 Post-traumatic headache, unspecified, not intractable: Secondary | ICD-10-CM | POA: Diagnosis not present

## 2018-05-02 DIAGNOSIS — R519 Headache, unspecified: Secondary | ICD-10-CM

## 2018-05-02 DIAGNOSIS — Z792 Long term (current) use of antibiotics: Secondary | ICD-10-CM | POA: Diagnosis not present

## 2018-05-02 DIAGNOSIS — R51 Headache: Secondary | ICD-10-CM

## 2018-05-02 DIAGNOSIS — Z9181 History of falling: Secondary | ICD-10-CM | POA: Diagnosis not present

## 2018-05-02 DIAGNOSIS — M80052D Age-related osteoporosis with current pathological fracture, left femur, subsequent encounter for fracture with routine healing: Secondary | ICD-10-CM | POA: Diagnosis not present

## 2018-05-02 MED ORDER — GABAPENTIN 100 MG PO CAPS: 100.0000 mg | ORAL_CAPSULE | Freq: Three times a day (TID) | ORAL | 3 refills | Status: DC

## 2018-05-02 NOTE — Progress Notes (Signed)
[facility]    Provider:  Dr Jaynee Eagles Referring Provider: Abner Greenspan, MD Primary Care Provider:  Abner Greenspan, MD  CC:  Headache since a fall  HPI:  Chloe Cooper is a 85 y.o. female here as requested by provider Tower, Wynelle Fanny, MD for headache. PMHx HTN, osteoporosis, back pain, anemia, b12 deficiency, hematoma of left parietal scalp after a fall, chronic headache, hemiarthroplasty. She fell. A pain pill doesn't help. The whole head hurts. She also has dizziness. It just stays there all the time. She is a poor historian. Son says she takes tylenol codeine which helps. Better when she goes to bed and she sleeps well, she complains with it all day. Saturday night she was throwing up the headache was so bad and she went to the ED. She was screaming and hollering. No vision changes with the headaches, unknown triggers. Still sore where she fell and hit her head. Fall happened 2 months ago, right before christmas. She is a poor historian, son provides most information. She is sleepin well. Son provides most information, she is a poor historian. No other focal neurologic deficits, associated symptoms, inciting events or modifiable factors.  Reviewed notes, labs and imaging from outside physicians, which showed:  Review Dr. Alba Cory notes.  Patient appears to have chronic headaches which have been exacerbated after a fall and they have tried medication such as Tylenol, tramadol and others.  The family apparently seems to think the pain is "in her head ", patient says she is in pain every 30 minutes or so and asked for a pain pill.  They give her 1 of her iron tabs and she says that helps with the pain.  They said if she wants to complain about pain and is not time for her Tylenol they have been given her an 81 mg aspirin so between the Tylenol, and aspirin patient is stable.  Patient was seen at the emergency room for frontal headache, aching and throbbing in quality, reported she  has had this since her fall.  She has had 2 CT scans.  Reviewed CT of the head images 04/25/2018, nothing acute  Review of Systems: Patient complains of symptoms per HPI as well as the following symptoms:memory loss. Headache,hearing loss. Pertinent negatives and positives per HPI. All others negative.   Social History   Socioeconomic History  . Marital status: Widowed    Spouse name: Not on file  . Number of children: 3  . Years of education: Not on file  . Highest education level: 9th grade  Occupational History  . Occupation: Retired   Scientific laboratory technician  . Financial resource strain: Not on file  . Food insecurity:    Worry: Not on file    Inability: Not on file  . Transportation needs:    Medical: Not on file    Non-medical: Not on file  Tobacco Use  . Smoking status: Never Smoker  . Smokeless tobacco: Never Used  Substance and Sexual Activity  . Alcohol use: Never    Alcohol/week: 0.0 standard drinks    Frequency: Never  . Drug use: Never  . Sexual activity: Never    Birth control/protection: Surgical  Lifestyle  . Physical activity:    Days per week: Not on file    Minutes per session: Not on file  . Stress: Not on file  Relationships  . Social connections:    Talks on phone: Not on file    Gets together: Not on  file    Attends religious service: Not on file    Active member of club or organization: Not on file    Attends meetings of clubs or organizations: Not on file    Relationship status: Not on file  . Intimate partner violence:    Fear of current or ex partner: Not on file    Emotionally abused: Not on file    Physically abused: Not on file    Forced sexual activity: Not on file  Other Topics Concern  . Not on file  Social History Narrative   Lives at home with alone however has someone there 24/7. Sons alternate weeks spent with patient and there is additional caretaker that comes in 4 days per week for 4 hours.       Coffee and Pepsi daily: 1/2 cup  coffee & 1/3 cup pepsi    Family History  Problem Relation Age of Onset  . Heart disease Father        CAD  . Heart disease Brother        CAD  . Heart disease Brother        CAD  . Cancer Mother   . Diverticulitis Sister   . Migraines Neg Hx   . Headache Neg Hx     Past Medical History:  Diagnosis Date  . Arthritis    Osteoarthritis hands and knees/?RA  . Baker's cyst   . Cataract   . Degenerative disc disease, lumbar    some spinal stenosis  . Diverticulitis 2009  . Gallstones 2009  . GERD (gastroesophageal reflux disease)   . Hyperlipidemia   . Osteoporosis   . Recurrent UTI   . Shingles   . Wrist fracture, right 2008    Patient Active Problem List   Diagnosis Date Noted  . Fall at home, initial encounter 02/24/2018  . Hematoma of left parietal scalp 02/24/2018  . Prolonged QT interval 02/24/2018  . Femoral neck fracture ([place]) 02/23/2018  . B12 deficiency 12/24/2015  . Anemia 11/25/2015  . Burning sensation of mouth 04/22/2015  . Lumbar pain 10/30/2014  . Bilateral knee pain 10/30/2014  . Encounter for Medicare annual wellness exam 02/23/2013  . Headache 06/09/2012  . Recurrent UTI 05/25/2011  . Back pain, thoracic 11/11/2010  . Thoracic back pain 11/07/2010  . Constipation 09/30/2010  . Osteoarthritis 08/21/2010  . Vitamin D deficiency 10/08/2008  . ESSENTIAL HYPERTENSION, BENIGN 10/02/2008  . HYPERCHOLESTEROLEMIA 07/19/2006  . Osteoporosis 07/19/2006    Past Surgical History:  Procedure Laterality Date  . APPENDECTOMY    . BREAST SURGERY     cyst removed left breast  . cataracts surg     bil  . CHOLECYSTECTOMY  2012   . HIP ARTHROPLASTY Left 02/24/2018   Procedure: ARTHROPLASTY (HEMIARTHROPLASTY) non cement;  Surgeon: Dorna Leitz, MD;  Location: [facility];  Service: Orthopedics;  Laterality: Left;  . PARTIAL HYSTERECTOMY    . skin lesions removed     skin cancer  . TRIGGER FINGER RELEASE     bil hands  . tumor removed     removed from  lining of heart    Current Outpatient Medications  Medication Sig Dispense Refill  . acetaminophen-codeine (TYLENOL #3) 300-30 MG tablet Take 1 tablet by mouth every 6 (six) hours as needed for moderate pain or severe pain. 120 tablet 0  . Bismuth Subsalicylate (PEPTO-BISMOL PO) Take by mouth as needed.    . Cholecalciferol (VITAMIN D3 PO) Take 1 capsule by mouth daily.    Marland Kitchen  fluticasone (FLONASE) 50 MCG/ACT nasal spray Place 1 spray into both nostrils daily. (Patient taking differently: Place 1 spray into both nostrils daily as needed. ) 16 g 1  . Hypromellose (CVS GENTLE LUBRICANT EYE DROPS OP) Apply 2 drops to eye daily as needed (dry eyes).     . iron polysaccharides (FERREX 150) 150 MG capsule Take 1 capsule (150 mg total) by mouth 2 (two) times daily. 180 capsule 3  . ondansetron (ZOFRAN) 4 MG tablet Take 4 mg by mouth every 8 (eight) hours as needed for nausea or vomiting.    . polyethylene glycol (MIRALAX / GLYCOLAX) packet Take 17 g by mouth daily. 14 each 0  . ranitidine (ZANTAC) 75 MG tablet Take 75 mg by mouth daily as needed for heartburn.     Marland Kitchen UNABLE TO FIND Apply topically as needed (back pain). Med Name: hemp cream for pain    . acetaminophen (TYLENOL) 500 MG tablet Take 500 mg by mouth every 8 (eight) hours as needed for mild pain.     Marland Kitchen gabapentin (NEURONTIN) 100 MG capsule Take 1 capsule (100 mg total) by mouth 3 (three) times daily. 90 capsule 3   No current facility-administered medications for this visit.     Allergies as of 05/02/2018 - Review Complete 05/02/2018  Allergen Reaction Noted  . Alendronate sodium Swelling 07/19/2006  . Penicillins  07/19/2006  . Simvastatin  02/23/2013  . Sulfonamide derivatives  07/19/2006  . Tramadol Other (See Comments) 04/14/2018    Vitals: BP 113/60 (BP Location: Right Arm, Patient Position: Sitting)   Pulse 80   Ht _0  (1.575 m)   Wt 116 lb (52.6 kg)   BMI 21.22 kg/m  Last Weight:  Wt Readings from Last 1 Encounters:    05/02/18 116 lb (52.6 kg)   Last Height:   Ht Readings from Last 1 Encounters:  05/02/18 _1  (1.575 m)     Physical exam: Exam: Gen: NAD                CV: RRR, no MRG. No Carotid Bruits. No peripheral edema, warm, nontender Eyes: Conjunctivae clear without exudates or hemorrhage  Neuro: Detailed Neurologic Exam  Speech:    Speech is normal; fluent and spontaneous with normal comprehension.  Cognition:    The patient is oriented to person, not month, knows the year    recent and remote memory impaired;     language fluent;     Impaired attention, concentration,  fund of knowledge Cranial Nerves:    The pupils are pinpoint and reactive to light. Attempted fundoscopy could not visualize due to small pupils. Visual fields are full to finger confrontation. Extraocular movements are intact. Trigeminal sensation is intact and the muscles of mastication are normal. The face is symmetric. The palate elevates in the midline. Hearing impaired . Voice is normal. Shoulder shrug is normal. The tongue has normal motion without fasciculations.   Coordination:    Normal finger to nose  Gait:    Walks with a walker or cane, stopped, bradykinesia  Motor Observation:    no involuntary movements noted. Tone:    Normal muscle tone.       Strength:    Strength is antigravity and equal      Sensation: intact to LT     Reflex Exam:  DTR's:    Deep tendon reflexes in the upper and lower extremities are symmetric bilaterally.   Toes:    The toes are equiv bilaterally.  Clonus:    Clonus is absent.    Assessment/Plan:   85 y.o. female here as requested by provider Tower, Wynelle Fanny, MD for headache. PMHx HTN, osteoporosis, back pain, anemia, b12 deficiency, hematoma of left parietal scalp after a fall, chronic headache, hemiarthroplasty.She has a headache since having a fall 2 months ago. She is a poor historian, son here. Likely post-concussive headache. Reassured them. No suspicion for  GCA but can get esr and crp.  Start very low dose gabapentin. In 2014 she had similar episode after a fall with headache, she was given a TCA at that time with improvement however hesitate to give a 85 year old a TCA now. Will try very low dose gabapentin.    Meds ordered this encounter  Medications  . gabapentin (NEURONTIN) 100 MG capsule    Sig: Take 1 capsule (100 mg total) by mouth 3 (three) times daily.    Dispense:  90 capsule    Refill:  3   Discussed: To prevent or relieve headaches, try the following: Cool Compress. Lie down and place a cool compress on your head.  Avoid headache triggers. If certain foods or odors seem to have triggered your migraines in the past, avoid them. A headache diary might help you identify triggers.  Include physical activity in your daily routine. Try a daily walk or other moderate aerobic exercise.  Manage stress. Find healthy ways to cope with the stressors, such as delegating tasks on your to-do list.  Practice relaxation techniques. Try deep breathing, yoga, massage and visualization.  Eat regularly. Eating regularly scheduled meals and maintaining a healthy diet might help prevent headaches. Also, drink plenty of fluids.  Follow a regular sleep schedule. Sleep deprivation might contribute to headaches Consider biofeedback. With this mind-body technique, you learn to control certain bodily functions - such as muscle tension, heart rate and blood pressure - to prevent headaches or reduce headache pain.    Proceed to emergency room if you experience new or worsening symptoms or symptoms do not resolve, if you have new neurologic symptoms or if headache is severe, or for any concerning symptom.   Provided education and documentation from American headache Society toolbox including articles on: chronic migraine medication overuse headache, chronic migraines, prevention of migraines, behavioral and other nonpharmacologic treatments for headache.  Cc:  Tower, Wynelle Fanny, MD,    Sarina Ill, MD  [facility] [address] [place], [place] [address]  Phone [phone] Fax [phone]

## 2018-05-02 NOTE — Patient Instructions (Signed)
Gabapentin 730am, noon, 5pm May give an extra dose if needed   Gabapentin capsules or tablets What is this medicine? GABAPENTIN (GA ba pen tin) is used to control seizures in certain types of epilepsy. It is also used to treat certain types of nerve pain. This medicine may be used for other purposes; ask your health care provider or pharmacist if you have questions. COMMON BRAND NAME(S): Active-PAC with Gabapentin, Gabarone, Neurontin What should I tell my health care provider before I take this medicine? They need to know if you have any of these conditions: -kidney disease -suicidal thoughts, plans, or attempt; a previous suicide attempt by you or a family member -an unusual or allergic reaction to gabapentin, other medicines, foods, dyes, or preservatives -pregnant or trying to get pregnant -breast-feeding How should I use this medicine? Take this medicine by mouth with a glass of water. Follow the directions on the prescription label. You can take it with or without food. If it upsets your stomach, take it with food. Take your medicine at regular intervals. Do not take it more often than directed. Do not stop taking except on your doctor's advice. If you are directed to break the 600 or 800 mg tablets in half as part of your dose, the extra half tablet should be used for the next dose. If you have not used the extra half tablet within 28 days, it should be thrown away. A special MedGuide will be given to you by the pharmacist with each prescription and refill. Be sure to read this information carefully each time. Talk to your pediatrician regarding the use of this medicine in children. While this drug may be prescribed for children as young as 3 years for selected conditions, precautions do apply. Overdosage: If you think you have taken too much of this medicine contact a poison control center or emergency room at once. NOTE: This medicine is only for you. Do not share this medicine with  others. What if I miss a dose? If you miss a dose, take it as soon as you can. If it is almost time for your next dose, take only that dose. Do not take double or extra doses. What may interact with this medicine? Do not take this medicine with any of the following medications: -other gabapentin products This medicine may also interact with the following medications: -alcohol -antacids -antihistamines for allergy, cough and cold -certain medicines for anxiety or sleep -certain medicines for depression or psychotic disturbances -homatropine; hydrocodone -naproxen -narcotic medicines (opiates) for pain -phenothiazines like chlorpromazine, mesoridazine, prochlorperazine, thioridazine This list may not describe all possible interactions. Give your health care provider a list of all the medicines, herbs, non-prescription drugs, or dietary supplements you use. Also tell them if you smoke, drink alcohol, or use illegal drugs. Some items may interact with your medicine. What should I watch for while using this medicine? Visit your doctor or health care professional for regular checks on your progress. You may want to keep a record at home of how you feel your condition is responding to treatment. You may want to share this information with your doctor or health care professional at each visit. You should contact your doctor or health care professional if your seizures get worse or if you have any new types of seizures. Do not stop taking this medicine or any of your seizure medicines unless instructed by your doctor or health care professional. Stopping your medicine suddenly can increase your seizures or their severity. Wear  a medical identification bracelet or chain if you are taking this medicine for seizures, and carry a card that lists all your medications. You may get drowsy, dizzy, or have blurred vision. Do not drive, use machinery, or do anything that needs mental alertness until you know how  this medicine affects you. To reduce dizzy or fainting spells, do not sit or stand up quickly, especially if you are an older patient. Alcohol can increase drowsiness and dizziness. Avoid alcoholic drinks. Your mouth may get dry. Chewing sugarless gum or sucking hard candy, and drinking plenty of water will help. The use of this medicine may increase the chance of suicidal thoughts or actions. Pay special attention to how you are responding while on this medicine. Any worsening of mood, or thoughts of suicide or dying should be reported to your health care professional right away. Women who become pregnant while using this medicine may enroll in the [place] American Antiepileptic Drug Pregnancy Registry by calling ([phone]. This registry collects information about the safety of antiepileptic drug use during pregnancy. What side effects may I notice from receiving this medicine? Side effects that you should report to your doctor or health care professional as soon as possible: -allergic reactions like skin rash, itching or hives, swelling of the face, lips, or tongue -worsening of mood, thoughts or actions of suicide or dying Side effects that usually do not require medical attention (report to your doctor or health care professional if they continue or are bothersome): -constipation -difficulty walking or controlling muscle movements -dizziness -nausea -slurred speech -tiredness -tremors -weight gain This list may not describe all possible side effects. Call your doctor for medical advice about side effects. You may report side effects to FDA at 1-800-FDA-1088. Where should I keep my medicine? Keep out of reach of children. This medicine may cause accidental overdose and death if it taken by other adults, children, or pets. Mix any unused medicine with a substance like cat litter or coffee grounds. Then throw the medicine away in a sealed container like a sealed bag or a coffee can with a  lid. Do not use the medicine after the expiration date. Store at room temperature between 15 and 30 degrees C (59 and 86 degrees F). NOTE: This sheet is a summary. It may not cover all possible information. If you have questions about this medicine, talk to your doctor, pharmacist, or health care provider.  2019 Elsevier/Gold Standard (2017-07-28 13:21:44)

## 2018-05-03 DIAGNOSIS — I10 Essential (primary) hypertension: Secondary | ICD-10-CM | POA: Diagnosis not present

## 2018-05-03 DIAGNOSIS — Z9181 History of falling: Secondary | ICD-10-CM | POA: Diagnosis not present

## 2018-05-03 DIAGNOSIS — D62 Acute posthemorrhagic anemia: Secondary | ICD-10-CM | POA: Diagnosis not present

## 2018-05-03 DIAGNOSIS — Z96642 Presence of left artificial hip joint: Secondary | ICD-10-CM | POA: Diagnosis not present

## 2018-05-03 DIAGNOSIS — Z7982 Long term (current) use of aspirin: Secondary | ICD-10-CM | POA: Diagnosis not present

## 2018-05-03 DIAGNOSIS — M80052D Age-related osteoporosis with current pathological fracture, left femur, subsequent encounter for fracture with routine healing: Secondary | ICD-10-CM | POA: Diagnosis not present

## 2018-05-03 DIAGNOSIS — M1990 Unspecified osteoarthritis, unspecified site: Secondary | ICD-10-CM | POA: Diagnosis not present

## 2018-05-03 DIAGNOSIS — W19XXXD Unspecified fall, subsequent encounter: Secondary | ICD-10-CM | POA: Diagnosis not present

## 2018-05-03 DIAGNOSIS — Z792 Long term (current) use of antibiotics: Secondary | ICD-10-CM | POA: Diagnosis not present

## 2018-05-04 DIAGNOSIS — D62 Acute posthemorrhagic anemia: Secondary | ICD-10-CM | POA: Diagnosis not present

## 2018-05-04 DIAGNOSIS — M1990 Unspecified osteoarthritis, unspecified site: Secondary | ICD-10-CM | POA: Diagnosis not present

## 2018-05-04 DIAGNOSIS — Z96642 Presence of left artificial hip joint: Secondary | ICD-10-CM | POA: Diagnosis not present

## 2018-05-04 DIAGNOSIS — Z9181 History of falling: Secondary | ICD-10-CM | POA: Diagnosis not present

## 2018-05-04 DIAGNOSIS — Z792 Long term (current) use of antibiotics: Secondary | ICD-10-CM | POA: Diagnosis not present

## 2018-05-04 DIAGNOSIS — W19XXXD Unspecified fall, subsequent encounter: Secondary | ICD-10-CM | POA: Diagnosis not present

## 2018-05-04 DIAGNOSIS — I10 Essential (primary) hypertension: Secondary | ICD-10-CM | POA: Diagnosis not present

## 2018-05-04 DIAGNOSIS — Z7982 Long term (current) use of aspirin: Secondary | ICD-10-CM | POA: Diagnosis not present

## 2018-05-04 DIAGNOSIS — M80052D Age-related osteoporosis with current pathological fracture, left femur, subsequent encounter for fracture with routine healing: Secondary | ICD-10-CM | POA: Diagnosis not present

## 2018-05-05 DIAGNOSIS — Z96642 Presence of left artificial hip joint: Secondary | ICD-10-CM | POA: Diagnosis not present

## 2018-05-05 DIAGNOSIS — M1990 Unspecified osteoarthritis, unspecified site: Secondary | ICD-10-CM | POA: Diagnosis not present

## 2018-05-05 DIAGNOSIS — D62 Acute posthemorrhagic anemia: Secondary | ICD-10-CM | POA: Diagnosis not present

## 2018-05-05 DIAGNOSIS — M80052D Age-related osteoporosis with current pathological fracture, left femur, subsequent encounter for fracture with routine healing: Secondary | ICD-10-CM | POA: Diagnosis not present

## 2018-05-05 DIAGNOSIS — W19XXXD Unspecified fall, subsequent encounter: Secondary | ICD-10-CM | POA: Diagnosis not present

## 2018-05-05 DIAGNOSIS — I10 Essential (primary) hypertension: Secondary | ICD-10-CM | POA: Diagnosis not present

## 2018-05-05 DIAGNOSIS — Z792 Long term (current) use of antibiotics: Secondary | ICD-10-CM | POA: Diagnosis not present

## 2018-05-05 DIAGNOSIS — Z9181 History of falling: Secondary | ICD-10-CM | POA: Diagnosis not present

## 2018-05-05 DIAGNOSIS — Z7982 Long term (current) use of aspirin: Secondary | ICD-10-CM | POA: Diagnosis not present

## 2018-05-09 DIAGNOSIS — Z96642 Presence of left artificial hip joint: Secondary | ICD-10-CM | POA: Diagnosis not present

## 2018-05-09 DIAGNOSIS — D62 Acute posthemorrhagic anemia: Secondary | ICD-10-CM | POA: Diagnosis not present

## 2018-05-09 DIAGNOSIS — W19XXXD Unspecified fall, subsequent encounter: Secondary | ICD-10-CM | POA: Diagnosis not present

## 2018-05-09 DIAGNOSIS — I10 Essential (primary) hypertension: Secondary | ICD-10-CM | POA: Diagnosis not present

## 2018-05-09 DIAGNOSIS — M80052D Age-related osteoporosis with current pathological fracture, left femur, subsequent encounter for fracture with routine healing: Secondary | ICD-10-CM | POA: Diagnosis not present

## 2018-05-09 DIAGNOSIS — Z7982 Long term (current) use of aspirin: Secondary | ICD-10-CM | POA: Diagnosis not present

## 2018-05-09 DIAGNOSIS — Z792 Long term (current) use of antibiotics: Secondary | ICD-10-CM | POA: Diagnosis not present

## 2018-05-09 DIAGNOSIS — Z9181 History of falling: Secondary | ICD-10-CM | POA: Diagnosis not present

## 2018-05-09 DIAGNOSIS — M1990 Unspecified osteoarthritis, unspecified site: Secondary | ICD-10-CM | POA: Diagnosis not present

## 2018-05-10 DIAGNOSIS — Z96642 Presence of left artificial hip joint: Secondary | ICD-10-CM | POA: Diagnosis not present

## 2018-05-10 DIAGNOSIS — W19XXXD Unspecified fall, subsequent encounter: Secondary | ICD-10-CM | POA: Diagnosis not present

## 2018-05-10 DIAGNOSIS — Z7982 Long term (current) use of aspirin: Secondary | ICD-10-CM | POA: Diagnosis not present

## 2018-05-10 DIAGNOSIS — I10 Essential (primary) hypertension: Secondary | ICD-10-CM | POA: Diagnosis not present

## 2018-05-10 DIAGNOSIS — Z792 Long term (current) use of antibiotics: Secondary | ICD-10-CM | POA: Diagnosis not present

## 2018-05-10 DIAGNOSIS — Z9181 History of falling: Secondary | ICD-10-CM | POA: Diagnosis not present

## 2018-05-10 DIAGNOSIS — M1990 Unspecified osteoarthritis, unspecified site: Secondary | ICD-10-CM | POA: Diagnosis not present

## 2018-05-10 DIAGNOSIS — D62 Acute posthemorrhagic anemia: Secondary | ICD-10-CM | POA: Diagnosis not present

## 2018-05-10 DIAGNOSIS — M80052D Age-related osteoporosis with current pathological fracture, left femur, subsequent encounter for fracture with routine healing: Secondary | ICD-10-CM | POA: Diagnosis not present

## 2018-05-11 DIAGNOSIS — Z96642 Presence of left artificial hip joint: Secondary | ICD-10-CM | POA: Diagnosis not present

## 2018-05-11 DIAGNOSIS — Z9181 History of falling: Secondary | ICD-10-CM | POA: Diagnosis not present

## 2018-05-11 DIAGNOSIS — D62 Acute posthemorrhagic anemia: Secondary | ICD-10-CM | POA: Diagnosis not present

## 2018-05-11 DIAGNOSIS — Z792 Long term (current) use of antibiotics: Secondary | ICD-10-CM | POA: Diagnosis not present

## 2018-05-11 DIAGNOSIS — M80052D Age-related osteoporosis with current pathological fracture, left femur, subsequent encounter for fracture with routine healing: Secondary | ICD-10-CM | POA: Diagnosis not present

## 2018-05-11 DIAGNOSIS — Z7982 Long term (current) use of aspirin: Secondary | ICD-10-CM | POA: Diagnosis not present

## 2018-05-11 DIAGNOSIS — M1990 Unspecified osteoarthritis, unspecified site: Secondary | ICD-10-CM | POA: Diagnosis not present

## 2018-05-11 DIAGNOSIS — I10 Essential (primary) hypertension: Secondary | ICD-10-CM | POA: Diagnosis not present

## 2018-05-11 DIAGNOSIS — W19XXXD Unspecified fall, subsequent encounter: Secondary | ICD-10-CM | POA: Diagnosis not present

## 2018-09-11 ENCOUNTER — Telehealth: Payer: Self-pay | Admitting: Family Medicine

## 2018-09-11 NOTE — Telephone Encounter (Signed)
Patient's son, Ronalee Belts, called.  Ronalee Belts said patient wants to request a DNR form.

## 2018-09-11 NOTE — Telephone Encounter (Signed)
Form mailed per son's request

## 2018-09-11 NOTE — Telephone Encounter (Signed)
I put the orange DNR form in IN box They need to display it on refrigerator  Also remind him to update her advance directive (poa/living will) if needed

## 2018-09-28 ENCOUNTER — Telehealth: Payer: Self-pay

## 2018-09-28 NOTE — Telephone Encounter (Signed)
Go ahead and get a B12 shot - I think she was getting them every 4-6 weeks in the past

## 2018-09-28 NOTE — Telephone Encounter (Signed)
Pt's son called to say pt has been very sluggish the last few weeks and thinks it may be due to not having a B12 injection in several months due to Covid. He is asking should she have blood work to check the B12 or just go ahead and restart the injection.

## 2018-09-28 NOTE — Telephone Encounter (Signed)
Appointment scheduled on 10/04/18.

## 2018-10-04 ENCOUNTER — Ambulatory Visit (INDEPENDENT_AMBULATORY_CARE_PROVIDER_SITE_OTHER): Payer: Medicare Other

## 2018-10-04 DIAGNOSIS — E538 Deficiency of other specified B group vitamins: Secondary | ICD-10-CM | POA: Diagnosis not present

## 2018-10-04 MED ORDER — CYANOCOBALAMIN 1000 MCG/ML IJ SOLN
1000.0000 ug | Freq: Once | INTRAMUSCULAR | Status: AC
  Administered 2018-10-04: 1000 ug via INTRAMUSCULAR

## 2018-10-04 NOTE — Progress Notes (Signed)
Per orders of Dr. Glori Bickers, injection of B12 given by Kris Mouton. Patient tolerated injection well.  Patient was instructed to schedule monthly B12 injection appointment for August.

## 2018-10-11 ENCOUNTER — Other Ambulatory Visit: Payer: Self-pay | Admitting: Neurology

## 2018-11-08 ENCOUNTER — Telehealth: Payer: Self-pay | Admitting: *Deleted

## 2018-11-08 NOTE — Telephone Encounter (Signed)
Colletta [place] nurse with [facility] left a voicemail stating that they have been consulted and wants to know if Dr. Glori Bickers will sign a certificate of terminal illness so that she can get care at home? Please call and confirm.

## 2018-11-08 NOTE — Telephone Encounter (Signed)
That sounds good- I will watch out for the form Thanks

## 2018-11-08 NOTE — Telephone Encounter (Signed)
How is she doing? Any new medical problems or issues I am unaware of?  What kind of care can they provide?  I can certify very advanced age as her terminal condition (also failure to thrive if she fits that criteria) That sounds fine

## 2018-11-08 NOTE — Telephone Encounter (Signed)
Chloe Cooper is wanting pt to be eval by hospice. She said she is becoming very weak and unable to walk. Pt tried to stand today and almost fell due to weakness, pt's son caught her. Pt is only getting a bath once a week due to fear of falling. The family sees a major decline in function, pt is sleeping all day and complains about back, fee, and knee pain. They are giving her tylenol for the pain. Pt is praying that "the lord takes her" every night due to how she is feeling. Chloe Cooper will fax over form for Dr. Marliss Coots review. They said they need that form for them to do a hospice eval

## 2018-11-09 ENCOUNTER — Ambulatory Visit (INDEPENDENT_AMBULATORY_CARE_PROVIDER_SITE_OTHER): Payer: Medicare Other

## 2018-11-09 DIAGNOSIS — E538 Deficiency of other specified B group vitamins: Secondary | ICD-10-CM

## 2018-11-09 MED ORDER — CYANOCOBALAMIN 1000 MCG/ML IJ SOLN
1000.0000 ug | Freq: Once | INTRAMUSCULAR | Status: AC
  Administered 2018-11-09: 1000 ug via INTRAMUSCULAR

## 2018-11-09 NOTE — Progress Notes (Signed)
Per orders of Dr. Tower, injection of vit B12 given by Mariselda Badalamenti. Patient tolerated injection well.  

## 2018-11-15 ENCOUNTER — Telehealth: Payer: Self-pay

## 2018-11-15 NOTE — Telephone Encounter (Signed)
Jenn with [facility] left v/m requesting cb if Dr Glori Bickers would be the attending on record for hospice.

## 2018-11-15 NOTE — Telephone Encounter (Signed)
Yes, thanks

## 2018-11-15 NOTE — Telephone Encounter (Signed)
Left VM letting Jenn know Dr. Marliss Coots response

## 2018-11-16 ENCOUNTER — Ambulatory Visit: Payer: Medicare Other

## 2018-11-16 ENCOUNTER — Telehealth: Payer: Self-pay | Admitting: Family Medicine

## 2018-11-16 NOTE — Telephone Encounter (Signed)
Palliative care would be good  I will wait to hear unless they need [place] to make a call Thanks

## 2018-11-16 NOTE — Telephone Encounter (Signed)
Chloe Cooper with [facility] called today in regards to the hospice referral for the patient She stated that they met with her today to see if she was eligible but the patient did not meet the criteria. And she was not admitted to hospice care. Chloe Cooper did state that they would be getting the patient in with the palliative services. Dr Lucita Ferrara tried to call today but you were out of office, he stated he is going to try again tomorrow to reach out so he can further discuss this with you

## 2018-11-17 ENCOUNTER — Telehealth: Payer: Self-pay

## 2018-11-17 DIAGNOSIS — S31000S Unspecified open wound of lower back and pelvis without penetration into retroperitoneum, sequela: Secondary | ICD-10-CM

## 2018-11-17 DIAGNOSIS — R531 Weakness: Secondary | ICD-10-CM

## 2018-11-17 NOTE — Telephone Encounter (Signed)
No contact info left in message will await Dr. Judson Roch return call

## 2018-11-17 NOTE — Telephone Encounter (Signed)
Colletta Maryland RN with [facility] left v/m; pt did not meet criteria of terminal illness diagnosis so pt was not accepted into hospice. Colletta Maryland said pt has an open wound on backside and wants to know if can get verbal orders for wound care and OT for strengthening.Please advise.

## 2018-11-17 NOTE — Telephone Encounter (Signed)
Please give those verbal orders

## 2018-11-17 NOTE — Telephone Encounter (Signed)
Verbal order given to Colletta Maryland, RN she said that her office doesn't do wound care or OT, she was just recommending this for pt, she said we need to put order in so our [facility] can get that set up

## 2018-11-19 ENCOUNTER — Encounter: Payer: Self-pay | Admitting: Family Medicine

## 2018-11-19 DIAGNOSIS — R531 Weakness: Secondary | ICD-10-CM | POA: Insufficient documentation

## 2018-11-19 DIAGNOSIS — S31000A Unspecified open wound of lower back and pelvis without penetration into retroperitoneum, initial encounter: Secondary | ICD-10-CM | POA: Insufficient documentation

## 2018-11-19 NOTE — Telephone Encounter (Signed)
I have not seen the office for a face to face visit since February -I think  I cannot put in order for home health (nsg for sacral wound and OT for weakness) as requested because I have not face to face visit date to enter into the template I'm not sure if she is able to come in to the office due to her disability   I will route this to [place] and [place]

## 2018-11-20 ENCOUNTER — Telehealth: Payer: Self-pay | Admitting: Hospice

## 2018-11-20 NOTE — Telephone Encounter (Signed)
Spoke with patient's son Lurine Imel regarding Palliative services and he was in agreement with this.  I have scheduled a Telephone Consult for 11/29/18 @ 9 AM

## 2018-11-20 NOTE — Telephone Encounter (Signed)
Spoke to the patients son and he will bring Chloe Cooper in for a face to face visit next Monday. Please place the Wound Care Referral so we can start working on getting an agency involved.He didn't think she really needed the OT therapy cause he said she is 85 years old and might not want to do it. Im working on who to call for the [facility] who can accept her.

## 2018-11-20 NOTE — Telephone Encounter (Signed)
I spoke with Dr Lyman Speller and he is putting in the palliative order Thanks

## 2018-11-21 NOTE — Telephone Encounter (Signed)
Faxed Referral over to [facility] to see if they will accept, waiting to hear back. [facility] declined.

## 2018-11-24 NOTE — Telephone Encounter (Signed)
Thanks for trying  I will see her then

## 2018-11-24 NOTE — Telephone Encounter (Signed)
Called several HH agencys and noone would accept her right now. Spoke with patients son and told him we could always get her in to outpatient Wound if the wound is bad enough. Patient will see Dr Glori Bickers on Monday 11/27/18

## 2018-11-27 ENCOUNTER — Encounter: Payer: Self-pay | Admitting: Family Medicine

## 2018-11-27 ENCOUNTER — Other Ambulatory Visit: Payer: Self-pay

## 2018-11-27 ENCOUNTER — Ambulatory Visit (INDEPENDENT_AMBULATORY_CARE_PROVIDER_SITE_OTHER): Payer: Medicare Other | Admitting: Family Medicine

## 2018-11-27 VITALS — BP 102/60 | HR 68 | Temp 97.6°F | Ht 65.0 in | Wt 119.0 lb

## 2018-11-27 DIAGNOSIS — R531 Weakness: Secondary | ICD-10-CM | POA: Diagnosis not present

## 2018-11-27 DIAGNOSIS — R350 Frequency of micturition: Secondary | ICD-10-CM | POA: Insufficient documentation

## 2018-11-27 DIAGNOSIS — L89156 Pressure-induced deep tissue damage of sacral region: Secondary | ICD-10-CM | POA: Diagnosis not present

## 2018-11-27 DIAGNOSIS — G44309 Post-traumatic headache, unspecified, not intractable: Secondary | ICD-10-CM

## 2018-11-27 LAB — POC URINALSYSI DIPSTICK (AUTOMATED)
Bilirubin, UA: NEGATIVE
Blood, UA: NEGATIVE
Glucose, UA: NEGATIVE
Ketones, UA: POSITIVE
Nitrite, UA: POSITIVE
Protein, UA: NEGATIVE
Spec Grav, UA: 1.025 (ref 1.010–1.025)
Urobilinogen, UA: 0.2 E.U./dL
pH, UA: 6 (ref 5.0–8.0)

## 2018-11-27 NOTE — Patient Instructions (Addendum)
The pre ulcerative area on sacrum looks ok -there are no open areas (just scab) Use soap and water to keep it clean  The barrier cream (diapar rash product) is fine to use  Keep changing positions while sitting  An egg crate pad to sit on may help   Watch this closely and keep me posted   I will try to contact your neurologist since your headache is worse  [phone] is the contact number I understand   Let's get a urine sample on the way out  Keep drinking fluids    Talk with the palliative care team on Wednesday

## 2018-11-27 NOTE — Progress Notes (Signed)
Subjective:    Patient ID: Chloe Cooper, female    DOB: March 23, 1916, 85 y.o.   MRN: [id number]  HPI Pt presents with wound and general decline with advanced age  Sacral wound noted from RN from [facility] who was eval her for hospice services  Has a phone meeting wed at 9 am   Wt Readings from Last 3 Encounters:  11/27/18 119 lb (54 kg)  05/02/18 116 lb (52.6 kg)  04/25/18 111 lb (50.3 kg)   19.80 kg/m   We did ref for hospice care-she did not qualify based on medical problems  That agency was going to investigate palliative care (I spoke with Chloe Chloe Cooper)  She continues to get B12 shots for B12 def   She has h/o chronic migraines Worsened after a head injury in December and she did see neurology (Chloe Chloe Cooper) She started pt on very low dose gabapentin  Last CT showed atrophy 2/20  Pt states she takes it tid and it does not help  Complained more in 2-3 months-hurting worse and worse   Also chronic back pain and knee pain  Uses a cream   Sacral wound- right in the middle  Scabbed but red  She uses diaper rash ointment on it  A donut pillow actually caused worse problems (pressed on wrong side)   Hurts to sit -hard to get comfortable  Is ok to lie down  She has pillow to sit on   More frequency of urination lately  Unsure if she may not have uti  No pain to urinate  No blood in urine   ua today: Results for orders placed or performed in visit on 11/27/18  POCT Urinalysis Dipstick (Automated)  Result Value Ref Range   Color, UA yellow    Clarity, UA hazy    Glucose, UA Negative Negative   Bilirubin, UA negative    Ketones, UA positive    Spec Grav, UA 1.025 1.010 - 1.025   Blood, UA negative    pH, UA 6.0 5.0 - 8.0   Protein, UA Negative Negative   Urobilinogen, UA 0.2 0.2 or 1.0 E.U./dL   Nitrite, UA positive    Leukocytes, UA Moderate (2+) (A) Negative     She declines flu shots (unsure why)   Patient Active Problem List   Diagnosis Date Noted   . Pressure injury of deep tissue of sacral region 11/27/2018  . Urine frequency 11/27/2018  . Sacral wound 11/19/2018  . Generalized weakness 11/19/2018  . Post-concussion headache 05/02/2018  . Fall at home, initial encounter 02/24/2018  . Hematoma of left parietal scalp 02/24/2018  . Prolonged QT interval 02/24/2018  . Femoral neck fracture ([place]) 02/23/2018  . B12 deficiency 12/24/2015  . Anemia 11/25/2015  . Burning sensation of mouth 04/22/2015  . Lumbar pain 10/30/2014  . Bilateral knee pain 10/30/2014  . Encounter for Medicare annual wellness exam 02/23/2013  . Headache 06/09/2012  . Recurrent UTI 05/25/2011  . Back pain, thoracic 11/11/2010  . Thoracic back pain 11/07/2010  . Constipation 09/30/2010  . Osteoarthritis 08/21/2010  . Vitamin D deficiency 10/08/2008  . ESSENTIAL HYPERTENSION, BENIGN 10/02/2008  . HYPERCHOLESTEROLEMIA 07/19/2006  . Osteoporosis 07/19/2006   Past Medical History:  Diagnosis Date  . Arthritis    Osteoarthritis hands and knees/?RA  . Baker's cyst   . Cataract   . Degenerative disc disease, lumbar    some spinal stenosis  . Diverticulitis 2009  . Gallstones 2009  . GERD (gastroesophageal  reflux disease)   . Hyperlipidemia   . Osteoporosis   . Recurrent UTI   . Shingles   . Wrist fracture, right 2008   Past Surgical History:  Procedure Laterality Date  . APPENDECTOMY    . BREAST SURGERY     cyst removed left breast  . cataracts surg     bil  . CHOLECYSTECTOMY  2012   . HIP ARTHROPLASTY Left 02/24/2018   Procedure: ARTHROPLASTY (HEMIARTHROPLASTY) non cement;  Surgeon: Jodi GeraldsGraves, John, MD;  Location: [facility] OR;  Service: Orthopedics;  Laterality: Left;  . PARTIAL HYSTERECTOMY    . skin lesions removed     skin cancer  . TRIGGER FINGER RELEASE     bil hands  . tumor removed     removed from lining of heart   Social History   Tobacco Use  . Smoking status: Never Smoker  . Smokeless tobacco: Never Used  Substance Use Topics  .  Alcohol use: Never    Alcohol/week: 0.0 standard drinks    Frequency: Never  . Drug use: Never   Family History  Problem Relation Age of Onset  . Heart disease Father        CAD  . Heart disease Brother        CAD  . Heart disease Brother        CAD  . Cancer Mother   . Diverticulitis Sister   . Migraines Neg Hx   . Headache Neg Hx    Allergies  Allergen Reactions  . Alendronate Sodium Swelling    REACTION: GI  . Penicillins     REACTION: rash  . Simvastatin     Leg pain and cramping   . Sulfonamide Derivatives     REACTION: rash  . Tramadol Other (See Comments)    Confusion and mood change   Current Outpatient Medications on File Prior to Visit  Medication Sig Dispense Refill  . acetaminophen (TYLENOL) 500 MG tablet Take 500 mg by mouth every 8 (eight) hours as needed for mild pain.     . Bismuth Subsalicylate (PEPTO-BISMOL PO) Take by mouth as needed.    . Cholecalciferol (VITAMIN D3 PO) Take 1 capsule by mouth daily.    . fluticasone (FLONASE) 50 MCG/ACT nasal spray Place 1 spray into both nostrils daily. (Patient taking differently: Place 1 spray into both nostrils daily as needed. ) 16 g 1  . gabapentin (NEURONTIN) 100 MG capsule TAKE 1 CAPSULE BY MOUTH THREE TIMES A DAY 90 capsule 3  . Hypromellose ([employer] GENTLE LUBRICANT EYE DROPS OP) Apply 2 drops to eye daily as needed (dry eyes).     . iron polysaccharides (FERREX 150) 150 MG capsule Take 1 capsule (150 mg total) by mouth 2 (two) times daily. 180 capsule 3  . polyethylene glycol (MIRALAX / GLYCOLAX) packet Take 17 g by mouth daily. 14 each 0  . ranitidine (ZANTAC) 75 MG tablet Take 75 mg by mouth daily as needed for heartburn.     Marland Kitchen. UNABLE TO FIND Apply topically as needed (back pain). Med Name: hemp cream for pain     No current facility-administered medications on file prior to visit.       Review of Systems  Constitutional: Positive for fatigue. Negative for activity change, appetite change, fever and  unexpected weight change.       Sleeping more  HENT: Negative for congestion, ear pain, rhinorrhea, sinus pressure and sore throat.   Eyes: Negative for pain, redness and visual disturbance.  Respiratory: Negative for cough, shortness of breath and wheezing.   Cardiovascular: Negative for chest pain, palpitations and leg swelling.  Gastrointestinal: Negative for abdominal pain, blood in stool, constipation and diarrhea.  Endocrine: Negative for polydipsia and polyuria.  Genitourinary: Positive for frequency and urgency. Negative for dysuria, hematuria and pelvic pain.  Musculoskeletal: Negative for arthralgias, back pain and myalgias.  Skin: Negative for pallor and rash.  Allergic/Immunologic: Negative for environmental allergies.  Neurological: Positive for headaches. Negative for dizziness, syncope, facial asymmetry, light-headedness and numbness.  Hematological: Negative for adenopathy. Does not bruise/bleed easily.  Psychiatric/Behavioral: Negative for decreased concentration and dysphoric mood. The patient is not nervous/anxious.        Slowed cognition        Objective:   Physical Exam Constitutional:      General: She is not in acute distress.    Appearance: Normal appearance. She is normal weight. She is not ill-appearing.     Comments: Frail appearing elderly female in wheelchair  HENT:     Head: Normocephalic and atraumatic.     Comments: No sinus or temporal tenderness    Nose: No rhinorrhea.     Mouth/Throat:     Mouth: Mucous membranes are moist.     Pharynx: Oropharynx is clear.  Eyes:     General: No scleral icterus.       Right eye: No discharge.        Left eye: No discharge.     Extraocular Movements: Extraocular movements intact.     Conjunctiva/sclera: Conjunctivae normal.     Pupils: Pupils are equal, round, and reactive to light.  Neck:     Musculoskeletal: Normal range of motion and neck supple. No neck rigidity or muscular tenderness.     Vascular: No  carotid bruit.  Cardiovascular:     Rate and Rhythm: Normal rate and regular rhythm.  Pulmonary:     Effort: Pulmonary effort is normal. No respiratory distress.     Breath sounds: Normal breath sounds. No wheezing or rales.     Comments: Good air exch Abdominal:     General: Abdomen is flat. Bowel sounds are normal. There is no distension.     Comments: No suprapubic tenderness or fullness   No cva tenderness   Musculoskeletal:     Right lower leg: No edema.     Left lower leg: No edema.  Lymphadenopathy:     Cervical: No cervical adenopathy.  Skin:    Findings: Erythema present.     Comments: Midline sacral healed/scabbed wound area (no open skin)  2 cm of surrounding erythema in circular pattern- mildly tender No swelling or fluctuance  Neurological:     Mental Status: She is alert.     Coordination: Coordination abnormal.     Gait: Gait abnormal.  Psychiatric:        Mood and Affect: Mood normal.     Comments: Slowed cognition  Also hard of hearing           Assessment & Plan:   Problem List Items Addressed This Visit      Musculoskeletal and Integument   Pressure injury of deep tissue of sacral region - Primary    Not ulcerated at this time Pt uses barrier cream - was sensitive  Disc imp of offloading this area when sitting and lying down  She does get up frequently  inst family to watch for any skin breakdown or open areas and let us know  Also if  increase in redness of discomfort         Other   Post-concussion headache    Acute on chronic headache-worse since concussion  She takes gabapentin tid and no improvement (per pt may be worse) Rev neuro notes Will check in with Chloe Cooper re: advisement        Generalized weakness    At advanced age of [id number]  Needs assist for any mobility  Did not qualify for hospice  Has consult for palliative care this week-this will be helpful      Urine frequency    Some leukocytes on ua  Sent for cx  Enc fluid  intake       Relevant Orders   POCT Urinalysis Dipstick (Automated) (Completed)   Urine Culture

## 2018-11-27 NOTE — Assessment & Plan Note (Signed)
At advanced age of 83  Needs assist for any mobility  Did not qualify for hospice  Has consult for palliative care this week-this will be helpful

## 2018-11-27 NOTE — Assessment & Plan Note (Signed)
Some leukocytes on ua  Sent for cx  Enc fluid intake

## 2018-11-27 NOTE — Assessment & Plan Note (Signed)
Acute on chronic headache-worse since concussion  She takes gabapentin tid and no improvement (per pt may be worse) Rev neuro notes Will check in with Dr Jaynee Eagles re: advisement

## 2018-11-27 NOTE — Assessment & Plan Note (Signed)
Not ulcerated at this time Pt uses barrier cream - was sensitive  Disc imp of offloading this area when sitting and lying down  She does get up frequently  inst family to watch for any skin breakdown or open areas and let us know  Also if increase in redness of discomfort

## 2018-11-29 ENCOUNTER — Telehealth: Payer: Self-pay | Admitting: *Deleted

## 2018-11-29 ENCOUNTER — Telehealth: Payer: Self-pay | Admitting: Family Medicine

## 2018-11-29 ENCOUNTER — Other Ambulatory Visit: Payer: Medicare Other | Admitting: Hospice

## 2018-11-29 ENCOUNTER — Other Ambulatory Visit: Payer: Self-pay

## 2018-11-29 DIAGNOSIS — Z515 Encounter for palliative care: Secondary | ICD-10-CM

## 2018-11-29 LAB — URINE CULTURE
MICRO NUMBER:: 903597
SPECIMEN QUALITY:: ADEQUATE

## 2018-11-29 MED ORDER — NITROFURANTOIN MONOHYD MACRO 100 MG PO CAPS: 100.0000 mg | ORAL_CAPSULE | Freq: Two times a day (BID) | ORAL | 0 refills | Status: AC

## 2018-11-29 NOTE — Progress Notes (Signed)
Therapist, nutritional Palliative Care Consult Note Telephone: [phone]  Fax: [phone] Due to the COVID-19 crisis, this visit was done via telemedicine and it was initiated and consent by this patient and or family. Video-audio (telehealth) contact was unable to be done due to technical barriers from the patient's side.  PATIENT NAME: Chloe Cooper DOB: September 07, 1916 MRN: [id number]  PRIMARY CARE PROVIDER:   Judy Pimple, MD  REFERRING PROVIDER:  Judy Pimple, MD [address] [place],  [place] [address]  RESPONSIBLE PARTYSkyleigh Cooper [phone]   ASSESSMENT:        RECOMMENDATIONS and PLAN:  1. Advance Care Planning/Goals of Care: Goals include to maximize quality of life and symptom management. She is a DNR.  Speaking with Chloe Cooper, I introduced the usefulness of MOST form as a tool of advance care planning, answering his questions. F/u meeting 12/07/2018 for further review of goals of care and the MOST form.   2. Symptom management: Ongoing headache related to a fall in March in which she hit her head. She has appointment with Neurologist tomorrow for management of the headache. Currently on Tylenol and Gabapentin; to continue as ordered.   3. Follow up Palliative Care Visit: Palliative care will continue to follow for goals of care clarification and symptom management. Appointment set for 12/07/2018 for further review of MOST form, goals of care clarification.   I spent 40 minutes providing this consultation, from 9.00am to 9.40am   More than 50% of the time in this consultation was spent coordinating advance care communication.   HISTORY OF PRESENT ILLNESS:  Chloe Cooper is a 85 y.o. female with multiple medical problems including degenerative lumber disc disease, arthritis, osteoporosis, headaches,  hyperlipidemia.  Palliative Care was asked to help address goals of care.   CODE STATUS: DNR  PPS: 40% HOSPICE  ELIGIBILITY/DIAGNOSIS: TBD  PAST MEDICAL HISTORY:  Past Medical History:  Diagnosis Date  . Arthritis    Osteoarthritis hands and knees/?RA  . Baker's cyst   . Cataract   . Degenerative disc disease, lumbar    some spinal stenosis  . Diverticulitis 2009  . Gallstones 2009  . GERD (gastroesophageal reflux disease)   . Hyperlipidemia   . Osteoporosis   . Recurrent UTI   . Shingles   . Wrist fracture, right 2008    SOCIAL HX:  Social History   Tobacco Use  . Smoking status: Never Smoker  . Smokeless tobacco: Never Used  Substance Use Topics  . Alcohol use: Never    Alcohol/week: 0.0 standard drinks    Frequency: Never    ALLERGIES:  Allergies  Allergen Reactions  . Alendronate Sodium Swelling    REACTION: GI  . Penicillins     REACTION: rash  . Simvastatin     Leg pain and cramping   . Sulfonamide Derivatives     REACTION: rash  . Tramadol Other (See Comments)    Confusion and mood change     PERTINENT MEDICATIONS:  Outpatient Encounter Medications as of 11/29/2018  Medication Sig  . acetaminophen (TYLENOL) 500 MG tablet Take 500 mg by mouth every 8 (eight) hours as needed for mild pain.   . Bismuth Subsalicylate (PEPTO-BISMOL PO) Take by mouth as needed.  . Cholecalciferol (VITAMIN D3 PO) Take 1 capsule by mouth daily.  . fluticasone (FLONASE) 50 MCG/ACT nasal Cooper Place 1 Cooper into both nostrils daily. (Patient taking differently: Place 1 Cooper into both  nostrils daily as needed. )  . gabapentin (NEURONTIN) 100 MG capsule TAKE 1 CAPSULE BY MOUTH THREE TIMES A DAY  . Hypromellose ([employer]S GENTLE LUBRICANT EYE DROPS OP) Apply 2 drops to eye daily as needed (dry eyes).   . iron polysaccharides (FERREX 150) 150 MG capsule Take 1 capsule (150 mg total) by mouth 2 (two) times daily.  . polyethylene glycol (MIRALAX / GLYCOLAX) packet Take 17 g by mouth daily.  . ranitidine (ZANTAC) 75 MG tablet Take 75 mg by mouth daily as needed for heartburn.   Marland Kitchen UNABLE TO FIND Apply  topically as needed (back pain). Med Name: hemp cream for pain   No facility-administered encounter medications on file as of 11/29/2018.     Chloe Spray, NP

## 2018-11-29 NOTE — Telephone Encounter (Signed)
Urine culture is positive for infection I sent a px for macrobid to the pharmacy ([facility])  Please let me know if any problems or if urinary symptoms do not improve

## 2018-11-29 NOTE — Telephone Encounter (Signed)
Spoke with son, Ronalee Belts (on [place]). He scheduled an appt for the patient with Amy NP tomorrow @ 9:30 AM arrival 15-30 minutes early.

## 2018-11-29 NOTE — Telephone Encounter (Signed)
Patient's caregiver Ronalee Belts notified as instructed by telephone and verbalized understanding. Ronalee Belts stated that he will go pick it up now. Ronalee Belts stated that she is scheduled to see the doctor tomorrow about her headaches.

## 2018-11-29 NOTE — Telephone Encounter (Signed)
-----   Message from Melvenia Beam, MD sent at 11/28/2018  9:06 AM EDT ----- I will get her back for a follow up! Vivien Rota ----- Message ----- From: Abner Greenspan, MD Sent: 11/28/2018   8:16 AM EDT To: Melvenia Beam, MD  I saw our mutual 85 year old pt yesterday for acute on chronic headaches.  Last visit you started her on low dose gabapentin -per pt her headaches are worsening.  Would you please take a look at her chart and let me know what you think I should do next.  (challenging with advanced age) Thanks!

## 2018-11-30 ENCOUNTER — Other Ambulatory Visit: Payer: Self-pay

## 2018-11-30 ENCOUNTER — Telehealth: Payer: Self-pay | Admitting: Family Medicine

## 2018-11-30 ENCOUNTER — Ambulatory Visit: Admission: RE | Admit: 2018-11-30 | Payer: Medicare Other | Source: Ambulatory Visit

## 2018-11-30 ENCOUNTER — Ambulatory Visit (INDEPENDENT_AMBULATORY_CARE_PROVIDER_SITE_OTHER): Payer: Medicare Other | Admitting: Family Medicine

## 2018-11-30 ENCOUNTER — Ambulatory Visit
Admission: RE | Admit: 2018-11-30 | Discharge: 2018-11-30 | Disposition: A | Discharge status: Home / Self Care | Payer: Medicare Other | Source: Ambulatory Visit | Attending: Family Medicine | Admitting: Family Medicine

## 2018-11-30 ENCOUNTER — Encounter: Payer: Self-pay | Admitting: Family Medicine

## 2018-11-30 VITALS — BP 114/72 | HR 72 | Temp 96.8°F | Ht 65.0 in | Wt 119.0 lb

## 2018-11-30 DIAGNOSIS — R51 Headache: Secondary | ICD-10-CM | POA: Diagnosis not present

## 2018-11-30 DIAGNOSIS — E538 Deficiency of other specified B group vitamins: Secondary | ICD-10-CM | POA: Diagnosis not present

## 2018-11-30 DIAGNOSIS — H5702 Anisocoria: Secondary | ICD-10-CM | POA: Diagnosis not present

## 2018-11-30 DIAGNOSIS — R519 Headache, unspecified: Secondary | ICD-10-CM

## 2018-11-30 DIAGNOSIS — G8929 Other chronic pain: Secondary | ICD-10-CM

## 2018-11-30 DIAGNOSIS — G43709 Chronic migraine without aura, not intractable, without status migrainosus: Secondary | ICD-10-CM | POA: Diagnosis not present

## 2018-11-30 MED ORDER — GABAPENTIN 300 MG PO CAPS: 300.0000 mg | ORAL_CAPSULE | Freq: Three times a day (TID) | ORAL | 11 refills | Status: AC

## 2018-11-30 MED ORDER — IOPAMIDOL (ISOVUE-370) INJECTION 76%
50.0000 mL | Freq: Once | INTRAVENOUS | Status: AC | PRN
  Administered 2018-11-30: 50 mL via INTRAVENOUS

## 2018-11-30 NOTE — Patient Instructions (Signed)
We will increase gabapentin to 300mg  three times daily. I recommend increasing dose slowly. Try 300mg  in am, 100mg  at lunch and 100mg  at dinner for 1 week. If well tolerated, may increase to 300mg  in am and pm and 100mg  at lunch for 1 week then may increase to 300mg  three times daily.  We will update labs today and order CT/CTA per request.   Follow up in 6 weeks, sooner if needed  General Headache Without Cause A headache is pain or discomfort that is felt around the head or neck area. There are many causes and types of headaches. In some cases, the cause may not be found. Follow these instructions at home: Watch your condition for any changes. Let your doctor know about them. Take these steps to help with your condition: Managing pain      Take over-the-counter and prescription medicines only as told by your doctor.  Lie down in a dark, quiet room when you have a headache.  If told, put ice on your head and neck area: ? Put ice in a plastic bag. ? Place a towel between your skin and the bag. ? Leave the ice on for 20 minutes, 2-3 times per day.  If told, put heat on the affected area. Use the heat source that your doctor recommends, such as a moist heat pack or a heating pad. ? Place a towel between your skin and the heat source. ? Leave the heat on for 20-30 minutes. ? Remove the heat if your skin turns bright red. This is very important if you are unable to feel pain, heat, or cold. You may have a greater risk of getting burned.  Keep lights dim if bright lights bother you or make your headaches worse. Eating and drinking  Eat meals on a regular schedule.  If you drink alcohol: ? Limit how much you use to:  0-1 drink a day for women.  0-2 drinks a day for men. ? Be aware of how much alcohol is in your drink. In the [place] one drink equals one 12 oz bottle of beer (355 mL), one 5 oz glass of wine (148 mL), or one 1 oz glass of hard liquor (44 mL).  Stop drinking  caffeine, or reduce how much caffeine you drink. General instructions   Keep a journal to find out if certain things bring on headaches. For example, write down: ? What you eat and drink. ? How much sleep you get. ? Any change to your diet or medicines.  Get a massage or try other ways to relax.  Limit stress.  Sit up straight. Do not tighten (tense) your muscles.  Do not use any products that contain nicotine or tobacco. This includes cigarettes, e-cigarettes, and chewing tobacco. If you need help quitting, ask your doctor.  Exercise regularly as told by your doctor.  Get enough sleep. This often means 7-9 hours of sleep each night.  Keep all follow-up visits as told by your doctor. This is important. Contact a doctor if:  Your symptoms are not helped by medicine.  You have a headache that feels different than the other headaches.  You feel sick to your stomach (nauseous) or you throw up (vomit).  You have a fever. Get help right away if:  Your headache gets very bad quickly.  Your headache gets worse after a lot of physical activity.  You keep throwing up.  You have a stiff neck.  You have trouble seeing.  You have trouble  speaking.  You have pain in the eye or ear.  Your muscles are weak or you lose muscle control.  You lose your balance or have trouble walking.  You feel like you will pass out (faint) or you pass out.  You are mixed up (confused).  You have a seizure. Summary  A headache is pain or discomfort that is felt around the head or neck area.  There are many causes and types of headaches. In some cases, the cause may not be found.  Keep a journal to help find out what causes your headaches. Watch your condition for any changes. Let your doctor know about them.  Contact a doctor if you have a headache that is different from usual, or if your headache is not helped by medicine.  Get help right away if your headache gets very bad, you throw  up, you have trouble seeing, you lose your balance, or you have a seizure. This information is not intended to replace advice given to you by your health care provider. Make sure you discuss any questions you have with your health care provider. Document Released: 12/02/2007 Document Revised: 09/12/2017 Document Reviewed: 09/12/2017 Elsevier Patient Education  2020 [employer].

## 2018-11-30 NOTE — Progress Notes (Addendum)
PATIENT: Chloe Cooper DOB: 12/17/1916  REASON FOR VISIT: follow up HISTORY FROM: patient  Chief Complaint  Patient presents with   Headache    rm 9, son- Kathlene NovemberMike, "continued headache even with Gabapentin"     HISTORY OF PRESENT ILLNESS: Today 11/30/18 Chloe Cooper is a [age] y.o. female here today for follow up for continued headaches.  She was seen by Dr. Lucia GaskinsAhern in February after a recent fall and treated for postconcussive headaches.  She has taken gabapentin 100 mg 3 times daily.  She has tolerated the medication well but it is uncertain if it has helped much.  She is a poor historian.  Her son, Kathlene NovemberMike, is here with her today and states that over the past few weeks she has complained constantly of a headache.  Aubree points to the right occipital region when asked where the pain he is.  She feels that it is more of an achy sensation and unable to define pain more clearly.  She feels that it is a constant pain.  She feels that there may be some relief with gabapentin.  She denies any side effects after taking gabapentin.  The family is concerned about continued headaches.  CT in February was unremarkable for acute findings.  They deny any new symptoms.  No vomiting.  Some nausea from time to time.  She has appeared normal to her family at home.  No increased confusion.  She does have a history of B12 deficiency.  She receives injections with primary care.  She is due for updated labs.    HPI:  Chloe Cooper is a 85 y.o. female here as requested by provider Tower, Audrie GallusMarne A, MD for headache. PMHx HTN, osteoporosis, back pain, anemia, b12 deficiency, hematoma of left parietal scalp after a fall, chronic headache, hemiarthroplasty. She fell. A pain pill doesn't help. The whole head hurts. She also has dizziness. It just stays there all the time. She is a poor historian. Son says she takes tylenol codeine which helps. Better when she goes to bed and she sleeps well, she complains with it all  day. Saturday night she was throwing up the headache was so bad and she went to the ED. She was screaming and hollering. No vision changes with the headaches, unknown triggers. Still sore where she fell and hit her head. Fall happened 2 months ago, right before christmas. She is a poor historian, son provides most information. She is sleepin well. Son provides most information, she is a poor historian. No other focal neurologic deficits, associated symptoms, inciting events or modifiable factors.  Reviewed notes, labs and imaging from outside physicians, which showed:  Review Dr. Lucretia Roersowers notes.  Patient appears to have chronic headaches which have been exacerbated after a fall and they have tried medication such as Tylenol, tramadol and others.  The family apparently seems to think the pain is "in her head ", patient says she is in pain every 30 minutes or so and asked for a pain pill.  They give her 1 of her iron tabs and she says that helps with the pain.  They said if she wants to complain about pain and is not time for her Tylenol they have been given her an 81 mg aspirin so between the Tylenol, and aspirin patient is stable.  Patient was seen at the emergency room for frontal headache, aching and throbbing in quality, reported she has had this since her fall.  She has had 2  CT scans.  Reviewed CT of the head images 04/25/2018, nothing acute   REVIEW OF SYSTEMS: Out of a complete 14 system review of symptoms, the patient complains only of the following symptoms, headaches, back pain and all other reviewed systems are negative.  ALLERGIES: Allergies  Allergen Reactions   Alendronate Sodium Swelling    REACTION: GI   Penicillins     REACTION: rash   Simvastatin     Leg pain and cramping    Sulfonamide Derivatives     REACTION: rash   Tramadol Other (See Comments)    Confusion and mood change    HOME MEDICATIONS: Outpatient Medications Prior to Visit  Medication Sig Dispense Refill    acetaminophen (TYLENOL) 500 MG tablet Take 500 mg by mouth every 8 (eight) hours as needed for mild pain.      Cholecalciferol (VITAMIN D3 PO) Take 1 capsule by mouth daily.     fluticasone (FLONASE) 50 MCG/ACT nasal spray Place 1 spray into both nostrils daily. (Patient taking differently: Place 1 spray into both nostrils daily as needed. ) 16 g 1   Hypromellose ([employer] GENTLE LUBRICANT EYE DROPS OP) Apply 2 drops to eye daily as needed (dry eyes).      iron polysaccharides (FERREX 150) 150 MG capsule Take 1 capsule (150 mg total) by mouth 2 (two) times daily. 180 capsule 3   nitrofurantoin, macrocrystal-monohydrate, (MACROBID) 100 MG capsule Take 1 capsule (100 mg total) by mouth 2 (two) times daily. 14 capsule 0   polyethylene glycol (MIRALAX / GLYCOLAX) packet Take 17 g by mouth daily. 14 each 0   ranitidine (ZANTAC) 75 MG tablet Take 75 mg by mouth daily as needed for heartburn.      UNABLE TO FIND Apply topically as needed (back pain). Med Name: hemp cream for pain     gabapentin (NEURONTIN) 100 MG capsule TAKE 1 CAPSULE BY MOUTH THREE TIMES A DAY 90 capsule 3   Bismuth Subsalicylate (PEPTO-BISMOL PO) Take by mouth as needed.     No facility-administered medications prior to visit.     PAST MEDICAL HISTORY: Past Medical History:  Diagnosis Date   Arthritis    Osteoarthritis hands and knees/?RA   Baker's cyst    Cataract    Degenerative disc disease, lumbar    some spinal stenosis   Diverticulitis 2009   Gallstones 2009   GERD (gastroesophageal reflux disease)    Hyperlipidemia    Osteoporosis    Recurrent UTI    Shingles    Wrist fracture, right 2008    PAST SURGICAL HISTORY: Past Surgical History:  Procedure Laterality Date   APPENDECTOMY     BREAST SURGERY     cyst removed left breast   cataracts surg     bil   CHOLECYSTECTOMY  2012    HIP ARTHROPLASTY Left 02/24/2018   Procedure: ARTHROPLASTY (HEMIARTHROPLASTY) non cement;  Surgeon:  Dorna Leitz, MD;  Location: [facility];  Service: Orthopedics;  Laterality: Left;   PARTIAL HYSTERECTOMY     skin lesions removed     skin cancer   TRIGGER FINGER RELEASE     bil hands   tumor removed     removed from lining of heart    FAMILY HISTORY: Family History  Problem Relation Age of Onset   Heart disease Father        CAD   Heart disease Brother        CAD   Heart disease Brother  CAD   Cancer Mother    Diverticulitis Sister    Migraines Neg Hx    Headache Neg Hx     SOCIAL HISTORY: Social History   Socioeconomic History   Marital status: Widowed    Spouse name: Not on file   Number of children: 3   Years of education: Not on file   Highest education level: 9th grade  Occupational History   Occupation: Retired   Ecologist strain: Not on file   Food insecurity    Worry: Not on file    Inability: Not on Occupational hygienist needs    Medical: Not on file    Non-medical: Not on file  Tobacco Use   Smoking status: Never Smoker   Smokeless tobacco: Never Used  Substance and Sexual Activity   Alcohol use: Never    Alcohol/week: 0.0 standard drinks    Frequency: Never   Drug use: Never   Sexual activity: Never    Birth control/protection: Surgical  Lifestyle   Physical activity    Days per week: Not on file    Minutes per session: Not on file   Stress: Not on file  Relationships   Social connections    Talks on phone: Not on file    Gets together: Not on file    Attends religious service: Not on file    Active member of club or organization: Not on file    Attends meetings of clubs or organizations: Not on file    Relationship status: Not on file   Intimate partner violence    Fear of current or ex partner: Not on file    Emotionally abused: Not on file    Physically abused: Not on file    Forced sexual activity: Not on file  Other Topics Concern   Not on file  Social History Narrative     Lives at home with alone however has someone there 24/7. Sons alternate weeks spent with patient and there is additional caretaker that comes in 5 days per week for 4 hours.       Coffee and Pepsi daily: 1/2 cup coffee & 1/3 cup pepsi      PHYSICAL EXAM  Vitals:   11/30/18 0929  BP: 114/72  Pulse: 72  Temp: (!) 96.8 F (36 C)  Weight: 119 lb (54 kg)  Height:  (1.651 m)   Body mass index is 19.8 kg/m.  Generalized: Well developed, in no acute distress  Cardiology: normal rate and rhythm, no murmur noted Neurological examination  Mentation: Alert, she is not oriented to time but is oriented to place and some history taking. Follows all commands speech and language fluent Cranial nerve II-XII: Pupils were unequal, L>R, left with sluggish reaction, right reactive, extraocular movements were full, visual field were full on confrontational test. Facial sensation and strength were normal. Uvula tongue midline. Head turning and shoulder shrug  were normal and symmetric. Motor: The motor testing reveals 4 over 5 strength of all 4 extremities. Good symmetric motor tone is noted throughout.  Sensory: Sensory testing is intact to soft touch on all 4 extremities. No evidence of extinction is noted.  Coordination: Cerebellar testing reveals good finger-nose-finger and heel-to-shin bilaterally.  Gait and station: Not tested, patient does not feel she can walk steadily    DIAGNOSTIC DATA (LABS, IMAGING, TESTING) - I reviewed patient records, labs, notes, testing and imaging myself where available.  MMSE - Mini Mental State  Exam 11/19/2015  Orientation to time 5  Orientation to Place 5  Registration 3  Attention/ Calculation 0  Recall 3  Language- name 2 objects 0  Language- repeat 1  Language- follow 3 step command 2  Language- follow 3 step command-comments pt was unable to recall 1 step of 3 step command  Language- read & follow direction 0  Write a sentence 0  Copy design 0   Total score 19     Lab Results  Component Value Date   WBC 6.9 04/12/2018   HGB 11.0 (L) 04/12/2018   HCT 33.5 (L) 04/12/2018   MCV 97.1 04/12/2018   PLT 342.0 04/12/2018      Component Value Date/Time   NA 136 04/12/2018 1439   K 4.2 04/12/2018 1439   CL 101 04/12/2018 1439   CO2 26 04/12/2018 1439   GLUCOSE 87 04/12/2018 1439   BUN 21 04/12/2018 1439   CREATININE 0.87 04/12/2018 1439   CALCIUM 9.4 04/12/2018 1439   PROT 7.0 02/13/2018 1452   ALBUMIN 4.0 02/13/2018 1452   AST 10 02/13/2018 1452   ALT 5 02/13/2018 1452   ALKPHOS 88 02/13/2018 1452   BILITOT 0.3 02/13/2018 1452   GFRNONAA 49 (L) 02/25/2018 0232   GFRAA 57 (L) 02/25/2018 0232   Lab Results  Component Value Date   CHOL 219 (H) 02/13/2018   HDL 47.70 02/13/2018   LDLCALC 90 10/05/2011   LDLDIRECT 140.0 02/13/2018   TRIG 253.0 (H) 02/13/2018   CHOLHDL 5 02/13/2018   No results found for: HGBA1C Lab Results  Component Value Date   VITAMINB12 764 02/13/2018   Lab Results  Component Value Date   TSH 1.54 02/13/2018     ASSESSMENT AND PLAN 85 y.o. year old female  has a past medical history of Arthritis, Baker's cyst, Cataract, Degenerative disc disease, lumbar, Diverticulitis (2009), Gallstones (2009), GERD (gastroesophageal reflux disease), Hyperlipidemia, Osteoporosis, Recurrent UTI, Shingles, and Wrist fracture, right (2008). here with     ICD-10-CM   1. Chronic nonintractable headache, unspecified headache type  R51 C-reactive Protein    Sedimentation Rate    CMP    CBC with Differential/Platelets    CT ANGIO HEAD W OR WO CONTRAST    CANCELED: CT HEAD WO CONTRAST  2. B12 deficiency  E53.8 Vitamin B12  3. Pupil asymmetry  H57.02 CT ANGIO HEAD W OR WO CONTRAST    CANCELED: CT HEAD WO CONTRAST    Unfortunately, Ms Tobey continues to have daily chronic headaches.  It is difficult to assess quality of headache.  She has responded well to gabapentin and is tolerating with no obvious adverse  effects.  We will increase gabapentin dose to 300 mg 3 times daily with a slow titration as directed.  Family will start with 300 mg in the morning, 100 mg at lunch and 100 mg at dinner for 1 week.  If well tolerated they may increase by 1 300 mg tablet each week.  I have offered a nerve block in the office but patient declined.  Neuro exam is unremarkable today with the exception of unequal pupils.  Patient does have a cataract history but unable to tell me more details.  I am unable to locate details of surgery in the chart.  I am unable to verify past history of asymmetry or sluggish reactivity with recent exams.  I have discussed these findings with the family.  They are aware that changes are most likely to be related to cataract surgery.  They wish to proceed with imaging to evaluate.  CTA ordered.  Patient and her son given instructions of where to obtain imaging.  We will also order labs today for evaluation.  She will continue follow-up with PCP for chronic disease and B12 deficiency.  They will call with any urgent or emergent symptoms as directed.  911 suggested for any strokelike symptoms.  They will follow-up with [place] pending imaging results.  Both Wandy and Kathlene November verbalized understanding and agreement with this plan.   Orders Placed This Encounter  Procedures   CT ANGIO HEAD W OR WO CONTRAST    Left pupil> R, sluggish reactivity    Standing Status:   Future    Number of Occurrences:   1    Standing Expiration Date:   01/30/2020    Order Specific Question:   If indicated for the ordered procedure, I authorize the administration of contrast media per Radiology protocol    Answer:   Yes    Order Specific Question:   Preferred imaging location?    Answer:   [facility]    Order Specific Question:   Radiology Contrast Protocol - do NOT remove file path    Answer:   [email or web address]   C-reactive Protein   Sedimentation Rate   CMP   CBC with  Differential/Platelets   Vitamin B12     Meds ordered this encounter  Medications   gabapentin (NEURONTIN) 300 MG capsule    Sig: Take 1 capsule (300 mg total) by mouth 3 (three) times daily.    Dispense:  90 capsule    Refill:  11    Order Specific Question:   Supervising Provider    Answer:   Anson Fret [id number]      I spent 30 minutes with the patient. 50% of this time was spent counseling and educating patient on plan of care and medications.    Shawnie Dapper, FNP-C 11/30/2018, 3:48 PM [facility] [address] [place], [place] [address] [phone]  Made any corrections needed, and agree with history, physical, neuro exam,assessment and plan as stated.     Naomie Dean, MD [facility]

## 2018-11-30 NOTE — Telephone Encounter (Signed)
[employer] medicare no auth patient is going to walk into GI and get CT.

## 2018-12-01 LAB — CBC WITH DIFFERENTIAL/PLATELET
Basophils Absolute: 0.1 10*3/uL (ref 0.0–0.2)
Basos: 1 %
EOS (ABSOLUTE): 0.1 10*3/uL (ref 0.0–0.4)
Eos: 2 %
Hematocrit: 32.2 % — ABNORMAL LOW (ref 34.0–46.6)
Hemoglobin: 10.7 g/dL — ABNORMAL LOW (ref 11.1–15.9)
Immature Grans (Abs): 0 10*3/uL (ref 0.0–0.1)
Immature Granulocytes: 1 %
Lymphocytes Absolute: 1.2 10*3/uL (ref 0.7–3.1)
Lymphs: 14 %
MCH: 32.3 pg (ref 26.6–33.0)
MCHC: 33.2 g/dL (ref 31.5–35.7)
MCV: 97 fL (ref 79–97)
Monocytes Absolute: 0.7 10*3/uL (ref 0.1–0.9)
Monocytes: 8 %
Neutrophils Absolute: 6.4 10*3/uL (ref 1.4–7.0)
Neutrophils: 74 %
Platelets: 330 10*3/uL (ref 150–450)
RBC: 3.31 x10E6/uL — ABNORMAL LOW (ref 3.77–5.28)
RDW: 11.9 % (ref 11.7–15.4)
WBC: 8.5 10*3/uL (ref 3.4–10.8)

## 2018-12-01 LAB — COMPREHENSIVE METABOLIC PANEL
ALT: 6 IU/L (ref 0–32)
AST: 9 IU/L (ref 0–40)
Albumin/Globulin Ratio: 1.6 (ref 1.2–2.2)
Albumin: 3.8 g/dL (ref 3.5–4.6)
Alkaline Phosphatase: 106 IU/L (ref 39–117)
BUN/Creatinine Ratio: 20 (ref 12–28)
BUN: 19 mg/dL (ref 10–36)
Bilirubin Total: 0.2 mg/dL (ref 0.0–1.2)
CO2: 26 mmol/L (ref 20–29)
Calcium: 9.5 mg/dL (ref 8.7–10.3)
Chloride: 101 mmol/L (ref 96–106)
Creatinine, Ser: 0.94 mg/dL (ref 0.57–1.00)
GFR calc Af Amer: 58 mL/min/{1.73_m2} — ABNORMAL LOW (ref >59)
GFR calc non Af Amer: 50 mL/min/{1.73_m2} — ABNORMAL LOW (ref >59)
Globulin, Total: 2.4 g/dL (ref 1.5–4.5)
Glucose: 103 mg/dL — ABNORMAL HIGH (ref 65–99)
Potassium: 4.7 mmol/L (ref 3.5–5.2)
Sodium: 138 mmol/L (ref 134–144)
Total Protein: 6.2 g/dL (ref 6.0–8.5)

## 2018-12-01 LAB — SEDIMENTATION RATE: Sed Rate: 29 mm/hr (ref 0–40)

## 2018-12-01 LAB — VITAMIN B12: Vitamin B-12: 480 pg/mL (ref 232–1245)

## 2018-12-01 LAB — C-REACTIVE PROTEIN: CRP: 25 mg/L — ABNORMAL HIGH (ref 0–10)

## 2018-12-05 ENCOUNTER — Telehealth: Payer: Self-pay | Admitting: *Deleted

## 2018-12-05 NOTE — Telephone Encounter (Signed)
-----   Message from Amy Lomax, NP sent at 12/04/2018  7:25 AM EDT ----- Please let her son know that the CT was overall unremarkable. Some changes related to age but no obvious cause of headache. There was mention of ethmoid sinus disease which sometimes can cause a headache but not sure it is related to the pain she was describing. I would have them talk to PCP about looking for potential sinus infection if headache does not resolve. 

## 2018-12-05 NOTE — Telephone Encounter (Signed)
-----   Message from Debbora Presto, NP sent at 12/04/2018  7:25 AM EDT ----- Please let her son know that the CT was overall unremarkable. Some changes related to age but no obvious cause of headache. There was mention of ethmoid sinus disease which sometimes can cause a headache but not sure it is related to the pain she was describing. I would have them talk to PCP about looking for potential sinus infection if headache does not resolve.

## 2018-12-05 NOTE — Telephone Encounter (Signed)
I called and LM (detailed) ok per DPR for son  that the CT was overall unremarkable. Some changes related to age but no obvious cause of headache. There was mention of ethmoid sinus disease which sometimes can cause a headache but not sure it is related to the pain she was describing. I would have them talk to PCP about looking for potential sinus infection if headache does not resolve. They are to call back if questions.

## 2018-12-06 ENCOUNTER — Encounter (HOSPITAL_COMMUNITY): Payer: Self-pay

## 2018-12-06 ENCOUNTER — Telehealth: Payer: Self-pay

## 2018-12-06 ENCOUNTER — Emergency Department (HOSPITAL_COMMUNITY): Payer: Medicare Other

## 2018-12-06 ENCOUNTER — Other Ambulatory Visit: Payer: Medicare Other | Admitting: Hospice

## 2018-12-06 ENCOUNTER — Emergency Department (HOSPITAL_COMMUNITY)
Admission: EM | Admit: 2018-12-06 | Discharge: 2018-12-06 | Disposition: A | Discharge status: Home / Self Care | Payer: Medicare Other | Attending: Emergency Medicine | Admitting: Emergency Medicine

## 2018-12-06 DIAGNOSIS — S32502A Unspecified fracture of left pubis, initial encounter for closed fracture: Secondary | ICD-10-CM | POA: Diagnosis not present

## 2018-12-06 DIAGNOSIS — W010XXA Fall on same level from slipping, tripping and stumbling without subsequent striking against object, initial encounter: Secondary | ICD-10-CM | POA: Diagnosis not present

## 2018-12-06 DIAGNOSIS — Z7982 Long term (current) use of aspirin: Secondary | ICD-10-CM | POA: Insufficient documentation

## 2018-12-06 DIAGNOSIS — R52 Pain, unspecified: Secondary | ICD-10-CM | POA: Diagnosis not present

## 2018-12-06 DIAGNOSIS — S3992XA Unspecified injury of lower back, initial encounter: Secondary | ICD-10-CM | POA: Diagnosis not present

## 2018-12-06 DIAGNOSIS — Z515 Encounter for palliative care: Secondary | ICD-10-CM

## 2018-12-06 DIAGNOSIS — M545 Low back pain: Secondary | ICD-10-CM | POA: Diagnosis not present

## 2018-12-06 DIAGNOSIS — Z96642 Presence of left artificial hip joint: Secondary | ICD-10-CM | POA: Insufficient documentation

## 2018-12-06 DIAGNOSIS — S8992XA Unspecified injury of left lower leg, initial encounter: Secondary | ICD-10-CM | POA: Diagnosis not present

## 2018-12-06 DIAGNOSIS — Y92002 Bathroom of unspecified non-institutional (private) residence single-family (private) house as the place of occurrence of the external cause: Secondary | ICD-10-CM | POA: Insufficient documentation

## 2018-12-06 DIAGNOSIS — Z79899 Other long term (current) drug therapy: Secondary | ICD-10-CM | POA: Diagnosis not present

## 2018-12-06 DIAGNOSIS — Y9389 Activity, other specified: Secondary | ICD-10-CM | POA: Diagnosis not present

## 2018-12-06 DIAGNOSIS — S79912A Unspecified injury of left hip, initial encounter: Secondary | ICD-10-CM | POA: Diagnosis not present

## 2018-12-06 DIAGNOSIS — M5489 Other dorsalgia: Secondary | ICD-10-CM | POA: Diagnosis not present

## 2018-12-06 DIAGNOSIS — M25552 Pain in left hip: Secondary | ICD-10-CM | POA: Diagnosis not present

## 2018-12-06 DIAGNOSIS — M25562 Pain in left knee: Secondary | ICD-10-CM | POA: Insufficient documentation

## 2018-12-06 DIAGNOSIS — R0902 Hypoxemia: Secondary | ICD-10-CM | POA: Diagnosis not present

## 2018-12-06 DIAGNOSIS — I10 Essential (primary) hypertension: Secondary | ICD-10-CM | POA: Insufficient documentation

## 2018-12-06 DIAGNOSIS — W19XXXA Unspecified fall, initial encounter: Secondary | ICD-10-CM

## 2018-12-06 DIAGNOSIS — Y999 Unspecified external cause status: Secondary | ICD-10-CM | POA: Insufficient documentation

## 2018-12-06 DIAGNOSIS — I959 Hypotension, unspecified: Secondary | ICD-10-CM | POA: Diagnosis not present

## 2018-12-06 NOTE — ED Provider Notes (Signed)
[facility]-EMERGENCY DEPT Provider Note   CSN: [id number] Arrival date & time: 12/06/18  [id number]     History   Chief Complaint Chief Complaint  Patient presents with   Fall   Knee Pain    HPI Chloe Cooper is a [age] y.o. female.     [age] y.o female with a PMH of Arthritis, GERD, Hyperlipidemia presents to the ED via EMS with a chief complaint of fall. Patient reports ambulating in the restroom yesterday when she slipped and fell landing on her left knee.  Patient reports her left knee hurts when putting pressure, she is unable to walk.  Patient is usually ambulatory with a walker.  According to patient fall was witnessed by her son, will obtain further collateral information. She is currently not on any blood thinners.   The history is provided by the patient and a relative.    Past Medical History:  Diagnosis Date   Arthritis    Osteoarthritis hands and knees/?RA   Baker's cyst    Cataract    Degenerative disc disease, lumbar    some spinal stenosis   Diverticulitis 2009   Gallstones 2009   GERD (gastroesophageal reflux disease)    Hyperlipidemia    Osteoporosis    Recurrent UTI    Shingles    Wrist fracture, right 2008    Patient Active Problem List   Diagnosis Date Noted   Pressure injury of deep tissue of sacral region 11/27/2018   Urine frequency 11/27/2018   Sacral wound 11/19/2018   Generalized weakness 11/19/2018   Post-concussion headache 05/02/2018   Fall at home, initial encounter 02/24/2018   Hematoma of left parietal scalp 02/24/2018   Prolonged QT interval 02/24/2018   Femoral neck fracture (HCC) 02/23/2018   B12 deficiency 12/24/2015   Anemia 11/25/2015   Burning sensation of mouth 04/22/2015   Lumbar pain 10/30/2014   Bilateral knee pain 10/30/2014   Encounter for Medicare annual wellness exam 02/23/2013   Headache 06/09/2012   Recurrent UTI 05/25/2011   Back pain, thoracic 11/11/2010     Thoracic back pain 11/07/2010   Constipation 09/30/2010   Osteoarthritis 08/21/2010   Vitamin D deficiency 10/08/2008   ESSENTIAL HYPERTENSION, BENIGN 10/02/2008   HYPERCHOLESTEROLEMIA 07/19/2006   Osteoporosis 07/19/2006    Past Surgical History:  Procedure Laterality Date   APPENDECTOMY     BREAST SURGERY     cyst removed left breast   cataracts surg     bil   CHOLECYSTECTOMY  2012    HIP ARTHROPLASTY Left 02/24/2018   Procedure: ARTHROPLASTY (HEMIARTHROPLASTY) non cement;  Surgeon: Jodi GeraldsGraves, John, MD;  Location: [facility] OR;  Service: Orthopedics;  Laterality: Left;   PARTIAL HYSTERECTOMY     skin lesions removed     skin cancer   TRIGGER FINGER RELEASE     bil hands   tumor removed     removed from lining of heart     OB History   No obstetric history on file.      Home Medications    Prior to Admission medications   Medication Sig Start Date End Date Taking? Authorizing Provider  acetaminophen (TYLENOL) 500 MG tablet Take 500 mg by mouth every 8 (eight) hours as needed for mild pain.    Yes [provider]  aspirin 81 MG chewable tablet Chew 81 mg by mouth 2 (two) times daily as needed for mild pain.   Yes [provider]  Cholecalciferol (VITAMIN D3 PO) Take 1 capsule  by mouth daily.   Yes [provider]  fluticasone (FLONASE) 50 MCG/ACT nasal spray Place 1 spray into both nostrils daily. Patient taking differently: Place 1 spray into both nostrils daily as needed for allergies.  12/01/16  Yes Tower, Wynelle Fanny, MD  gabapentin (NEURONTIN) 300 MG capsule Take 1 capsule (300 mg total) by mouth 3 (three) times daily. 11/30/18  Yes Lomax, Amy, NP  iron polysaccharides (FERREX 150) 150 MG capsule Take 1 capsule (150 mg total) by mouth 2 (two) times daily. 04/12/18  Yes Tower, Wynelle Fanny, MD  nitrofurantoin, macrocrystal-monohydrate, (MACROBID) 100 MG capsule Take 1 capsule (100 mg total) by mouth 2 (two) times daily. 11/29/18  Yes Tower, Wynelle Fanny, MD  polyvinyl alcohol (LIQUIFILM TEARS) 1.4 % ophthalmic solution Place 1 drop into both eyes as needed for dry eyes.   Yes [provider]  polyethylene glycol (MIRALAX / GLYCOLAX) packet Take 17 g by mouth daily. Patient not taking: Reported on 12/06/2018 02/28/18   Bonnielee Haff, MD    Family History Family History  Problem Relation Age of Onset   Heart disease Father        CAD   Heart disease Brother        CAD   Heart disease Brother        CAD   Cancer Mother    Diverticulitis Sister    Migraines Neg Hx    Headache Neg Hx     Social History Social History   Tobacco Use   Smoking status: Never Smoker   Smokeless tobacco: Never Used  Substance Use Topics   Alcohol use: Never    Alcohol/week: 0.0 standard drinks    Frequency: Never   Drug use: Never     Allergies   Alendronate sodium, Penicillins, Simvastatin, Sulfonamide derivatives, and Tramadol   Review of Systems Review of Systems  Constitutional: Negative for fever.  HENT: Negative for sore throat.   Respiratory: Negative for shortness of breath.   Cardiovascular: Negative for chest pain.  Gastrointestinal: Negative for abdominal pain, nausea and vomiting.  Genitourinary: Negative for flank pain.  Musculoskeletal: Positive for arthralgias, gait problem and myalgias. Negative for back pain.  Skin: Negative for pallor and wound.  Neurological: Negative for light-headedness.  Psychiatric/Behavioral: Negative for decreased concentration.     Physical Exam Updated Vital Signs BP 139/63 (BP Location: Right Arm)    Pulse 69    Temp 98.5 F (36.9 C) (Oral)    Resp 16    SpO2 98%   Physical Exam Vitals signs and nursing note reviewed.  Constitutional:      Appearance: Normal appearance. She is not ill-appearing.  HENT:     Head: Normocephalic and atraumatic.     Mouth/Throat:     Mouth: Mucous membranes are moist.  Eyes:     Pupils: Pupils are equal, round, and reactive to  light.  Cardiovascular:     Rate and Rhythm: Normal rate.     Pulses: Normal pulses.  Pulmonary:     Effort: Pulmonary effort is normal.     Breath sounds: Normal breath sounds. No wheezing or rales.  Abdominal:     General: Abdomen is flat. Bowel sounds are normal. There is no distension.     Tenderness: There is no abdominal tenderness. There is no guarding.  Musculoskeletal:     Left hip: She exhibits tenderness. She exhibits normal range of motion, normal strength, no bony tenderness, no swelling, no crepitus, no deformity and no laceration.  Left knee: She exhibits effusion. She exhibits normal range of motion, no swelling, no deformity, no laceration, no erythema, normal alignment and no LCL laxity. No tenderness found.       Legs:  Skin:    General: Skin is warm and dry.  Neurological:     Mental Status: She is alert and oriented to person, place, and time.      ED Treatments / Results  Labs (all labs ordered are listed, but only abnormal results are displayed) Labs Reviewed - No data to display  EKG None  Radiology Ct Lumbar Spine Wo Contrast  Result Date: 12/06/2018 CLINICAL DATA:  Low back pain after a fall last night. Initial encounter. EXAM: CT LUMBAR SPINE WITHOUT CONTRAST TECHNIQUE: Multidetector CT imaging of the lumbar spine was performed without intravenous contrast administration. Multiplanar CT image reconstructions were also generated. COMPARISON:  Plain films lumbar spine 04/11/2018. FINDINGS: Segmentation: Standard. Alignment: 1.5 cm anterolisthesis L5 on S1 due to pars interarticularis defects at L5 is unchanged. Trace retrolisthesis L1 on L2 also noted. There is some exaggeration of the normal lumbar lordosis. Vertebrae: No acute fracture or focal pathologic process. Paraspinal and other soft tissues: No acute finding. Atherosclerosis noted. Disc levels: T11-12: Negative. T12-L1: Minimal disc bulge without stenosis. L1-2: Facet degenerative change.   Otherwise negative. L2-3: Loss of disc space height and a shallow bulge. Ligamentum flavum thickening. Mild to moderate central canal and bilateral foraminal narrowing. L3-4: Minimal disc bulge and ligamentum flavum thickening. No stenosis. L4-5: Loss of disc space height and mild-to-moderate facet degenerative change. No stenosis. L5-S1: The disc is uncovered. The central canal is open. Anterolisthesis results in marked bilateral foraminal narrowing. IMPRESSION: No acute abnormality. Chronic bilateral L5 pars interarticularis defects result in 1.5 cm anterolisthesis. There is marked bilateral foraminal narrowing at this level due to anterolisthesis. Mild to moderate central canal and bilateral foraminal narrowing L2-3. Atherosclerosis. Electronically Signed   By: Drusilla Kanner M.D.   On: 12/06/2018 13:50   Ct Femur Left Wo Contrast  Result Date: 12/06/2018 CLINICAL DATA:  Status post fall last night. EXAM: CT OF THE LOWER LEFT EXTREMITY WITHOUT CONTRAST TECHNIQUE: Multidetector CT imaging of the lower left extremity was performed according to the standard protocol. COMPARISON:  None. FINDINGS: Bones/Joint/Cartilage Left hip hemiarthroplasty without hardware failure or complication. Acute nondisplaced fracture of the left pubic body. No other fracture or dislocation. Generalized osteopenia. No aggressive osseous lesion. Chondrocalcinosis of the medial and lateral femorotibial compartments as can be seen with CPPD. Mild tricompartmental osteoarthritis of left knee. Ligaments Suboptimally assessed by CT. Muscles and Tendons Muscles are normal.  No muscle atrophy. Soft tissues No fluid collection or hematoma. No soft tissue mass. Peripheral vascular atherosclerotic disease. IMPRESSION: 1. Acute nondisplaced fracture of the left pubic body. 2. Left hip hemiarthroplasty without hardware failure or complication. Electronically Signed   By: Elige Ko   On: 12/06/2018 14:26   Dg Knee Complete 4 Views  Left  Result Date: 12/06/2018 CLINICAL DATA:  Left knee pain after fall yesterday. EXAM: LEFT KNEE - COMPLETE 4+ VIEW COMPARISON:  None. FINDINGS: No evidence of fracture, dislocation, or joint effusion. Atherosclerotic calcifications are noted. Moderate narrowing of lateral joint space is noted. Chondrocalcinosis is noted involving medial and lateral joint spaces. IMPRESSION: Moderate degenerative joint disease is noted laterally. Chondrocalcinosis is noted which may represent calcium pyrophosphate deposition disease or possibly degenerative joint disease. No acute abnormality seen in the left knee. Electronically Signed   By: Zenda Alpers.D.  On: 12/06/2018 10:07   Dg Hip Unilat W Or Wo Pelvis 2-3 Views Left  Result Date: 12/06/2018 CLINICAL DATA:  Left hip pain after fall yesterday. EXAM: DG HIP (WITH OR WITHOUT PELVIS) 2-3V LEFT COMPARISON:  None. FINDINGS: There is no evidence of hip fracture or dislocation. Status post left hip arthroplasty. No fracture or dislocation is noted. IMPRESSION: Status post left hip arthroplasty.  No acute abnormality is noted. Electronically Signed   By: Lupita Raider M.D.   On: 12/06/2018 10:09    Procedures Procedures (including critical care time)  Medications Ordered in ED Medications - No data to display   Initial Impression / Assessment and Plan / ED Course  I have reviewed the triage vital signs and the nursing notes.  Pertinent labs & imaging results that were available during my care of the patient were reviewed by me and considered in my medical decision making (see chart for details).       Patient with an extensive PMH presents to the ED via EMS s/p fall. According to son at the bedside, mother was staying with his brother, he witnessed the incident.  Reports patient did not strike her head.  Today she reports left knee pain, states she was ambulatory after the incident yesterday, currently walks with a walker at baseline.  However, today  reports worsening pain to her left knee.  Stating it is very hard for her to put any weight on it.  An x-ray of her left knee along with left hip were obtained to further evaluate her injury.  I have reviewed patient's records, she is currently not on any blood thinners.  X-ray of her left hip showed: Status post left hip arthroplasty. No acute abnormality is noted. X-ray of her left knee: Moderate degenerative joint disease is noted laterally.  Chondrocalcinosis is noted which may represent calcium pyrophosphate  deposition disease or possibly degenerative joint disease. No acute  abnormality seen in the left knee.     I have personally placed 2 calls to son Chloe Cooper who witnessed the incident at [phone], without successful response.  10:25 AM Spoke to son Chloe Cooper he reports patient was coming out of bathroom, stopped the with 4 wheel walk, shot out across the room, she landed on right side, did not hit her head. She has been walking a little yesterday on it, provided with 500 mg tylenol.Today, she could not put any weight on her left knee. Has chronic back pain at baseline, taken gabapentin for it for years.   Will obtain CT to further evaluate her injury. CT femur and lumbar spine showed: No acute abnormality.    Chronic bilateral L5 pars interarticularis defects result in 1.5 cm  anterolisthesis. There is marked bilateral foraminal narrowing at  this level due to anterolisthesis.    Mild to moderate central canal and bilateral foraminal narrowing  L2-3.    Atherosclerosis.   1. Acute nondisplaced fracture of the left pubic body.  2. Left hip hemiarthroplasty without hardware failure or  complication.   Discussed these results at length with patient's son at the bedside, he states patient is currently a DNR, he is having hospice assess her next week.  He will like for patient not to be hospitalized at this time.  Attempted to have patient hospitalized however son refused.   I have discussed this with my attending Dr. Adela Lank who was also seen and evaluated patient.  He agrees with plan and management.  Patient will be discharged with  an outpatient rehabilitation form along with follow-up with Chloe Cooper for her acute nondisplaced fracture of the left pubic body.     Portions of this note were generated with Scientist, clinical (histocompatibility and immunogenetics). Dictation errors may occur despite best attempts at proofreading.  Final Clinical Impressions(s) / ED Diagnoses   Final diagnoses:  Fall, initial encounter  Acute pain of left knee    ED Discharge Orders    None       Claude Manges, PA-C 12/06/18 1444    Melene Plan, DO 12/06/18 1451

## 2018-12-06 NOTE — Telephone Encounter (Signed)
Patient's in the ER due to a fall.  Patient's sons want her to get Hospice.  Jenn at [facility] called to make sure Dr.Tower will still be the attending.  Danise Mina can be reached at [phone].  If Jenn's on the phone, a detailed message can be left on her voice mail.

## 2018-12-06 NOTE — Progress Notes (Signed)
CSW received consult for "PT/rehab". CSW spoke with PA [place] who reports patient has a "acute nondisplaced fracture of the left pubic body" and could benefit from some HH PT. CSW spoke with patient's son Legrand Como who reports that family would like for patient to move forward with hospice at home and declined [facility] services. Legrand Como reports hospice is to come see patient today at her home. CSW informed PA of the decision. No other social work needs reported. CSW signing off.   [place], LCSW Transitions of Care Department [facility] ED [phone]

## 2018-12-06 NOTE — Telephone Encounter (Signed)
In ED now  Will follow  Thanks

## 2018-12-06 NOTE — Telephone Encounter (Signed)
Chloe Cooper (DPR signed) pt fell 12/05/18 midday while going to bathroom, stroller fell out from other her. This morning pt cannot get out of bed due to left leg hurting. Advised to call 911 and take pt to ED for eval and possible xray. Chloe Cooper voiced understanding. FYI to Dr Glori Bickers.

## 2018-12-06 NOTE — ED Triage Notes (Signed)
Pt presents via EMS from home with c/o fall that occurred last night. Son reports that pt was coming out of the bathroom with her walker and she fell forward on her right side. Pt is c/o left knee pain at this time. Son reports that she has been able to stand and pivot on that knee since then but has been reporting pain with movement.

## 2018-12-06 NOTE — Telephone Encounter (Signed)
[facility] has already evaluated for palliative care  I'm happy to be attending  Let me know if I need to place order/do anything right now  Thanks

## 2018-12-06 NOTE — Discharge Instructions (Addendum)
The CT today showed: 1. Acute nondisplaced fracture of the left pubic body.   Dr. Doreatha Martin is attached to your chart, please schedule an appointment for follow-up.  Please be aware we attempted to keep you in the hospital today, although you chose not to stay.  Please follow-up with orthopedics in order to obtain outpatient rehabilitation.

## 2018-12-06 NOTE — Progress Notes (Signed)
[facility]  Received request for hospice evaluation. Eligibility confirmed with plan to admit patient to Hospice services this evening at home. Sons Legrand Como and [employer] aware. Hospital bed ordered for delivery to home after patient returns home.   Thank you,  Erling Conte, LCSW [phone]

## 2018-12-06 NOTE — Progress Notes (Signed)
Therapist, nutritional Palliative Care Consult Note Telephone: [phone]  Fax: [phone]  PATIENT NAME: Chloe Cooper DOB: [birth year] MRN: [id number]  PRIMARY CARE PROVIDER:   Judy Pimple, MD  REFERRING PROVIDER:  Judy Pimple, MD [address] [place],  [place] [address]  RESPONSIBLE PARTYAlandra Sando [phone]    Due to the COVID-19 crisis, this telephone evaluation and treatment contact was done via telephone and it was initiated and consent by this patient and or family  RECOMMENDATIONS and PLAN:   Advance care Planning/Goals of care: Kathlene November reported patient fell yesterday and by today was no longer able to get out of bed and so it was decided she should go to the ED for Xrays and possible work up. He affirmed patient is a DNR. I discussed the goals of care and what the preferences and values are at this time. He expressed thanks for helping him clarify the goals of care which; he said he has been thinking about that; he would go in to see the Doctor and further discuss Hospice care for patient. His decision was validated, while stressing the importance of advance directive, clarifying end of life wishes with advancing age and chronic progressive diseases. He verbalized understanding and expressed thanks for the support and information provided. Will follow up whether patient's Doctor recommends patient transitions to Hospice care or remains in Palliative.   I spent 35 minutes providing this consultation,  from 12.00pm to 1.35pm. More than 50% of the time in this consultation was spent coordinating advance care communication.   HISTORY OF PRESENT ILLNESS:  Chloe Cooper is a 85 y.o. female with multiple medical problems including degenerative lumber disc disease, arthritis, osteoporosis, headaches,  hyperlipidemia.  Palliative Care was asked to help address goals of care.   CODE STATUS: DNR  PPS: 30% HOSPICE ELIGIBILITY/DIAGNOSIS: TBD   PAST MEDICAL HISTORY:  Past Medical History:  Diagnosis Date  . Arthritis    Osteoarthritis hands and knees/?RA  . Baker's cyst   . Cataract   . Degenerative disc disease, lumbar    some spinal stenosis  . Diverticulitis 2009  . Gallstones 2009  . GERD (gastroesophageal reflux disease)   . Hyperlipidemia   . Osteoporosis   . Recurrent UTI   . Shingles   . Wrist fracture, right 2008    SOCIAL HX:  Social History   Tobacco Use  . Smoking status: Never Smoker  . Smokeless tobacco: Never Used  Substance Use Topics  . Alcohol use: Never    Alcohol/week: 0.0 standard drinks    Frequency: Never    ALLERGIES:  Allergies  Allergen Reactions  . Alendronate Sodium Swelling    REACTION: GI  . Penicillins     REACTION: rash  . Simvastatin     Leg pain and cramping   . Sulfonamide Derivatives     REACTION: rash  . Tramadol Other (See Comments)    Confusion and mood change     PERTINENT MEDICATIONS:  Outpatient Encounter Medications as of 12/06/2018  Medication Sig  . acetaminophen (TYLENOL) 500 MG tablet Take 500 mg by mouth every 8 (eight) hours as needed for mild pain.   Marland Kitchen aspirin 81 MG chewable tablet Chew 81 mg by mouth 2 (two) times daily as needed for mild pain.  . Cholecalciferol (VITAMIN D3 PO) Take 1 capsule by mouth daily.  . fluticasone (FLONASE) 50 MCG/ACT nasal spray Place 1 spray into both nostrils  daily. (Patient taking differently: Place 1 spray into both nostrils daily as needed for allergies. )  . gabapentin (NEURONTIN) 300 MG capsule Take 1 capsule (300 mg total) by mouth 3 (three) times daily.  . iron polysaccharides (FERREX 150) 150 MG capsule Take 1 capsule (150 mg total) by mouth 2 (two) times daily.  . nitrofurantoin, macrocrystal-monohydrate, (MACROBID) 100 MG capsule Take 1 capsule (100 mg total) by mouth 2 (two) times daily.  . polyethylene glycol (MIRALAX / GLYCOLAX) packet Take 17 g by mouth daily. (Patient not taking: Reported on 12/06/2018)  .  polyvinyl alcohol (LIQUIFILM TEARS) 1.4 % ophthalmic solution Place 1 drop into both eyes as needed for dry eyes.   No facility-administered encounter medications on file as of 12/06/2018.      Teodoro Spray, NP

## 2018-12-06 NOTE — Telephone Encounter (Signed)
Jenn notified of Dr. Marliss Coots comments

## 2018-12-07 ENCOUNTER — Ambulatory Visit: Payer: Self-pay | Admitting: Family Medicine

## 2018-12-07 ENCOUNTER — Telehealth: Payer: Self-pay | Admitting: Family Medicine

## 2018-12-07 NOTE — Telephone Encounter (Signed)
I spoke to patient's son, Ronalee Belts. Patient has hospice coming to the home to check on patient.  Mike's very happy with Hospice.  Ronalee Belts said he'll talk with the hospice nurse to find out if she feels patient needs to see Dr.Tower.  Ronalee Belts will call back if he feels patient needs an appointment.

## 2018-12-07 NOTE — Telephone Encounter (Signed)
Pt is needing an ED Follow up within the next week with Dr Glori Bickers.  Pt discharged from [facility] on 12/06/2018  Please advise given the reason for admission if you would like this visit In-Office or Virtual Pt had recent fall, dx with nondisplaced fracture of pubic body, all others scans were NL, no acute findings.

## 2018-12-07 NOTE — Telephone Encounter (Signed)
Virtual (or phone) is ok if family prefers it

## 2018-12-07 NOTE — Telephone Encounter (Signed)
That sounds fine to me  Thanks

## 2018-12-19 ENCOUNTER — Ambulatory Visit: Payer: Medicare Other

## 2019-01-09 ENCOUNTER — Other Ambulatory Visit: Payer: Self-pay

## 2019-01-09 MED ORDER — ACETAMINOPHEN 500 MG PO TABS: 500.0000 mg | ORAL_TABLET | Freq: Three times a day (TID) | ORAL | 5 refills | Status: AC | PRN

## 2019-01-09 MED ORDER — POLYETHYLENE GLYCOL 3350 17 G PO PACK: 17.0000 g | PACK | Freq: Every day | ORAL | 5 refills | Status: AC

## 2019-01-09 MED ORDER — ASPIRIN 81 MG PO CHEW: 81.0000 mg | CHEWABLE_TABLET | Freq: Two times a day (BID) | ORAL | 5 refills | Status: AC | PRN

## 2019-01-09 NOTE — Telephone Encounter (Signed)
Rickey Barbara R with [facility] left v/m requesting refill of ASA 81 mg,Tylenol 500 mg and Miralax to [facility]. Renee request cb when done.

## 2019-02-06 DEATH — deceased

## 2019-02-08 ENCOUNTER — Encounter: Payer: Self-pay | Admitting: Family Medicine

## 2019-02-08 DIAGNOSIS — R627 Adult failure to thrive: Secondary | ICD-10-CM | POA: Insufficient documentation

## 2020-01-29 IMAGING — DX DG THORACIC SPINE 3V
3 series · 3 of 3 positions shown · non-contrast
Comparison: 02/13/2018.

CLINICAL DATA: Chronic midline thoracic back pain.

EXAM:
THORACIC SPINE - 3 VIEWS

[t-spine ap]
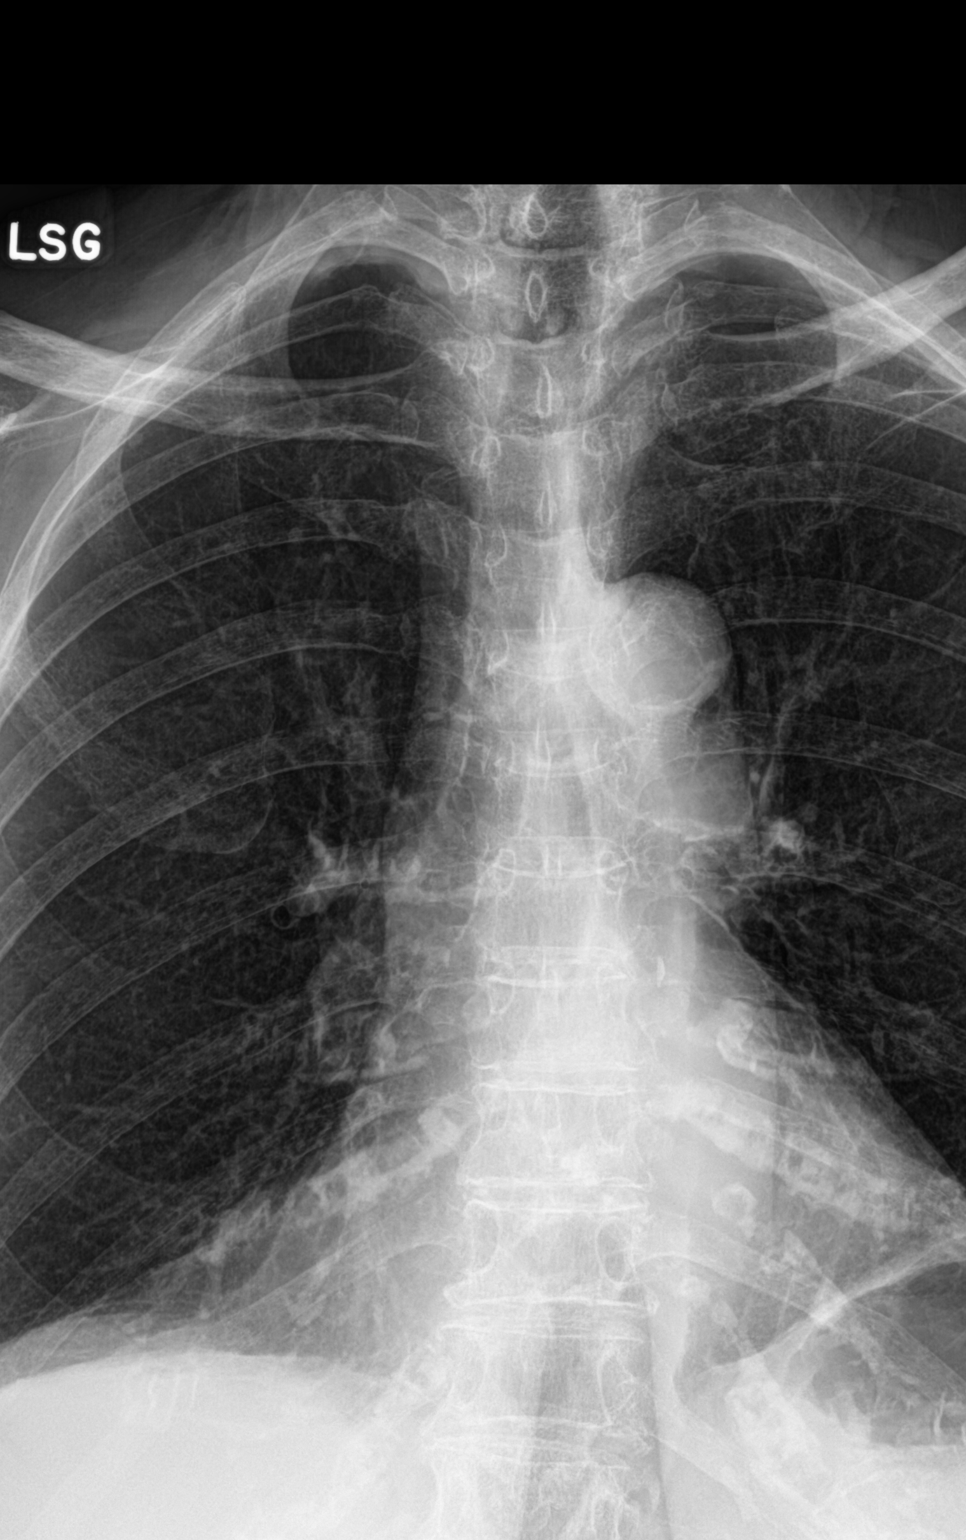

[t-spine lat]
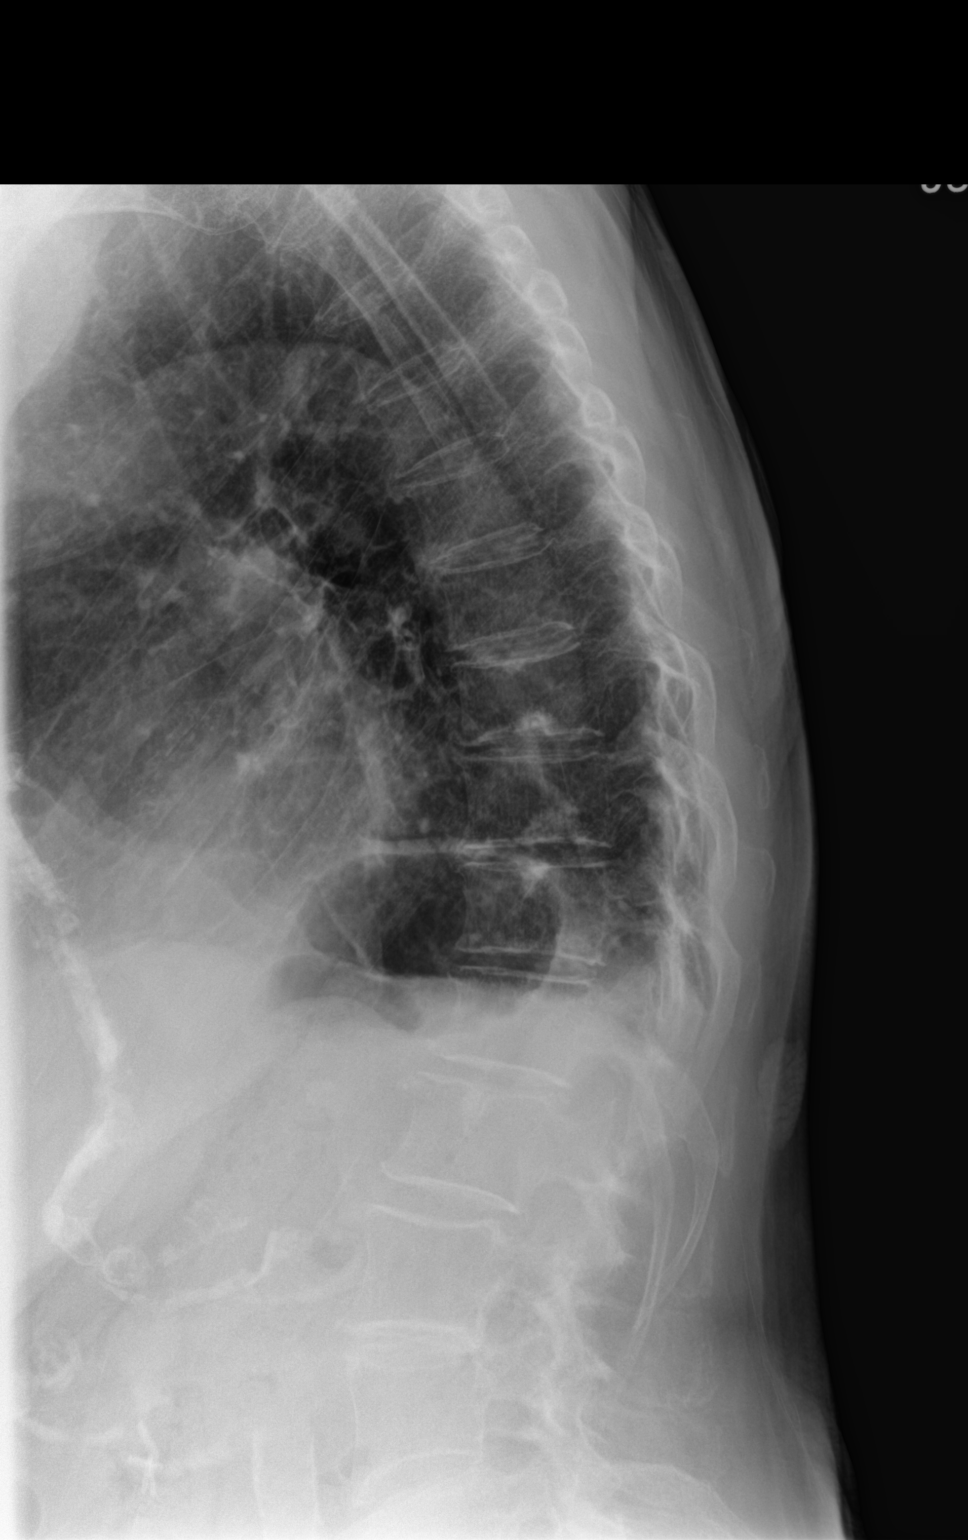

[t-spine swimmers]
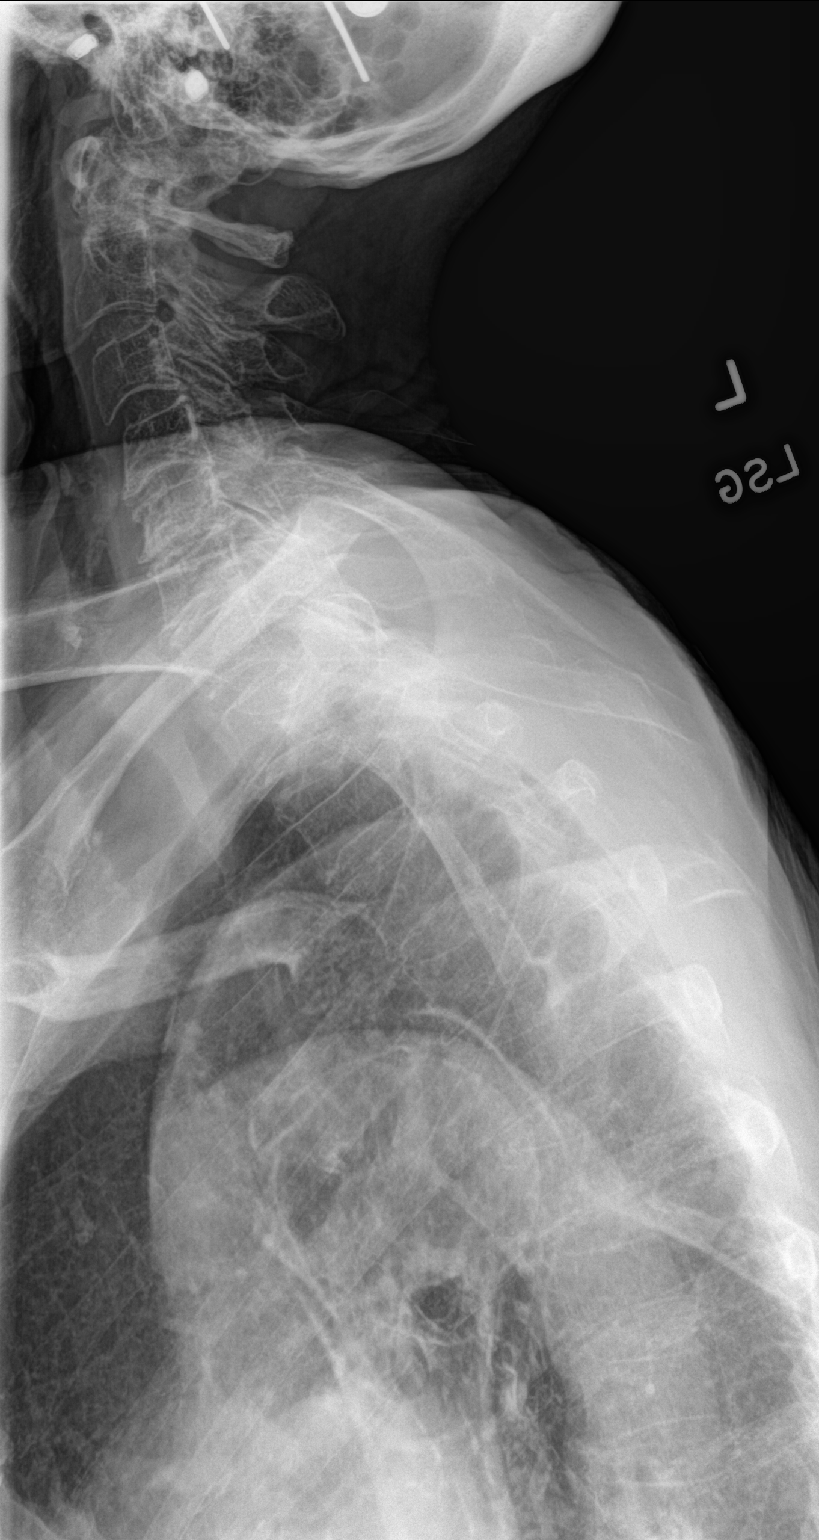

[3 of 3 positions shown; findings below may reference images not displayed]

FINDINGS: Diffuse osteopenia. Mild kyphoscoliosis. Diffuse multilevel
degenerative change noted about the cervicothoracic and
thoracolumbar spine. No acute bony abnormality. Aortic
atherosclerotic vascular calcification.
IMPRESSION: 1. Diffuse osteopenia. Mild kyphoscoliosis. Diffuse multilevel
degenerative change noted about the cervicothoracic and
thoracolumbar spine.

2.  Aortic atherosclerotic vascular disease.

## 2020-02-08 IMAGING — CT CT HEAD W/O CM
4 series · 16 of 47 positions shown, 18 images · non-contrast
Comparison: None.

CLINICAL DATA: Fall.  Head injury

EXAM:
CT HEAD WITHOUT CONTRAST
TECHNIQUE: Contiguous axial images were obtained from the base of the skull
through the vertex without intravenous contrast.

[Series 2: head wo · axial · 0.39mm/px · z∈[-325,-215]mm · 7 of 30 slices shown, 9 images]
[im 4/30  brain]
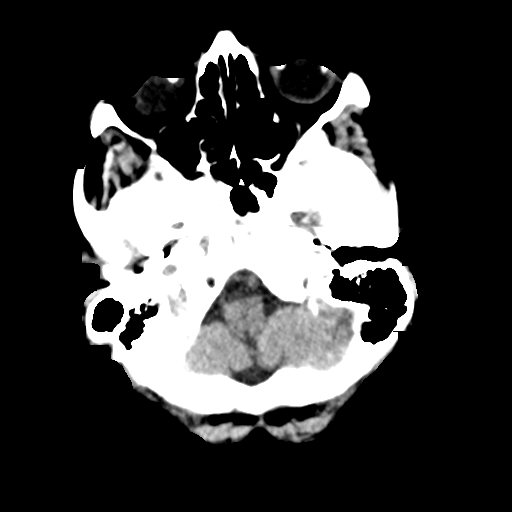
[im 4/30  bone]
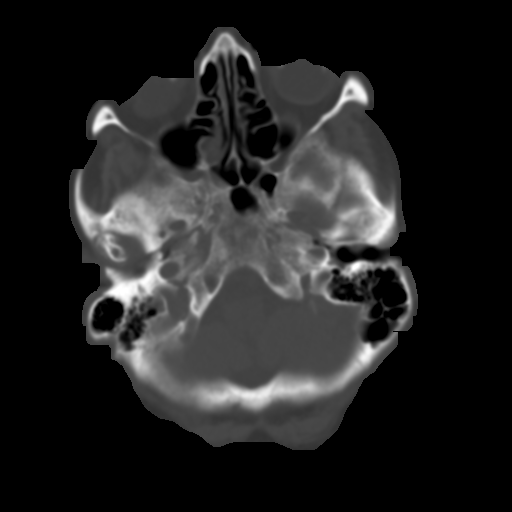
[im 8/30  brain]
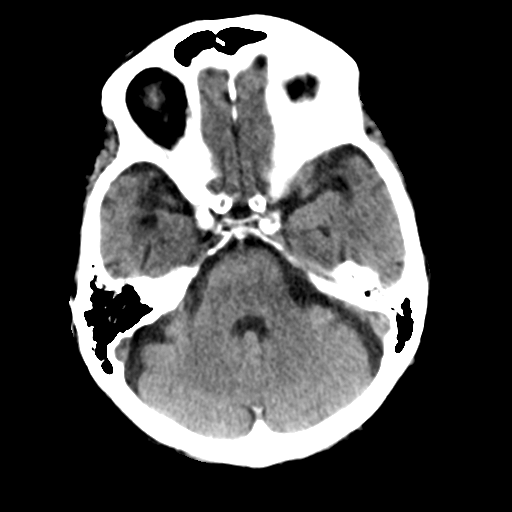
[im 11/30  brain]
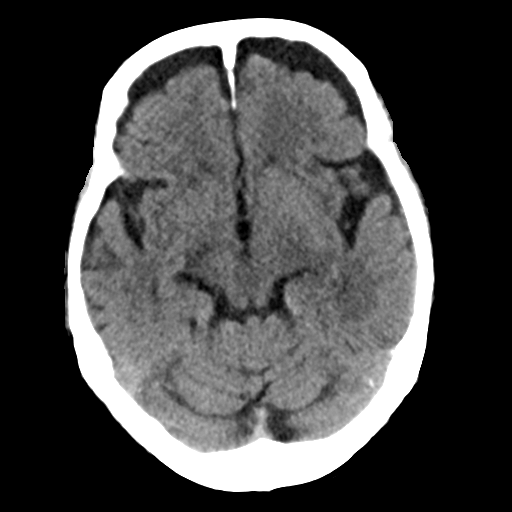
[im 15/30  brain]
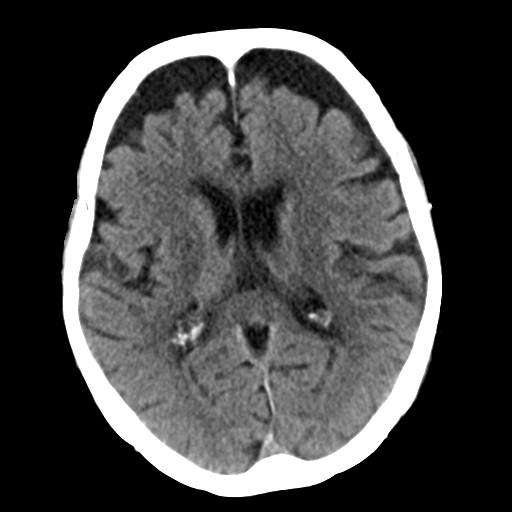
[im 19/30  brain]
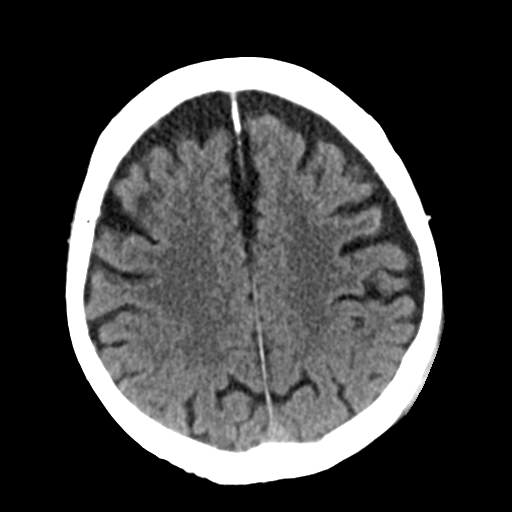
[im 19/30  bone]
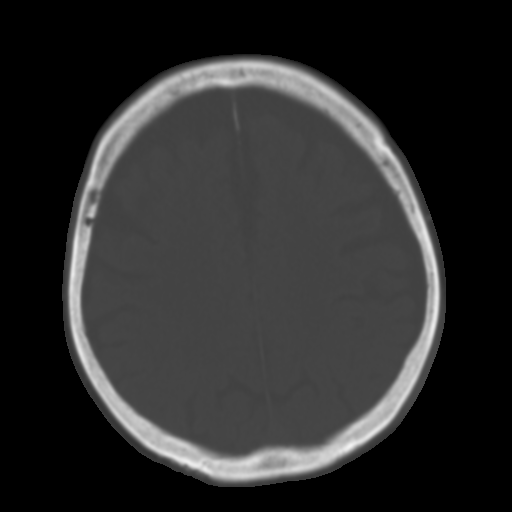
[im 22/30  brain]
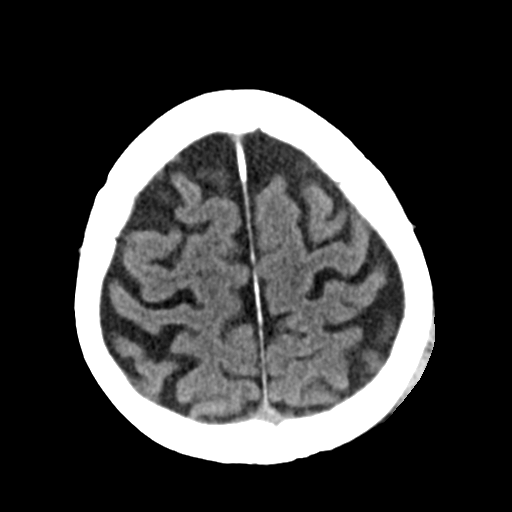
[im 26/30  brain]
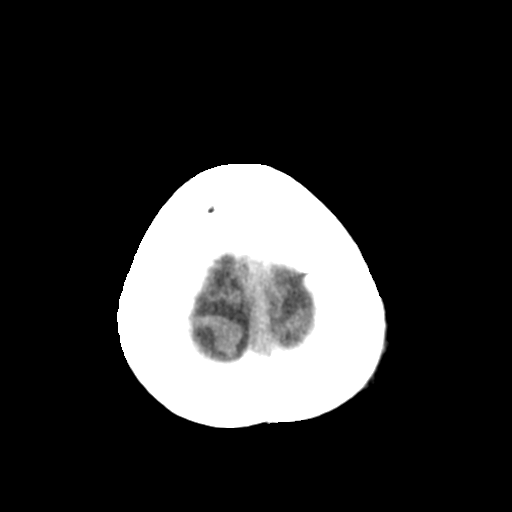

[Series 3: head bone · axial · 0.39mm/px · z∈[-326,-296]mm · 3 of 75 slices shown]
[im 8/75  bone]
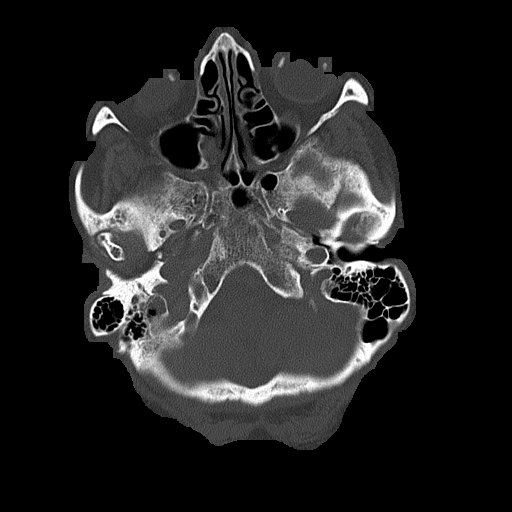
[im 15/75  bone]
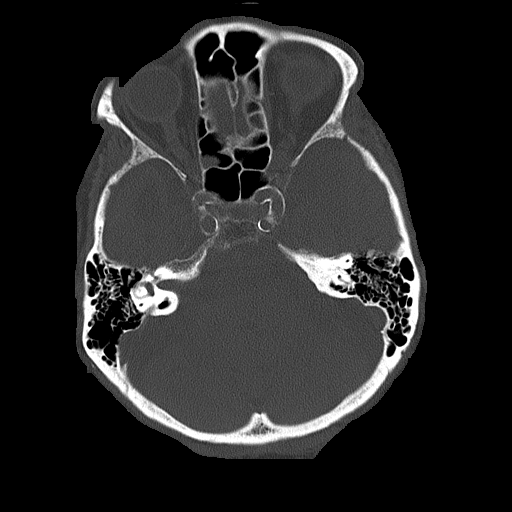
[im 23/75  bone]
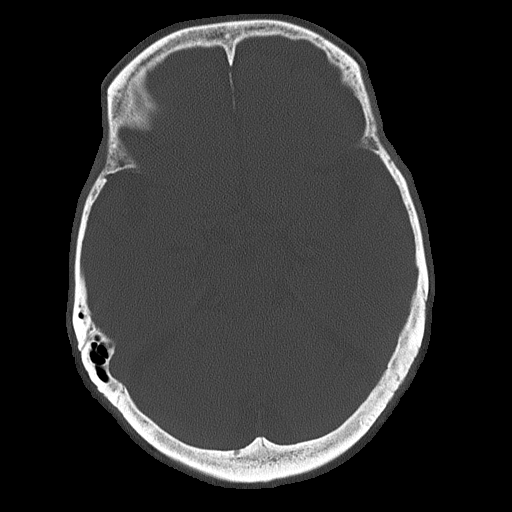

[Series 4: coronal soft tissue · coronal · 0.29mm/px · 3 of 67 slices shown]
[im 23/67  brain]
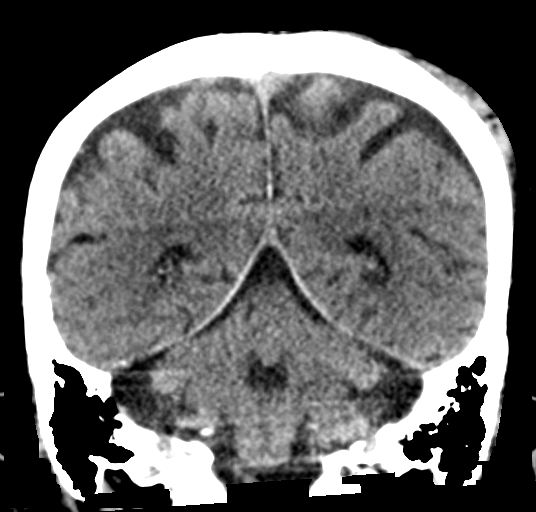
[im 30/67  brain]
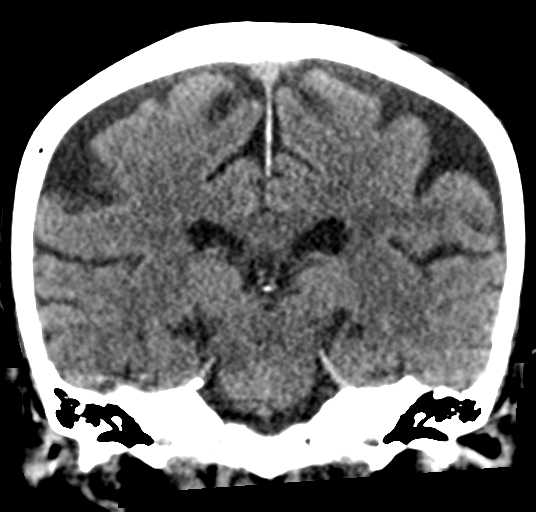
[im 37/67  brain]
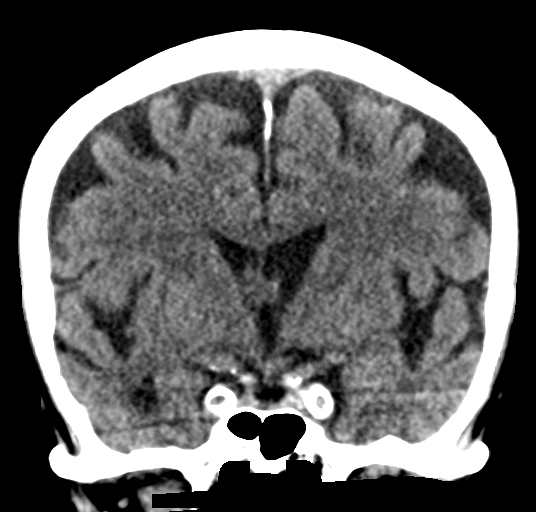

[Series 5: sagittal soft tissue · sagittal · 0.29mm/px · 3 of 52 slices shown]
[im 18/52  brain]
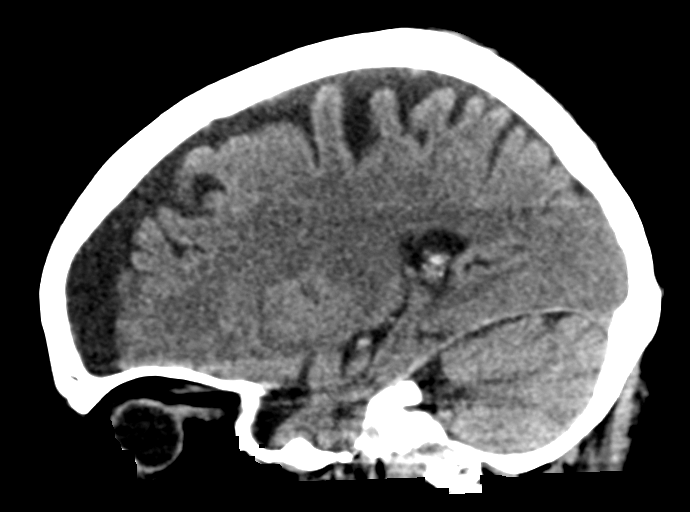
[im 26/52  brain]
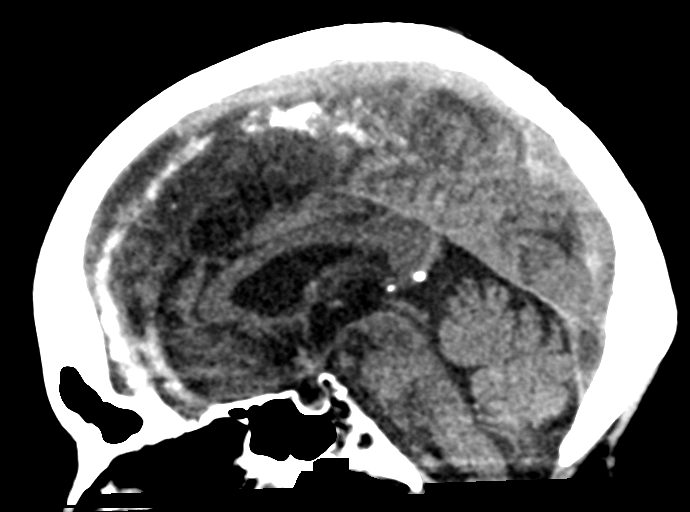
[im 35/52  brain]
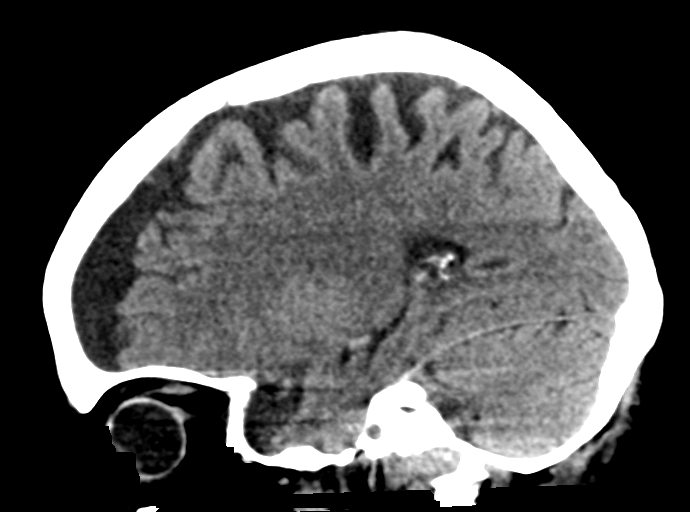

[16 of 47 positions shown; findings below may reference images not displayed]

FINDINGS: Brain: Generalized atrophy. Moderately large bifrontal subdural
hygromas. No acute hemorrhage or mass. Mild chronic ischemic change.
No acute infarct. No midline shift.

Vascular: Arterial calcification.  Negative for hyperdense vessel

Skull: Negative for skull fracture.  Left parietal scalp hematoma

Sinuses/Orbits: Mild mucosal edema paranasal sinuses. Bilateral
ocular surgery.

Other: None
IMPRESSION: No acute intracranial abnormality

Left parietal scalp hematoma
